# Patient Record
Sex: Male | Born: 1945 | Race: White | Hispanic: No | Marital: Married | Smoking: Never smoker
Health system: Southern US, Community
[De-identification: ages and names within clinical notes are randomized; demographics above are authoritative.]

## PROBLEM LIST (undated history)

## (undated) DIAGNOSIS — R569 Unspecified convulsions: Secondary | ICD-10-CM

## (undated) DIAGNOSIS — U071 COVID-19: Secondary | ICD-10-CM

## (undated) DIAGNOSIS — F0280 Dementia in other diseases classified elsewhere without behavioral disturbance: Secondary | ICD-10-CM

## (undated) DIAGNOSIS — I10 Essential (primary) hypertension: Secondary | ICD-10-CM

## (undated) DIAGNOSIS — E7800 Pure hypercholesterolemia, unspecified: Secondary | ICD-10-CM

## (undated) HISTORY — PX: MANDIBLE SURGERY: SHX707

## (undated) HISTORY — PX: KNEE SURGERY: SHX244

---

## 2020-04-25 ENCOUNTER — Other Ambulatory Visit: Payer: Self-pay

## 2020-04-25 ENCOUNTER — Encounter (HOSPITAL_COMMUNITY): Payer: Self-pay | Admitting: Emergency Medicine

## 2020-04-25 ENCOUNTER — Inpatient Hospital Stay (HOSPITAL_COMMUNITY)
Admission: EM | Admit: 2020-04-25 | Discharge: 2020-04-29 | DRG: 177 | Disposition: A | Discharge status: Home / Self Care | Payer: Medicare Other | Attending: Internal Medicine | Admitting: Internal Medicine

## 2020-04-25 ENCOUNTER — Emergency Department (HOSPITAL_COMMUNITY): Payer: Medicare Other

## 2020-04-25 DIAGNOSIS — G929 Unspecified toxic encephalopathy: Secondary | ICD-10-CM | POA: Diagnosis present

## 2020-04-25 DIAGNOSIS — E860 Dehydration: Secondary | ICD-10-CM | POA: Diagnosis present

## 2020-04-25 DIAGNOSIS — U071 COVID-19: Secondary | ICD-10-CM | POA: Diagnosis present

## 2020-04-25 DIAGNOSIS — F0280 Dementia in other diseases classified elsewhere without behavioral disturbance: Secondary | ICD-10-CM | POA: Diagnosis present

## 2020-04-25 DIAGNOSIS — J1282 Pneumonia due to coronavirus disease 2019: Secondary | ICD-10-CM | POA: Diagnosis present

## 2020-04-25 DIAGNOSIS — N179 Acute kidney failure, unspecified: Secondary | ICD-10-CM | POA: Diagnosis present

## 2020-04-25 DIAGNOSIS — J9601 Acute respiratory failure with hypoxia: Secondary | ICD-10-CM

## 2020-04-25 DIAGNOSIS — I10 Essential (primary) hypertension: Secondary | ICD-10-CM | POA: Diagnosis present

## 2020-04-25 DIAGNOSIS — R0602 Shortness of breath: Secondary | ICD-10-CM

## 2020-04-25 DIAGNOSIS — G309 Alzheimer's disease, unspecified: Secondary | ICD-10-CM | POA: Diagnosis present

## 2020-04-25 DIAGNOSIS — E7800 Pure hypercholesterolemia, unspecified: Secondary | ICD-10-CM | POA: Diagnosis present

## 2020-04-25 DIAGNOSIS — J159 Unspecified bacterial pneumonia: Secondary | ICD-10-CM | POA: Diagnosis present

## 2020-04-25 DIAGNOSIS — Z66 Do not resuscitate: Secondary | ICD-10-CM | POA: Diagnosis present

## 2020-04-25 HISTORY — DX: COVID-19: U07.1

## 2020-04-25 HISTORY — DX: Essential (primary) hypertension: I10

## 2020-04-25 HISTORY — DX: Dementia in other diseases classified elsewhere, unspecified severity, without behavioral disturbance, psychotic disturbance, mood disturbance, and anxiety: F02.80

## 2020-04-25 HISTORY — DX: Pure hypercholesterolemia, unspecified: E78.00

## 2020-04-25 LAB — CBC WITH DIFFERENTIAL/PLATELET
Abs Immature Granulocytes: 0.04 10*3/uL (ref 0.00–0.07)
Basophils Absolute: 0 10*3/uL (ref 0.0–0.1)
Basophils Relative: 0 %
Eosinophils Absolute: 0.1 10*3/uL (ref 0.0–0.5)
Eosinophils Relative: 1 %
HCT: 42.7 % (ref 39.0–52.0)
Hemoglobin: 14.2 g/dL (ref 13.0–17.0)
Immature Granulocytes: 0 %
Lymphocytes Relative: 7 %
Lymphs Abs: 0.7 10*3/uL (ref 0.7–4.0)
MCH: 29.9 pg (ref 26.0–34.0)
MCHC: 33.3 g/dL (ref 30.0–36.0)
MCV: 89.9 fL (ref 80.0–100.0)
Monocytes Absolute: 1.1 10*3/uL — ABNORMAL HIGH (ref 0.1–1.0)
Monocytes Relative: 12 %
Neutro Abs: 7.4 10*3/uL (ref 1.7–7.7)
Neutrophils Relative %: 80 %
Platelets: 179 10*3/uL (ref 150–400)
RBC: 4.75 MIL/uL (ref 4.22–5.81)
RDW: 13.2 % (ref 11.5–15.5)
WBC: 9.4 10*3/uL (ref 4.0–10.5)
nRBC: 0 % (ref 0.0–0.2)

## 2020-04-25 LAB — COMPREHENSIVE METABOLIC PANEL
ALT: 31 U/L (ref 0–44)
AST: 29 U/L (ref 15–41)
Albumin: 3.7 g/dL (ref 3.5–5.0)
Alkaline Phosphatase: 59 U/L (ref 38–126)
Anion gap: 8 (ref 5–15)
BUN: 16 mg/dL (ref 8–23)
CO2: 25 mmol/L (ref 22–32)
Calcium: 8.6 mg/dL — ABNORMAL LOW (ref 8.9–10.3)
Chloride: 102 mmol/L (ref 98–111)
Creatinine, Ser: 1.57 mg/dL — ABNORMAL HIGH (ref 0.61–1.24)
GFR, Estimated: 46 mL/min — ABNORMAL LOW (ref >60)
Glucose, Bld: 113 mg/dL — ABNORMAL HIGH (ref 70–99)
Potassium: 3.6 mmol/L (ref 3.5–5.1)
Sodium: 135 mmol/L (ref 135–145)
Total Bilirubin: 1.4 mg/dL — ABNORMAL HIGH (ref 0.3–1.2)
Total Protein: 6.2 g/dL — ABNORMAL LOW (ref 6.5–8.1)

## 2020-04-25 MED ORDER — ACETAMINOPHEN 650 MG RE SUPP
650.0000 mg | Freq: Four times a day (QID) | RECTAL | Status: DC | PRN
Start: 2020-04-25 — End: 2020-04-29

## 2020-04-25 MED ORDER — SODIUM CHLORIDE 0.9 % IV BOLUS
1000.0000 mL | Freq: Once | INTRAVENOUS | Status: AC
  Administered 2020-04-25: 1000 mL via INTRAVENOUS

## 2020-04-25 MED ORDER — ENOXAPARIN SODIUM 40 MG/0.4ML ~~LOC~~ SOLN
40.0000 mg | SUBCUTANEOUS | Status: DC
  Administered 2020-04-26 – 2020-04-29 (×4): 40 mg via SUBCUTANEOUS
  Filled 2020-04-25 (×5): qty 0.4

## 2020-04-25 MED ORDER — DEXAMETHASONE SODIUM PHOSPHATE 10 MG/ML IJ SOLN
10.0000 mg | Freq: Once | INTRAMUSCULAR | Status: AC
  Administered 2020-04-26: 10 mg via INTRAVENOUS
  Filled 2020-04-25: qty 1

## 2020-04-25 MED ORDER — DEXAMETHASONE 4 MG PO TABS
6.0000 mg | ORAL_TABLET | ORAL | Status: DC
  Administered 2020-04-26 – 2020-04-29 (×4): 6 mg via ORAL
  Filled 2020-04-25 (×4): qty 2

## 2020-04-25 MED ORDER — ACETAMINOPHEN 325 MG PO TABS
650.0000 mg | ORAL_TABLET | Freq: Four times a day (QID) | ORAL | Status: DC | PRN
  Administered 2020-04-26: 650 mg via ORAL
  Filled 2020-04-25: qty 2

## 2020-04-25 NOTE — H&P (Signed)
History and Physical    Jonathan Berry MGQ:[id number] DOB: [birth year] DOA: 04/25/2020  PCP: Pcp, No  Patient coming from: Home.  Chief Complaint: Cough and confusion.  History obtained from patient's wife.  HPI: Jonathan Berry is a 75 y.o. male with history of hypertension, hyperlipidemia, dementia was brought to the ER after patient was becoming increasingly confused weak and increasing cough.  Patient symptoms started about 2 days ago with cough which progressively worsened start developing some hypoxia.  Patient also had mild fever with temperatures around 99 F.  Yesterday patient was checked for Covid test by patient's daughter at home and was positive.  Patient had visit with primary care who started patient on Paxlovid first dose of which was taken this morning.  Following which patient has become gradually more weak had 1 episode nausea vomiting and becoming more confused at this point family decided to bring patient to the hospital.  ED Course: In the ER patient appeared confused chest x-ray shows bilateral infiltrates consistent with pneumonia concerning for Covid infection.  Covid test is still pending and we do not have any documentation from the outpatient test.  Labs are significant for creatinine of 1.5 which increased from being normal about September 2021 in the Care Everywhere.  CRP is 14.3 procalcitonin is 18.9 and D-dimer is 1.8.  Patient was empirically started on Decadron remdesivir and also we will keep patient antibiotics since procalcitonin is elevated patient was given fluid bolus because of low normal blood pressure and acute renal failure.  Review of Systems: As per HPI, rest all negative.   Past Medical History:  Diagnosis Date  . Alzheimer disease (HCC)   . COVID   . Hypercholesteremia   . Hypertension     Past Surgical History:  Procedure Laterality Date  . KNEE SURGERY    . MANDIBLE SURGERY       reports that he has never smoked. He has never used smokeless  tobacco. He reports current alcohol use. He reports that he does not use drugs.  No Known Allergies  Family History  Problem Relation Age of Onset  . Diabetes Mellitus II Neg Hx     Prior to Admission medications   Not on File    Physical Exam: Constitutional: Moderately built and nourished. Vitals:   04/25/20 2223 04/25/20 2224 04/25/20 2225 04/25/20 2230  BP:    92/66  Pulse: 77 74 80 76  Resp:    16  Temp:      TempSrc:      SpO2: (!) 89% (!) 89% (!) 87% 91%  Weight:      Height:       Eyes: Anicteric no pallor. ENMT: No discharge from the ears eyes nose or mouth. Neck: No mass felt.  No neck rigidity. Respiratory: No rhonchi or crepitations. Cardiovascular: S1-S2 heard. Abdomen: Soft nontender bowel sounds present. Musculoskeletal: No edema. Skin: No rash. Neurologic: Alert awake oriented to his name moving all extremities. Psychiatric: Appears confused.   Labs on Admission: I have personally reviewed following labs and imaging studies  CBC: Recent Labs  Lab 04/25/20 2045  WBC 9.4  NEUTROABS 7.4  HGB 14.2  HCT 42.7  MCV 89.9  PLT 179   Basic Metabolic Panel: Recent Labs  Lab 04/25/20 2045  NA 135  K 3.6  CL 102  CO2 25  GLUCOSE 113*  BUN 16  CREATININE 1.57*  CALCIUM 8.6*   GFR: Estimated Creatinine Clearance: 41.3 mL/min (A) (by C-G formula based on  SCr of 1.57 mg/dL (H)). Liver Function Tests: Recent Labs  Lab 04/25/20 2045  AST 29  ALT 31  ALKPHOS 59  BILITOT 1.4*  PROT 6.2*  ALBUMIN 3.7   No results for input(s): LIPASE, AMYLASE in the last 168 hours. No results for input(s): AMMONIA in the last 168 hours. Coagulation Profile: No results for input(s): INR, PROTIME in the last 168 hours. Cardiac Enzymes: No results for input(s): CKTOTAL, CKMB, CKMBINDEX, TROPONINI in the last 168 hours. BNP (last 3 results) No results for input(s): PROBNP in the last 8760 hours. HbA1C: No results for input(s): HGBA1C in the last 72  hours. CBG: No results for input(s): GLUCAP in the last 168 hours. Lipid Profile: No results for input(s): CHOL, HDL, LDLCALC, TRIG, CHOLHDL, LDLDIRECT in the last 72 hours. Thyroid Function Tests: No results for input(s): TSH, T4TOTAL, FREET4, T3FREE, THYROIDAB in the last 72 hours. Anemia Panel: No results for input(s): VITAMINB12, FOLATE, FERRITIN, TIBC, IRON, RETICCTPCT in the last 72 hours. Urine analysis: No results found for: COLORURINE, APPEARANCEUR, LABSPEC, PHURINE, GLUCOSEU, HGBUR, BILIRUBINUR, KETONESUR, PROTEINUR, UROBILINOGEN, NITRITE, LEUKOCYTESUR Sepsis Labs: @LABRCNTIP (procalcitonin:4,lacticidven:4) )No results found for this or any previous visit (from the past 240 hour(s)).   Radiological Exams on Admission: DG Chest Portable 1 View  Result Date: 04/25/2020 CLINICAL DATA:  Cough and fever.  COVID positive yesterday. EXAM: PORTABLE CHEST 1 VIEW COMPARISON:  None. FINDINGS: Low lung volumes. Patchy heterogeneous bilateral airspace opacities in a mid-lower lung zone predominant distribution. Heart is normal in size with normal mediastinal contours. Aortic atherosclerosis. No pleural effusion, pneumothorax, or evidence of pneumomediastinum. No acute osseous abnormalities are seen. IMPRESSION: Patchy heterogeneous bilateral airspace opacities in a mid-lower lung zone predominant distribution, pattern typical of COVID-19. Electronically Signed   By: 04/27/2020 M.D.   On: 04/25/2020 20:24    EKG: Independently reviewed.  Normal sinus rhythm.  Assessment/Plan Principal Problem:   Pneumonia due to COVID-19 virus Active Problems:   ARF (acute renal failure) (HCC)   DNR (do not resuscitate)   Essential hypertension    1. Pneumonia likely from Covid Covid test is still pending but as per the family outpatient test was positive.  Patient chest x-ray shows bilateral infiltrates for which patient has been already started on Decadron and remdesivir.  Presently oxygen sats are  more than 90% on room air but sometimes desats.  Since patient's procalcitonin is also elevated we will keep patient on empiric antibiotics.  Continue to closely monitor.  Follow inflammatory markers and respiratory status.  Discussed with patient's wife that if patient's oxygen saturation worsens may need Actemra. 2. Acute renal failure likely from dehydration low normal blood pressure will hold off patient's lisinopril for now and we will closely monitor intake output metabolic panel patient did receive 1 L fluid bolus in the ER. 3. Hypertension blood pressures in the low normal's will hold antihypertensives due to low normal blood pressure and also acute renal failure. 4. History of Alzheimer's dementia with encephalopathy likely from Covid infection/pneumonia and metabolic reasons.  Presently on Aricept.  Per family patient symptoms have improved after receiving fluids in the ER.  Since patient has bilateral pneumonia with acute renal failure encephalopathy will need close monitoring for any further worsening in inpatient status.   DVT prophylaxis: Lovenox. Code Status: DNR confirmed with patient's wife. Family Communication: Patient's wife. Disposition Plan: Home. Consults called: None. Admission status: Inpatient.   04/27/2020 MD [facility] Pager [phone].  If 7PM-7AM, please contact night-coverage  [email or web address] Password [facility]  04/25/2020, 11:27 PM

## 2020-04-25 NOTE — ED Triage Notes (Signed)
Patient tested positive for Covid19 yesterday , dry cough with fever and emesis this week . He completed his Covid vaccination with booster , 1st dose of oral Paxlovid tab this morning . Respirations unlabored .

## 2020-04-25 NOTE — ED Provider Notes (Signed)
[facility] EMERGENCY DEPARTMENT Provider Note   CSN: [id number] Arrival date & time: 04/25/20  1937     History Chief Complaint  Patient presents with  . Covid Fever    Dry Cough    Jonathan Berry is a 75 y.o. male.  74 yo M with a chief complaints of confusion fever and cough.  This been going on for a few days now.  Patient had a home test for COVID that was positive.  Has been eating and drinking some but does not seem to drink a lot at baseline.  Fever as high as 104 yesterday.  Seems to have times where he is better and sometimes when he is worse.  Oxygen checked at home no lower than 91.  Has had some difficulty exerting himself.  Has Alzheimer's and so is not able to provide much personal history.  Level 5 caveat.        Past Medical History:  Diagnosis Date  . Alzheimer disease (HCC)   . COVID   . Hypercholesteremia   . Hypertension     Patient Active Problem List   Diagnosis Date Noted  . Pneumonia due to COVID-19 virus 04/25/2020  . ARF (acute renal failure) (HCC) 04/25/2020  . DNR (do not resuscitate) 04/25/2020  . Essential hypertension 04/25/2020    Past Surgical History:  Procedure Laterality Date  . KNEE SURGERY    . MANDIBLE SURGERY         Family History  Problem Relation Age of Onset  . Diabetes Mellitus II Neg Hx     Social History   Tobacco Use  . Smoking status: Never Smoker  . Smokeless tobacco: Never Used  Substance Use Topics  . Alcohol use: Yes    Comment: 3 glasses of wine a week  . Drug use: Never    Home Medications Prior to Admission medications   Medication Sig Start Date End Date Taking? Authorizing Provider  acetaminophen (TYLENOL) 500 MG tablet Take 1,000 mg by mouth every 6 (six) hours as needed for fever.   Yes [provider]  Acetylcarnitine HCl (ACETYL-L-CARNITINE HCL) POWD 1 capsule by Does not apply route daily.   Yes [provider]  Alpha-Lipoic Acid 100 MG TABS Take 300 mg  by mouth daily.   Yes [provider]  atorvastatin (LIPITOR) 20 MG tablet Take 20 mg by mouth daily. 12/02/19  Yes [provider]  Cholecalciferol (VITAMIN D3) 125 MCG (5000 UT) TABS Take 5,000 Units by mouth daily.   Yes [provider]  donepezil (ARICEPT) 10 MG tablet Take 10 mg by mouth daily. 03/27/20  Yes [provider]  ibuprofen (ADVIL) 200 MG tablet Take 400 mg by mouth every 6 (six) hours as needed for fever.   Yes [provider]  L-METHYLFOLATE PO Take 1 tablet by mouth daily.   Yes [provider]  lisinopril (ZESTRIL) 5 MG tablet Take 5 mg by mouth daily. 12/02/19  Yes [provider]  Multiple Vitamins-Minerals (CENTRUM SILVER PO) Take 1 tablet by mouth daily.   Yes [provider]  Nirmatrelvir & Ritonavir (PAXLOVID) 20 x 150 MG & 10 x 100MG  TBPK Take 3 tablets by mouth See admin instructions. Bid x 5 days 04/25/20 04/30/20 Yes [provider]  OVER THE COUNTER MEDICATION Take 1 tablet by mouth daily. Biomethyl folate complex   Yes [provider]  OVER THE COUNTER MEDICATION Take 1 tablet by mouth daily. Circumin complex   Yes  [provider]  OVER THE COUNTER MEDICATION Take 1 drop by mouth daily. B12 methylcobalamin 1,000 liquid   Yes [provider]    Allergies    Patient has no known allergies.  Review of Systems   Review of Systems  Constitutional: Positive for chills and fever.  HENT: Negative for congestion and facial swelling.   Eyes: Negative for discharge and visual disturbance.  Respiratory: Positive for cough and shortness of breath.   Cardiovascular: Negative for chest pain and palpitations.  Gastrointestinal: Positive for nausea and vomiting. Negative for abdominal pain and diarrhea.  Musculoskeletal: Negative for arthralgias and myalgias.  Skin: Negative for color change and rash.  Neurological: Negative for tremors, syncope and headaches.   Psychiatric/Behavioral: Negative for confusion and dysphoric mood.    Physical Exam Updated Vital Signs BP 116/67   Pulse 70   Temp 98.6 F (37 C) (Oral)   Resp 19   Ht  (1.753 m)   Wt 75 kg   SpO2 95%   BMI 24.42 kg/m   Physical Exam Vitals and nursing note reviewed.  Constitutional:      Appearance: He is well-developed.  HENT:     Head: Normocephalic and atraumatic.  Eyes:     Pupils: Pupils are equal, round, and reactive to light.  Neck:     Vascular: No JVD.  Cardiovascular:     Rate and Rhythm: Normal rate and regular rhythm.     Heart sounds: No murmur heard. No friction rub. No gallop.   Pulmonary:     Effort: No respiratory distress.     Breath sounds: No wheezing.     Comments: Coarse breath sounds in all fields Abdominal:     General: There is no distension.     Tenderness: There is no abdominal tenderness. There is no guarding or rebound.  Musculoskeletal:        General: Normal range of motion.     Cervical back: Normal range of motion and neck supple.  Skin:    Coloration: Skin is not pale.     Findings: No rash.  Neurological:     Mental Status: He is alert and oriented to person, place, and time.  Psychiatric:        Behavior: Behavior normal.     ED Results / Procedures / Treatments   Labs (all labs ordered are listed, but only abnormal results are displayed) Labs Reviewed  RESP PANEL BY RT-PCR (FLU A&B, COVID) ARPGX2 - Abnormal; Notable for the following components:      Result Value   SARS Coronavirus 2 by RT PCR POSITIVE (*)    All other components within normal limits  CBC WITH DIFFERENTIAL/PLATELET - Abnormal; Notable for the following components:   Monocytes Absolute 1.1 (*)    All other components within normal limits  COMPREHENSIVE METABOLIC PANEL - Abnormal; Notable for the following components:   Glucose, Bld 113 (*)    Creatinine, Ser 1.57 (*)    Calcium 8.6 (*)    Total Protein 6.2 (*)    Total Bilirubin 1.4 (*)     GFR, Estimated 46 (*)    All other components within normal limits  D-DIMER, QUANTITATIVE - Abnormal; Notable for the following components:   D-Dimer, Quant 1.83 (*)    All other components within normal limits  LACTATE DEHYDROGENASE - Abnormal; Notable for the following components:   LDH 197 (*)    All other components within normal limits  FIBRINOGEN - Abnormal; Notable  for the following components:   Fibrinogen 543 (*)    All other components within normal limits  C-REACTIVE PROTEIN - Abnormal; Notable for the following components:   CRP 14.3 (*)    All other components within normal limits  COMPREHENSIVE METABOLIC PANEL - Abnormal; Notable for the following components:   Glucose, Bld 113 (*)    Creatinine, Ser 1.29 (*)    Calcium 8.6 (*)    Total Protein 6.0 (*)    Albumin 3.4 (*)    Total Bilirubin 1.4 (*)    GFR, Estimated 58 (*)    All other components within normal limits  C-REACTIVE PROTEIN - Abnormal; Notable for the following components:   CRP 16.0 (*)    All other components within normal limits  D-DIMER, QUANTITATIVE - Abnormal; Notable for the following components:   D-Dimer, Quant 2.27 (*)    All other components within normal limits  TROPONIN I (HIGH SENSITIVITY) - Abnormal; Notable for the following components:   Troponin I (High Sensitivity) 35 (*)    All other components within normal limits  TROPONIN I (HIGH SENSITIVITY) - Abnormal; Notable for the following components:   Troponin I (High Sensitivity) 33 (*)    All other components within normal limits  CULTURE, BLOOD (ROUTINE X 2)  CULTURE, BLOOD (ROUTINE X 2)  MRSA PCR SCREENING  URINALYSIS, ROUTINE W REFLEX MICROSCOPIC  LACTIC ACID, PLASMA  PROCALCITONIN  FERRITIN  TRIGLYCERIDES  CBC WITH DIFFERENTIAL/PLATELET  LACTIC ACID, PLASMA  HIV ANTIBODY (ROUTINE TESTING W REFLEX)  BRAIN NATRIURETIC PEPTIDE  MAGNESIUM  PROCALCITONIN    EKG None  Radiology DG Chest Port 1 View  Result Date:  04/26/2020 CLINICAL DATA:  Dyspnea, COVID pneumonia EXAM: PORTABLE CHEST 1 VIEW COMPARISON:  None. FINDINGS: Pulmonary insufflation is normal and symmetric. Left basilar focal pulmonary infiltrate has improved slightly in the interval. No pneumothorax or pleural effusion. Cardiac size within normal limits. Pulmonary vascularity is normal. IMPRESSION: Improving left basilar infiltrate Electronically Signed   By: Helyn Numbers MD   On: 04/26/2020 06:49   DG Chest Portable 1 View  Result Date: 04/25/2020 CLINICAL DATA:  Cough and fever.  COVID positive yesterday. EXAM: PORTABLE CHEST 1 VIEW COMPARISON:  None. FINDINGS: Low lung volumes. Patchy heterogeneous bilateral airspace opacities in a mid-lower lung zone predominant distribution. Heart is normal in size with normal mediastinal contours. Aortic atherosclerosis. No pleural effusion, pneumothorax, or evidence of pneumomediastinum. No acute osseous abnormalities are seen. IMPRESSION: Patchy heterogeneous bilateral airspace opacities in a mid-lower lung zone predominant distribution, pattern typical of COVID-19. Electronically Signed   By: Narda Rutherford M.D.   On: 04/25/2020 20:24    Procedures Procedures   Medications Ordered in ED Medications  enoxaparin (LOVENOX) injection 40 mg (40 mg Subcutaneous Given 04/26/20 1029)  acetaminophen (TYLENOL) tablet 650 mg (has no administration in time range)    Or  acetaminophen (TYLENOL) suppository 650 mg (has no administration in time range)  dexamethasone (DECADRON) tablet 6 mg (6 mg Oral Given 04/26/20 1023)  azithromycin (ZITHROMAX) 500 mg in sodium chloride 0.9 % 250 mL IVPB ( Intravenous Infusion Verify 04/26/20 0600)  donepezil (ARICEPT) tablet 10 mg (has no administration in time range)  remdesivir 200 mg in sodium chloride 0.9% 250 mL IVPB (has no administration in time range)    Followed by  remdesivir 100 mg in sodium chloride 0.9 % 100 mL IVPB (has no administration in time range)   haloperidol lactate (HALDOL) injection 5 mg (5 mg Intravenous Given  04/26/20 1026)  lactated ringers infusion (has no administration in time range)  atorvastatin (LIPITOR) tablet 20 mg (20 mg Oral Given 04/26/20 1158)  Nirmatrelvir & Ritonavir 20 x 150 MG & 10 x 100MG  TBPK 3 tablet (has no administration in time range)  aspirin chewable tablet 81 mg (81 mg Oral Given 04/26/20 1157)  magnesium sulfate IVPB 2 g 50 mL (has no administration in time range)  pantoprazole (PROTONIX) EC tablet 40 mg (40 mg Oral Given 04/26/20 1158)  Ampicillin-Sulbactam (UNASYN) 3 g in sodium chloride 0.9 % 100 mL IVPB (3 g Intravenous New Bag/Given 04/26/20 1157)  sodium chloride 0.9 % bolus 1,000 mL (0 mLs Intravenous Stopped 04/25/20 2328)  dexamethasone (DECADRON) injection 10 mg (10 mg Intravenous Given 04/26/20 0157)  potassium chloride SA (KLOR-CON) CR tablet 40 mEq (40 mEq Oral Given 04/26/20 1022)    ED Course  I have reviewed the triage vital signs and the nursing notes.  Pertinent labs & imaging results that were available during my care of the patient were reviewed by me and considered in my medical decision making (see chart for details).    MDM Rules/Calculators/A&P                          75 yo M with a chief complaints of cough fever and confusion.  Going on for a couple days now.  Had a positive outpatient test for COVID-19.  Was started on Paxlovid first 2 doses today.  Patient is hypertensive on arrival here.  We will give a bolus of IV fluids.  Check blood work.  Chest x-ray viewed by me consistent with multifocal pneumonia.  Hypoxic spontaneously at rest.  Given fluids with some improvement of BP.   Will discuss with Medicine.    CRITICAL CARE Performed by: 66   Total critical care time: 35 minutes  Critical care time was exclusive of separately billable procedures and treating other patients.  Critical care was necessary to treat or prevent imminent or  life-threatening deterioration.  Critical care was time spent personally by me on the following activities: development of treatment plan with patient and/or surrogate as well as nursing, discussions with consultants, evaluation of patient's response to treatment, examination of patient, obtaining history from patient or surrogate, ordering and performing treatments and interventions, ordering and review of laboratory studies, ordering and review of radiographic studies, pulse oximetry and re-evaluation of patient's condition.  The patients results and plan were reviewed and discussed.   Any x-rays performed were independently reviewed by myself.   Differential diagnosis were considered with the presenting HPI.  Medications  enoxaparin (LOVENOX) injection 40 mg (40 mg Subcutaneous Given 04/26/20 1029)  acetaminophen (TYLENOL) tablet 650 mg (has no administration in time range)    Or  acetaminophen (TYLENOL) suppository 650 mg (has no administration in time range)  dexamethasone (DECADRON) tablet 6 mg (6 mg Oral Given 04/26/20 1023)  azithromycin (ZITHROMAX) 500 mg in sodium chloride 0.9 % 250 mL IVPB ( Intravenous Infusion Verify 04/26/20 0600)  donepezil (ARICEPT) tablet 10 mg (has no administration in time range)  remdesivir 200 mg in sodium chloride 0.9% 250 mL IVPB (has no administration in time range)    Followed by  remdesivir 100 mg in sodium chloride 0.9 % 100 mL IVPB (has no administration in time range)  haloperidol lactate (HALDOL) injection 5 mg (5 mg Intravenous Given 04/26/20 1026)  lactated ringers infusion (has no administration in time range)  atorvastatin (LIPITOR) tablet 20 mg (20 mg Oral Given 04/26/20 1158)  Nirmatrelvir & Ritonavir 20 x 150 MG & 10 x 100MG  TBPK 3 tablet (has no administration in time range)  aspirin chewable tablet 81 mg (81 mg Oral Given 04/26/20 1157)  magnesium sulfate IVPB 2 g 50 mL (has no administration in time range)  pantoprazole (PROTONIX) EC tablet  40 mg (40 mg Oral Given 04/26/20 1158)  Ampicillin-Sulbactam (UNASYN) 3 g in sodium chloride 0.9 % 100 mL IVPB (3 g Intravenous New Bag/Given 04/26/20 1157)  sodium chloride 0.9 % bolus 1,000 mL (0 mLs Intravenous Stopped 04/25/20 2328)  dexamethasone (DECADRON) injection 10 mg (10 mg Intravenous Given 04/26/20 0157)  potassium chloride SA (KLOR-CON) CR tablet 40 mEq (40 mEq Oral Given 04/26/20 1022)    Vitals:   04/26/20 0300 04/26/20 0400 04/26/20 0600 04/26/20 0948  BP: 107/88 121/73  116/67  Pulse: 66 63  70  Resp: (!) 22   19  Temp:  99.1 F (37.3 C)  98.6 F (37 C)  TempSrc:  Oral  Oral  SpO2: 91% 91% 93% 95%  Weight:      Height:        Final diagnoses:  Pneumonia due to COVID-19 virus  Acute respiratory failure with hypoxia (HCC)    Admission/ observation were discussed with the admitting physician, patient and/or family and they are comfortable with the plan.    Final Clinical Impression(s) / ED Diagnoses Final diagnoses:  Pneumonia due to COVID-19 virus  Acute respiratory failure with hypoxia [facility])    Rx / DC Orders ED Discharge Orders    None       [facility], DO 04/26/20 1500

## 2020-04-26 ENCOUNTER — Inpatient Hospital Stay (HOSPITAL_COMMUNITY): Payer: Medicare Other

## 2020-04-26 DIAGNOSIS — J1282 Pneumonia due to coronavirus disease 2019: Secondary | ICD-10-CM

## 2020-04-26 DIAGNOSIS — U071 COVID-19: Principal | ICD-10-CM

## 2020-04-26 LAB — TROPONIN I (HIGH SENSITIVITY)
Troponin I (High Sensitivity): 35 ng/L — ABNORMAL HIGH (ref <18)
Troponin I (High Sensitivity): 33 ng/L — ABNORMAL HIGH (ref <18)

## 2020-04-26 LAB — D-DIMER, QUANTITATIVE
D-Dimer, Quant: 1.83 ug/mL-FEU — ABNORMAL HIGH (ref 0.00–0.50)
D-Dimer, Quant: 2.27 ug/mL-FEU — ABNORMAL HIGH (ref 0.00–0.50)

## 2020-04-26 LAB — URINALYSIS, ROUTINE W REFLEX MICROSCOPIC
Bilirubin Urine: NEGATIVE
Glucose, UA: NEGATIVE mg/dL
Hgb urine dipstick: NEGATIVE
Ketones, ur: NEGATIVE mg/dL
Leukocytes,Ua: NEGATIVE
Nitrite: NEGATIVE
Protein, ur: NEGATIVE mg/dL
Specific Gravity, Urine: 1.015 (ref 1.005–1.030)
pH: 5 (ref 5.0–8.0)

## 2020-04-26 LAB — CBC WITH DIFFERENTIAL/PLATELET
Abs Immature Granulocytes: 0.03 10*3/uL (ref 0.00–0.07)
Basophils Absolute: 0 10*3/uL (ref 0.0–0.1)
Basophils Relative: 0 %
Eosinophils Absolute: 0 10*3/uL (ref 0.0–0.5)
Eosinophils Relative: 0 %
HCT: 40.8 % (ref 39.0–52.0)
Hemoglobin: 13.6 g/dL (ref 13.0–17.0)
Immature Granulocytes: 0 %
Lymphocytes Relative: 12 %
Lymphs Abs: 0.9 10*3/uL (ref 0.7–4.0)
MCH: 30.1 pg (ref 26.0–34.0)
MCHC: 33.3 g/dL (ref 30.0–36.0)
MCV: 90.3 fL (ref 80.0–100.0)
Monocytes Absolute: 0.7 10*3/uL (ref 0.1–1.0)
Monocytes Relative: 9 %
Neutro Abs: 6 10*3/uL (ref 1.7–7.7)
Neutrophils Relative %: 79 %
Platelets: 179 10*3/uL (ref 150–400)
RBC: 4.52 MIL/uL (ref 4.22–5.81)
RDW: 13.3 % (ref 11.5–15.5)
WBC: 7.7 10*3/uL (ref 4.0–10.5)
nRBC: 0 % (ref 0.0–0.2)

## 2020-04-26 LAB — RESP PANEL BY RT-PCR (FLU A&B, COVID) ARPGX2
Influenza A by PCR: NEGATIVE
Influenza B by PCR: NEGATIVE
SARS Coronavirus 2 by RT PCR: POSITIVE — AB

## 2020-04-26 LAB — COMPREHENSIVE METABOLIC PANEL
ALT: 31 U/L (ref 0–44)
AST: 32 U/L (ref 15–41)
Albumin: 3.4 g/dL — ABNORMAL LOW (ref 3.5–5.0)
Alkaline Phosphatase: 49 U/L (ref 38–126)
Anion gap: 9 (ref 5–15)
BUN: 17 mg/dL (ref 8–23)
CO2: 27 mmol/L (ref 22–32)
Calcium: 8.6 mg/dL — ABNORMAL LOW (ref 8.9–10.3)
Chloride: 103 mmol/L (ref 98–111)
Creatinine, Ser: 1.29 mg/dL — ABNORMAL HIGH (ref 0.61–1.24)
GFR, Estimated: 58 mL/min — ABNORMAL LOW (ref >60)
Glucose, Bld: 113 mg/dL — ABNORMAL HIGH (ref 70–99)
Potassium: 3.8 mmol/L (ref 3.5–5.1)
Sodium: 139 mmol/L (ref 135–145)
Total Bilirubin: 1.4 mg/dL — ABNORMAL HIGH (ref 0.3–1.2)
Total Protein: 6 g/dL — ABNORMAL LOW (ref 6.5–8.1)

## 2020-04-26 LAB — LACTIC ACID, PLASMA
Lactic Acid, Venous: 1.7 mmol/L (ref 0.5–1.9)
Lactic Acid, Venous: 1.5 mmol/L (ref 0.5–1.9)

## 2020-04-26 LAB — BRAIN NATRIURETIC PEPTIDE: B Natriuretic Peptide: 92.8 pg/mL (ref 0.0–100.0)

## 2020-04-26 LAB — MAGNESIUM: Magnesium: 1.7 mg/dL (ref 1.7–2.4)

## 2020-04-26 LAB — HIV ANTIBODY (ROUTINE TESTING W REFLEX): HIV Screen 4th Generation wRfx: NONREACTIVE

## 2020-04-26 LAB — FIBRINOGEN: Fibrinogen: 543 mg/dL — ABNORMAL HIGH (ref 210–475)

## 2020-04-26 LAB — FERRITIN: Ferritin: 315 ng/mL (ref 24–336)

## 2020-04-26 LAB — LACTATE DEHYDROGENASE: LDH: 197 U/L — ABNORMAL HIGH (ref 98–192)

## 2020-04-26 LAB — C-REACTIVE PROTEIN
CRP: 14.3 mg/dL — ABNORMAL HIGH (ref <1.0)
CRP: 16 mg/dL — ABNORMAL HIGH (ref <1.0)

## 2020-04-26 LAB — TRIGLYCERIDES: Triglycerides: 37 mg/dL (ref <150)

## 2020-04-26 LAB — PROCALCITONIN
Procalcitonin: 18.99 ng/mL
Procalcitonin: 21.14 ng/mL

## 2020-04-26 MED ORDER — SODIUM CHLORIDE 0.9 % IV SOLN
100.0000 mg | Freq: Every day | INTRAVENOUS | Status: DC
  Administered 2020-04-27 – 2020-04-29 (×3): 100 mg via INTRAVENOUS
  Filled 2020-04-26 (×3): qty 20

## 2020-04-26 MED ORDER — MAGNESIUM SULFATE 2 GM/50ML IV SOLN
2.0000 g | Freq: Once | INTRAVENOUS | Status: AC
  Administered 2020-04-26: 2 g via INTRAVENOUS
  Filled 2020-04-26: qty 50

## 2020-04-26 MED ORDER — NIRMATRELVIR/RITONAVIR (PAXLOVID)TABLET
3.0000 | ORAL_TABLET | Freq: Two times a day (BID) | ORAL | Status: DC
  Administered 2020-04-28 – 2020-04-29 (×2): 3 via ORAL
  Filled 2020-04-26 (×4): qty 3

## 2020-04-26 MED ORDER — DONEPEZIL HCL 10 MG PO TABS
10.0000 mg | ORAL_TABLET | Freq: Every day | ORAL | Status: DC
  Administered 2020-04-26: 10 mg via ORAL
  Filled 2020-04-26 (×3): qty 1

## 2020-04-26 MED ORDER — ATORVASTATIN CALCIUM 10 MG PO TABS
20.0000 mg | ORAL_TABLET | Freq: Every day | ORAL | Status: DC
  Administered 2020-04-26 – 2020-04-29 (×4): 20 mg via ORAL
  Filled 2020-04-26 (×4): qty 2

## 2020-04-26 MED ORDER — PANTOPRAZOLE SODIUM 40 MG PO TBEC
40.0000 mg | DELAYED_RELEASE_TABLET | Freq: Every day | ORAL | Status: DC
  Administered 2020-04-26 – 2020-04-29 (×4): 40 mg via ORAL
  Filled 2020-04-26 (×4): qty 1

## 2020-04-26 MED ORDER — SODIUM CHLORIDE 0.9 % IV SOLN
500.0000 mg | Freq: Every day | INTRAVENOUS | Status: DC
  Administered 2020-04-26 – 2020-04-28 (×2): 500 mg via INTRAVENOUS
  Filled 2020-04-26 (×5): qty 500

## 2020-04-26 MED ORDER — LACTATED RINGERS IV SOLN: INTRAVENOUS | Status: DC

## 2020-04-26 MED ORDER — ASPIRIN 81 MG PO CHEW
81.0000 mg | CHEWABLE_TABLET | Freq: Every day | ORAL | Status: DC
  Administered 2020-04-26 – 2020-04-29 (×4): 81 mg via ORAL
  Filled 2020-04-26 (×4): qty 1

## 2020-04-26 MED ORDER — HALOPERIDOL LACTATE 5 MG/ML IJ SOLN
5.0000 mg | Freq: Four times a day (QID) | INTRAMUSCULAR | Status: DC | PRN
  Administered 2020-04-26 – 2020-04-28 (×4): 5 mg via INTRAVENOUS
  Filled 2020-04-26 (×4): qty 1

## 2020-04-26 MED ORDER — SODIUM CHLORIDE 0.9 % IV SOLN
2.0000 g | Freq: Every day | INTRAVENOUS | Status: DC
  Administered 2020-04-26: 2 g via INTRAVENOUS
  Filled 2020-04-26: qty 20

## 2020-04-26 MED ORDER — POTASSIUM CHLORIDE CRYS ER 20 MEQ PO TBCR
40.0000 meq | EXTENDED_RELEASE_TABLET | Freq: Once | ORAL | Status: AC
  Administered 2020-04-26: 40 meq via ORAL
  Filled 2020-04-26: qty 2

## 2020-04-26 MED ORDER — SODIUM CHLORIDE 0.9 % IV SOLN
3.0000 g | Freq: Four times a day (QID) | INTRAVENOUS | Status: DC
  Administered 2020-04-26 – 2020-04-29 (×9): 3 g via INTRAVENOUS
  Filled 2020-04-26: qty 8
  Filled 2020-04-26 (×3): qty 3
  Filled 2020-04-26: qty 8
  Filled 2020-04-26: qty 3
  Filled 2020-04-26 (×2): qty 8
  Filled 2020-04-26 (×3): qty 3
  Filled 2020-04-26 (×3): qty 8
  Filled 2020-04-26: qty 3
  Filled 2020-04-26: qty 8

## 2020-04-26 MED ORDER — SODIUM CHLORIDE 0.9 % IV SOLN
200.0000 mg | Freq: Once | INTRAVENOUS | Status: AC
  Administered 2020-04-26: 200 mg via INTRAVENOUS
  Filled 2020-04-26: qty 40

## 2020-04-26 MED ORDER — ALPHA-LIPOIC ACID 100 MG PO TABS: 300.0000 mg | ORAL_TABLET | Freq: Every day | ORAL | Status: DC

## 2020-04-26 NOTE — Progress Notes (Signed)
Pharmacy Antibiotic Note  Jonathan Berry is a 75 y.o. male admitted on 04/25/2020 with COVID-19 pneumonia treatment.  Pharmacy has been consulted for Unasyn for aspiration PNA.   Plan: Unasyn 3g IV q6h Monitor clinical progress, renal function, culture results, LOT, de-escalation.  Height: 5\' 9"  (175.3 cm) Weight: 75 kg (165 lb 5.5 oz) IBW/kg (Calculated) : 70.7  Temp (24hrs), Avg:99.1 F (37.3 C), Min:98.6 F (37 C), Max:99.7 F (37.6 C)  Recent Labs  Lab 04/25/20 2045 04/26/20 0208 04/26/20 0251 04/26/20 0434  WBC 9.4  --  7.7  --   CREATININE 1.57*  --  1.29*  --   LATICACIDVEN  --  1.7  --  1.5    Estimated Creatinine Clearance: 50.2 mL/min (A) (by C-G formula based on SCr of 1.29 mg/dL (H)).    No Known Allergies  Antimicrobials this admission: Redemsivir 4/14>> (4/18) Azithromycin 4/14>> Unasyn 4/14>>  Dose adjustments this admission:   Microbiology results: 4/14 BCX: collected 4/14 Covid: positive  (4/12 home test: positive)   Thank you for allowing pharmacy to be part of this patients care team. 03-08-1986, RPh Clinical Pharmacist [phone] Please check AMION for all [facility] Pharmacy phone numbers After 10:00 PM, call Main Pharmacy [phone]  04/26/2020 12:52 PM

## 2020-04-26 NOTE — Plan of Care (Signed)
Patient remains confused. Pulled out 3 Ivs today. Bed alarm and safety precautions maintained.

## 2020-04-26 NOTE — Evaluation (Signed)
Occupational Therapy Evaluation Patient Details Name: Jonathan Berry MRN: [id number] DOB: 05-Feb-1945 Today's Date: 04/26/2020    History of Present Illness Jonathan Berry is a 75 y.o. male with history of hypertension, hyperlipidemia, dementia was brought to the ER after patient was becoming increasingly confused weak and increasing cough. Pt positive for Covid-19.   Clinical Impression   Pt with hx of dementia, no family available to provide home setup information or pt's prior level of functioning. Pt reports he lives with his wife and was independent with ADL. Pt reports he enjoys golfing. Pt currently ambulating around the room independently without AD, demonstrated ability to complete ADL independently-supervision level for safety due to cognitive limitations. Anticipate pt is at or close to baseline. Attempted to call pt's wife Jonathan Berry via mobile number in pt's chart, no answer at this time. OT will sign off at this time. Should pt's status change, please re-order OT.   SpO2 >90% RA.     Follow Up Recommendations  Supervision/Assistance - 24 hour;No OT follow up    Equipment Recommendations  None recommended by OT    Recommendations for Other Services       Precautions / Restrictions Precautions Precautions: Fall Precaution Comments: airborn/contact, pt +Covid-19 Restrictions Weight Bearing Restrictions: No      Mobility Bed Mobility Overal bed mobility: Independent                  Transfers Overall transfer level: Independent               General transfer comment: ambulating in room without AD, no instability noted    Balance Overall balance assessment: Independent                                         ADL either performed or assessed with clinical judgement   ADL Overall ADL's : At baseline                                       General ADL Comments: per RN pt was getting himself dressing, pt able to complete  grooming at sink level, anticipate pt is at baseline level for ADL completion, requiring some supervision for safety     Vision         Perception     Praxis      Pertinent Vitals/Pain Pain Assessment: No/denies pain     Hand Dominance Right   Extremity/Trunk Assessment Upper Extremity Assessment Upper Extremity Assessment: Overall WFL for tasks assessed   Lower Extremity Assessment Lower Extremity Assessment: Overall WFL for tasks assessed   Cervical / Trunk Assessment Cervical / Trunk Assessment: Normal   Communication Communication Communication: No difficulties   Cognition Arousal/Alertness: Awake/alert Behavior During Therapy: WFL for tasks assessed/performed;Anxious Overall Cognitive Status: History of cognitive impairments - at baseline                                 General Comments: pt with hx of dementia, unsure pt's baseline cognition. Pt oriented to self. was not aware he was in a hospital, did not know the year. Pt anxious about seeing his wife, appeared frustrated at times with NT requesting to complete bladder scan. did not show any frustration toward male therapist  or male RN   General Comments  SpO2 >90% RA with exertion    Exercises     Shoulder Instructions      Home Living Family/patient expects to be discharged to:: Private residence                                 Additional Comments: pt unable to provide home setup information due to hx of dementia, unable to research family at this time. Pt reports he lives with his wife      Prior Functioning/Environment Level of Independence: Independent        Comments: pt reports he was independent with all ADL, enjoys golfing        OT Problem List: Cardiopulmonary status limiting activity      OT Treatment/Interventions:      OT Goals(Current goals can be found in the care plan section) Acute Rehab OT Goals Patient Stated Goal: to see his wife OT Goal  Formulation: With patient Time For Goal Achievement: 05/10/20 Potential to Achieve Goals: Good  OT Frequency:     Barriers to D/C:            Co-evaluation              AM-PAC OT "6 Clicks" Daily Activity     Outcome Measure Help from another person eating meals?: None Help from another person taking care of personal grooming?: A Little Help from another person toileting, which includes using toliet, bedpan, or urinal?: A Little Help from another person bathing (including washing, rinsing, drying)?: A Little Help from another person to put on and taking off regular upper body clothing?: None Help from another person to put on and taking off regular lower body clothing?: None 6 Click Score: 21   End of Session Nurse Communication: Mobility status  Activity Tolerance: Patient tolerated treatment well Patient left: in chair;with call bell/phone within reach;with chair alarm set  OT Visit Diagnosis: Other abnormalities of gait and mobility (R26.89);Other symptoms and signs involving cognitive function                Time: 8721-5872 OT Time Calculation (min): 25 min Charges:  OT General Charges $OT Visit: 1 Visit OT Evaluation $OT Eval Low Complexity: 1 Low OT Treatments $Self Care/Home Management : 8-22 mins  Rosey Bath OTR/L Acute Rehabilitation Services Office: [phone]   Rebeca Alert 04/26/2020, 10:26 AM

## 2020-04-26 NOTE — Progress Notes (Signed)
PT Cancellation Note  Patient Details Name: Jonathan Berry MRN: [id number] DOB: 1945/03/04   Cancelled Treatment:    Reason Eval/Treat Not Completed: PT screened, no needs identified, will sign off.    Bevelyn Buckles 04/26/2020, 11:57 AM Shawnae Leiva M,PT Acute Rehab Services [phone] (pager)

## 2020-04-26 NOTE — Evaluation (Signed)
Clinical/Bedside Swallow Evaluation Patient Details  Name: Jonathan Berry MRN: [id number] Date of Birth: 04-16-1945  Today's Date: 04/26/2020 Time: SLP Start Time (ACUTE ONLY): 1543 SLP Stop Time (ACUTE ONLY): 1555 SLP Time Calculation (min) (ACUTE ONLY): 12 min  Past Medical History:  Past Medical History:  Diagnosis Date  . Alzheimer disease (HCC)   . COVID   . Hypercholesteremia   . Hypertension    Past Surgical History:  Past Surgical History:  Procedure Laterality Date  . KNEE SURGERY    . MANDIBLE SURGERY     HPI:  75 y.o. male who has taken 3 shots of Covid mRNA vaccine with history of hypertension, hyperlipidemia, dementia was brought to the ER after patient was becoming increasingly confused weak and increasing cough, fever, mild SOB. Dx acute resp failure due to acute COVID pna. His wife reports no prior issues with swallow related to dementia.   Assessment / Plan / Recommendation Clinical Impression  Pt presents with normal oropharyngeal swallow with adequate attention and mastication, brisk swallow response, good respiratory/swallowing coordination, no s/s of aspiration.  Discussed prior level of function with pt's wife and dtr at bedside.  No overt concerns for dysphagia associated with new COVID pna. Continue current diet; SLP will sign off. SLP Visit Diagnosis: Dysphagia, unspecified (R13.10)    Aspiration Risk  No limitations    Diet Recommendation   currently on soft mechanical, thin liquids  Medication Administration: Whole meds with liquid    Other  Recommendations Oral Care Recommendations: Oral care BID   Follow up Recommendations None      Frequency and Duration            Prognosis        Swallow Study   General HPI: 75 y.o. male who has taken 3 shots of Covid mRNA vaccine with history of hypertension, hyperlipidemia, dementia was brought to the ER after patient was becoming increasingly confused weak and increasing cough, fever, mild SOB. Dx  acute resp failure due to acute COVID pna. His wife reports no prior issues with swallow related to dementia. Type of Study: Bedside Swallow Evaluation Previous Swallow Assessment: no Diet Prior to this Study: Dysphagia 3 (soft);Thin liquids Temperature Spikes Noted: No Respiratory Status: Room air History of Recent Intubation: No Behavior/Cognition: Alert;Cooperative Oral Cavity Assessment: Within Functional Limits Oral Care Completed by SLP: No Oral Cavity - Dentition: Adequate natural dentition Vision: Functional for self-feeding Self-Feeding Abilities: Able to feed self Patient Positioning: Upright in chair Baseline Vocal Quality: Normal Volitional Cough: Strong Volitional Swallow: Able to elicit    Oral/Motor/Sensory Function Overall Oral Motor/Sensory Function: Within functional limits   Ice Chips Ice chips: Not tested   Thin Liquid Thin Liquid: Within functional limits    Nectar Thick Nectar Thick Liquid: Not tested   Honey Thick Honey Thick Liquid: Not tested   Puree Puree: Within functional limits   Solid     Solid: Within functional limits      Jonathan Berry Jonathan Berry 04/26/2020,4:07 PM   Jonathan Folks L. Samson Frederic, MA CCC/SLP Acute Rehabilitation Services Office number [phone] Pager [phone]

## 2020-04-26 NOTE — Progress Notes (Signed)
PROGRESS NOTE                                                                                                                                                                                                             Patient Demographics:    Jonathan Berry, is a 75 y.o. male, DOB - September 15, 1945, FAO:[id number]  Outpatient Primary MD for the patient is Pcp, No    LOS - 1  Admit date - 04/25/2020    Chief Complaint  Patient presents with  . Covid+/Fever    Dry Cough       Brief Narrative (HPI from H&P) - Jonathan Berry is a 75 y.o. male who has taken 3 shots of Covid mRNA vaccine with history of hypertension, hyperlipidemia, dementia was brought to the ER after patient was becoming increasingly confused weak and increasing cough, fever, mild SOB, 1 episode of N&V, he was brought to ER by EMS found to be febrile, he had left he was also positive for COVID-19 infection but had very elevated procalcitonin levels.  He was admitted for pneumonia treatment.   Subjective:    Jonathan Berry today has, No headache, No chest pain, No abdominal pain - No Nausea, No new weakness tingling or numbness, mild cough, no SOB   Assessment  & Plan :     1. Acute Hypoxic Resp. Failure due to Acute Covid 19 Viral Pneumonitis + Bacterial PNA - he is fully immunized and boosted for COVID-19 infection, he does have gradually worsening dementia and is very high risk for aspiration.  Procalcitonin was elevated.  I think he has a mixed picture of bacterial and viral pneumonia.  Will be placed on antibiotics, IV fluids, steroids, Remdesivir, he has been started on Paxlovid in the outpatient setting and we will finish the course.  Feeding assistance and aspiration cautions, soft diet, keep the bed elevated and T head at 35 degrees, speech follow-up.  Encouraged the patient to sit up in chair in the daytime use I-S and flutter valve for pulmonary toiletry.  Will  advance activity and titrate down oxygen as possible.  Poor candidate for Actemra due to elevated procalcitonin and possible bacterial infection.  SpO2: 95 %  Recent Labs  Lab 04/25/20 2045 04/25/20 2227 04/26/20 0150 04/26/20 0208 04/26/20 0251 04/26/20 0434 04/26/20 0628 04/26/20 0743  WBC 9.4  --   --   --  7.7  --   --   --   HGB 14.2  --   --   --  13.6  --   --   --   HCT 42.7  --   --   --  40.8  --   --   --   PLT 179  --   --   --  179  --   --   --   CRP  --  14.3*  --   --  16.0*  --   --   --   BNP  --   --   --   --   --   --  92.8  --   DDIMER  --  1.83*  --   --  2.27*  --   --   --   PROCALCITON  --  18.99  --   --   --   --   --  21.14  AST 29  --   --   --  32  --   --   --   ALT 31  --   --   --  31  --   --   --   ALKPHOS 59  --   --   --  49  --   --   --   BILITOT 1.4*  --   --   --  1.4*  --   --   --   ALBUMIN 3.7  --   --   --  3.4*  --   --   --   LATICACIDVEN  --   --   --  1.7  --  1.5  --   --   SARSCOV2NAA  --   --  POSITIVE*  --   --   --   --   --     2.  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  Continue Aricept, minimize narcotics and benzodiazepines, as needed Haldol.  3.  Dyslipidemia.  Continue statin.  4.  AKI.  Hydrate and monitor.         Condition -  Guarded  Family Communication  :  Wife [phone] on 04/26/20  Code Status :  DNR  Consults  :  None  PUD Prophylaxis : PPI   Procedures  :            Disposition Plan  :    Status is: Inpatient  Remains inpatient appropriate because:IV treatments appropriate due to intensity of illness or inability to take PO   Dispo: The patient is from: Home              Anticipated d/c is to: Home              Patient currently is not medically stable to d/c.   Difficult to place patient No   DVT Prophylaxis  :  Lovenox    Lab Results  Component Value Date   PLT 179 04/26/2020    Diet :  Diet Order            DIET SOFT Room service appropriate?  Yes; Fluid consistency: Thin  Diet effective now                  Inpatient Medications  Scheduled Meds: . Alpha-Lipoic Acid  300 mg Oral Daily  . aspirin  81 mg Oral Daily  . atorvastatin  20 mg Oral Daily  . dexamethasone  6 mg Oral Q24H  . donepezil  10 mg Oral QHS  . enoxaparin (LOVENOX) injection  40 mg Subcutaneous Q24H  . Nirmatrelvir & Ritonavir  3 tablet Oral See admin instructions   Continuous Infusions: . azithromycin 250 mL/hr at 04/26/20 0600  . cefTRIAXone (ROCEPHIN)  IV 2 g (04/26/20 0500)  . lactated ringers    . remdesivir 200 mg in sodium chloride 0.9% 250 mL IVPB     Followed by  . [START ON 04/27/2020] remdesivir 100 mg in NS 100 mL     PRN Meds:.acetaminophen **OR** acetaminophen, haloperidol lactate  Antibiotics  :    Anti-infectives (From admission, onward)   Start     Dose/Rate Route Frequency Ordered Stop   04/27/20 1000  remdesivir 100 mg in sodium chloride 0.9 % 100 mL IVPB       "Followed by" Linked Group Details   100 mg 200 mL/hr over 30 Minutes Intravenous Daily 04/26/20 0523 05/01/20 0959   04/26/20 1053  Nirmatrelvir & Ritonavir 20 x 150 MG & 10 x 100MG  TBPK 3 tablet       Note to Pharmacy: Finish the outpt course   3 tablet Oral See admin instructions 04/26/20 1053     04/26/20 0700  remdesivir 200 mg in sodium chloride 0.9% 250 mL IVPB       "Followed by" Linked Group Details   200 mg 580 mL/hr over 30 Minutes Intravenous Once 04/26/20 0523     04/26/20 0345  cefTRIAXone (ROCEPHIN) 2 g in sodium chloride 0.9 % 100 mL IVPB        2 g 200 mL/hr over 30 Minutes Intravenous Daily 04/26/20 0316 05/01/20 0559   04/26/20 0345  azithromycin (ZITHROMAX) 500 mg in sodium chloride 0.9 % 250 mL IVPB        500 mg 250 mL/hr over 60 Minutes Intravenous Daily 04/26/20 0316 05/01/20 0559       Time Spent in minutes  30   05/03/20 M.D on 04/26/2020 at 10:55 AM  To page go to [email or web address]   [facility] -  Office  [phone]     See all Orders from today for further details    Objective:   Vitals:   04/26/20 0300 04/26/20 0400 04/26/20 0600 04/26/20 0948  BP: 107/88 121/73  116/67  Pulse: 66 63  70  Resp: (!) 22   19  Temp:  99.1 F (37.3 C)  98.6 F (37 C)  TempSrc:  Oral  Oral  SpO2: 91% 91% 93% 95%  Weight:      Height:        Wt Readings from Last 3 Encounters:  04/25/20 75 kg     Intake/Output Summary (Last 24 hours) at 04/26/2020 1055 Last data filed at 04/26/2020 0600 Gross per 24 hour  Intake 1465.73 ml  Output --  Net 1465.73 ml     Physical Exam  Awake but confused, No new F.N deficits,   [place].AT,PERRAL Supple Neck,No JVD, No cervical lymphadenopathy appriciated.  Symmetrical Chest wall movement, Good air movement bilaterally, CTAB RRR,No Gallops,Rubs or new Murmurs, No Parasternal Heave +ve B.Sounds, Abd Soft, No tenderness, No organomegaly appriciated, No rebound - guarding or rigidity. No Cyanosis, Clubbing or edema, No new Rash or bruise     Data Review:    CBC Recent Labs  Lab 04/25/20 2045 04/26/20 0251  WBC 9.4 7.7  HGB 14.2 13.6  HCT 42.7 40.8  PLT 179 179  MCV 89.9 90.3  MCH 29.9  30.1  MCHC 33.3 33.3  RDW 13.2 13.3  LYMPHSABS 0.7 0.9  MONOABS 1.1* 0.7  EOSABS 0.1 0.0  BASOSABS 0.0 0.0    Recent Labs  Lab 04/25/20 2045 04/25/20 2227 04/26/20 0208 04/26/20 0251 04/26/20 0434 04/26/20 0628 04/26/20 0743  NA 135  --   --  139  --   --   --   K 3.6  --   --  3.8  --   --   --   CL 102  --   --  103  --   --   --   CO2 25  --   --  27  --   --   --   GLUCOSE 113*  --   --  113*  --   --   --   BUN 16  --   --  17  --   --   --   CREATININE 1.57*  --   --  1.29*  --   --   --   CALCIUM 8.6*  --   --  8.6*  --   --   --   AST 29  --   --  32  --   --   --   ALT 31  --   --  31  --   --   --   ALKPHOS 59  --   --  49  --   --   --   BILITOT 1.4*  --   --  1.4*  --   --   --   ALBUMIN 3.7  --   --  3.4*  --   --   --   MG  --   --   --   --   --    --  1.7  CRP  --  14.3*  --  16.0*  --   --   --   DDIMER  --  1.83*  --  2.27*  --   --   --   PROCALCITON  --  18.99  --   --   --   --  21.14  LATICACIDVEN  --   --  1.7  --  1.5  --   --   BNP  --   --   --   --   --  92.8  --     ------------------------------------------------------------------------------------------------------------------ Recent Labs    04/25/20 2227  TRIG 37    No results found for: HGBA1C ------------------------------------------------------------------------------------------------------------------ No results for input(s): TSH, T4TOTAL, T3FREE, THYROIDAB in the last 72 hours.  Invalid input(s): FREET3  Cardiac Enzymes No results for input(s): CKMB, TROPONINI, MYOGLOBIN in the last 168 hours.  Invalid input(s): CK ------------------------------------------------------------------------------------------------------------------    Component Value Date/Time   BNP 92.8 04/26/2020 4008    Micro Results Recent Results (from the past 240 hour(s))  Resp Panel by RT-PCR (Flu A&B, Covid) Nasopharyngeal Swab     Status: Abnormal   Collection Time: 04/26/20  1:50 AM   Specimen: Nasopharyngeal Swab; Nasopharyngeal(NP) swabs in vial transport medium  Result Value Ref Range Status   SARS Coronavirus 2 by RT PCR POSITIVE (A) NEGATIVE Final    Comment: RESULT CALLED TO, READ BACK BY AND VERIFIED WITH: D C RN 04/26/20 0507 JDW (NOTE) SARS-CoV-2 target nucleic acids are DETECTED.  The SARS-CoV-2 RNA is generally detectable in upper respiratory specimens during the acute phase of infection. Positive results are indicative of the presence of  the identified virus, but do not rule out bacterial infection or co-infection with other pathogens not detected by the test. Clinical correlation with patient history and other diagnostic information is necessary to determine patient infection status. The expected result is Negative.  Fact Sheet for  Patients: BloggerCourse.comhttps://www.fda.gov/media/152166/download  Fact Sheet for Healthcare Providers: SeriousBroker.ithttps://www.fda.gov/media/152162/download  This test is not yet approved or cleared by the [place] FDA and  has been authorized for detection and/or diagnosis of SARS-CoV-2 by FDA under an Emergency Use Authorization (EUA).  This EUA will remain in effect (meaning this test can be used) for  the duration of  the COVID-19 declaration under Section 564(b)(1) of the Act, 21 U.S.C. section 360bbb-3(b)(1), unless the authorization is terminated or revoked sooner.     Influenza A by PCR NEGATIVE NEGATIVE Final   Influenza B by PCR NEGATIVE NEGATIVE Final    Comment: (NOTE) The Xpert Xpress SARS-CoV-2/FLU/RSV plus assay is intended as an aid in the diagnosis of influenza from Nasopharyngeal swab specimens and should not be used as a sole basis for treatment. Nasal washings and aspirates are unacceptable for Xpert Xpress SARS-CoV-2/FLU/RSV testing.  Fact Sheet for Patients: BloggerCourse.comhttps://www.fda.gov/media/152166/download  Fact Sheet for Healthcare Providers: SeriousBroker.ithttps://www.fda.gov/media/152162/download  This test is not yet approved or cleared by the [place] FDA and has been authorized for detection and/or diagnosis of SARS-CoV-2 by FDA under an Emergency Use Authorization (EUA). This EUA will remain in effect (meaning this test can be used) for the duration of the COVID-19 declaration under Section 564(b)(1) of the Act, 21 U.S.C. section 360bbb-3(b)(1), unless the authorization is terminated or revoked.  Performed at [facility], [address] [place], [place] [address]     Radiology Reports DG Chest [place] 1 View  Result Date: 04/26/2020 CLINICAL DATA:  Dyspnea, COVID pneumonia EXAM: PORTABLE CHEST 1 VIEW COMPARISON:  None. FINDINGS: Pulmonary insufflation is normal and symmetric. Left basilar focal pulmonary infiltrate has improved slightly in the interval. No pneumothorax or  pleural effusion. Cardiac size within normal limits. Pulmonary vascularity is normal. IMPRESSION: Improving left basilar infiltrate Electronically Signed   By: Helyn NumbersAshesh  Parikh MD   On: 04/26/2020 06:49   DG Chest Portable 1 View  Result Date: 04/25/2020 CLINICAL DATA:  Cough and fever.  COVID positive yesterday. EXAM: PORTABLE CHEST 1 VIEW COMPARISON:  None. FINDINGS: Low lung volumes. Patchy heterogeneous bilateral airspace opacities in a mid-lower lung zone predominant distribution. Heart is normal in size with normal mediastinal contours. Aortic atherosclerosis. No pleural effusion, pneumothorax, or evidence of pneumomediastinum. No acute osseous abnormalities are seen. IMPRESSION: Patchy heterogeneous bilateral airspace opacities in a mid-lower lung zone predominant distribution, pattern typical of COVID-19. Electronically Signed   By: Narda RutherfordMelanie  Sanford M.D.   On: 04/25/2020 20:24

## 2020-04-26 NOTE — Progress Notes (Signed)
Critical result  Covid Positive @0150    Reported by , microbiology @0505   Handed off to [place], 

## 2020-04-27 ENCOUNTER — Inpatient Hospital Stay (HOSPITAL_COMMUNITY): Payer: Medicare Other

## 2020-04-27 LAB — COMPREHENSIVE METABOLIC PANEL
ALT: 36 U/L (ref 0–44)
AST: 75 U/L — ABNORMAL HIGH (ref 15–41)
Albumin: 2.9 g/dL — ABNORMAL LOW (ref 3.5–5.0)
Alkaline Phosphatase: 47 U/L (ref 38–126)
Anion gap: 8 (ref 5–15)
BUN: 17 mg/dL (ref 8–23)
CO2: 23 mmol/L (ref 22–32)
Calcium: 8.5 mg/dL — ABNORMAL LOW (ref 8.9–10.3)
Chloride: 108 mmol/L (ref 98–111)
Creatinine, Ser: 1.01 mg/dL (ref 0.61–1.24)
GFR, Estimated: >60 mL/min (ref >60)
Glucose, Bld: 195 mg/dL — ABNORMAL HIGH (ref 70–99)
Potassium: 4 mmol/L (ref 3.5–5.1)
Sodium: 139 mmol/L (ref 135–145)
Total Bilirubin: 0.5 mg/dL (ref 0.3–1.2)
Total Protein: 5.6 g/dL — ABNORMAL LOW (ref 6.5–8.1)

## 2020-04-27 LAB — CBC WITH DIFFERENTIAL/PLATELET
Abs Immature Granulocytes: 0 10*3/uL (ref 0.00–0.07)
Basophils Absolute: 0 10*3/uL (ref 0.0–0.1)
Basophils Relative: 0 %
Eosinophils Absolute: 0 10*3/uL (ref 0.0–0.5)
Eosinophils Relative: 0 %
HCT: 36.4 % — ABNORMAL LOW (ref 39.0–52.0)
Hemoglobin: 12.5 g/dL — ABNORMAL LOW (ref 13.0–17.0)
Lymphocytes Relative: 11 %
Lymphs Abs: 1.2 10*3/uL (ref 0.7–4.0)
MCH: 30.3 pg (ref 26.0–34.0)
MCHC: 34.3 g/dL (ref 30.0–36.0)
MCV: 88.1 fL (ref 80.0–100.0)
Monocytes Absolute: 0.2 10*3/uL (ref 0.1–1.0)
Monocytes Relative: 2 %
Neutro Abs: 9.7 10*3/uL — ABNORMAL HIGH (ref 1.7–7.7)
Neutrophils Relative %: 87 %
Platelets: 166 10*3/uL (ref 150–400)
RBC: 4.13 MIL/uL — ABNORMAL LOW (ref 4.22–5.81)
RDW: 13.2 % (ref 11.5–15.5)
WBC: 11.2 10*3/uL — ABNORMAL HIGH (ref 4.0–10.5)
nRBC: 0 % (ref 0.0–0.2)
nRBC: 0 /100 WBC

## 2020-04-27 LAB — C-REACTIVE PROTEIN: CRP: 17.3 mg/dL — ABNORMAL HIGH (ref <1.0)

## 2020-04-27 LAB — D-DIMER, QUANTITATIVE: D-Dimer, Quant: 0.63 ug/mL-FEU — ABNORMAL HIGH (ref 0.00–0.50)

## 2020-04-27 LAB — MAGNESIUM: Magnesium: 2.2 mg/dL (ref 1.7–2.4)

## 2020-04-27 LAB — PROCALCITONIN: Procalcitonin: 18.07 ng/mL

## 2020-04-27 MED ORDER — ACETYL-L-CARNITINE HCL POWD
1.0000 | Freq: Every day | Status: DC
  Administered 2020-04-28: 1

## 2020-04-27 MED ORDER — LORAZEPAM 2 MG/ML IJ SOLN
0.5000 mg | Freq: Once | INTRAMUSCULAR | Status: AC | PRN
  Administered 2020-04-27: 0.5 mg via INTRAVENOUS
  Filled 2020-04-27: qty 1

## 2020-04-27 MED ORDER — VITAMIN D 25 MCG (1000 UNIT) PO TABS
5000.0000 [IU] | ORAL_TABLET | Freq: Every day | ORAL | Status: DC
  Administered 2020-04-28 – 2020-04-29 (×2): 5000 [IU] via ORAL
  Filled 2020-04-27 (×3): qty 5

## 2020-04-27 NOTE — Progress Notes (Signed)
PROGRESS NOTE                                                                                                                                                                                                             Patient Demographics:    Jonathan Berry, is a 75 y.o. male, DOB - 11/16/1945, DJT:[id number]  Outpatient Primary MD for the patient is Pcp, No    LOS - 2  Admit date - 04/25/2020    Chief Complaint  Patient presents with  . Covid Fever    Dry Cough       Brief Narrative (HPI from H&P) - Jonathan Berry is a 75 y.o. male who has taken 3 shots of Covid mRNA vaccine with history of hypertension, hyperlipidemia, dementia was brought to the ER after patient was becoming increasingly confused weak and increasing cough, fever, mild SOB, 1 episode of N&V, he was brought to ER by EMS found to be febrile, he had left he was also positive for COVID-19 infection but had very elevated procalcitonin levels.  He was admitted for pneumonia treatment.   Subjective:   Patient in bed, appears comfortable, denies any headache, no fever, no chest pain or pressure, no shortness of breath , no abdominal pain. No new focal weakness.    Assessment  & Plan :     1. Acute Hypoxic Resp. Failure due to Acute Covid 19 Viral Pneumonitis + Bacterial PNA - he is fully immunized and boosted for COVID-19 infection, he does have gradually worsening dementia and is very high risk for aspiration.  Procalcitonin was elevated.  I think he has a mixed picture of bacterial and viral pneumonia.  Will be placed on antibiotics, IV fluids, steroids, Remdesivir, he has been started on Paxlovid in the outpatient setting and we will finish the course.  Feeding assistance and aspiration cautions, soft diet, keep the bed elevated at 35 degrees, speech following.  Encouraged the patient to sit up in chair in the daytime use I-S and flutter valve for pulmonary  toiletry.  Will advance activity and titrate down oxygen as possible.  Poor candidate for Actemra due to elevated procalcitonin and possible bacterial infection.  SpO2: 95 %  Recent Labs  Lab 04/25/20 2045 04/25/20 2227 04/26/20 0150 04/26/20 0208 04/26/20 0251 04/26/20 0434 04/26/20 0628 04/26/20 0743 04/27/20 0139  WBC  9.4  --   --   --  7.7  --   --   --  11.2*  HGB 14.2  --   --   --  13.6  --   --   --  12.5*  HCT 42.7  --   --   --  40.8  --   --   --  36.4*  PLT 179  --   --   --  179  --   --   --  166  CRP  --  14.3*  --   --  16.0*  --   --   --  17.3*  BNP  --   --   --   --   --   --  92.8  --   --   DDIMER  --  1.83*  --   --  2.27*  --   --   --  0.63*  PROCALCITON  --  18.99  --   --   --   --   --  21.14 18.07  AST 29  --   --   --  32  --   --   --  75*  ALT 31  --   --   --  31  --   --   --  36  ALKPHOS 59  --   --   --  49  --   --   --  47  BILITOT 1.4*  --   --   --  1.4*  --   --   --  0.5  ALBUMIN 3.7  --   --   --  3.4*  --   --   --  2.9*  LATICACIDVEN  --   --   --  1.7  --  1.5  --   --   --   SARSCOV2NAA  --   --  POSITIVE*  --   --   --   --   --   --     2.  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  Continue Aricept, minimize narcotics and benzodiazepines, as needed Haldol.  3.  Dyslipidemia.  Continue statin.  4.  AKI.  Resolved after IVF.        Condition -  Guarded  Family Communication  :  Wife [phone] on 04/26/20, 04/27/20  Code Status :  DNR  Consults  :  None  PUD Prophylaxis : PPI   Procedures  :            Disposition Plan  :    Status is: Inpatient  Remains inpatient appropriate because:IV treatments appropriate due to intensity of illness or inability to take PO   Dispo: The patient is from: Home              Anticipated d/c is to: Home              Patient currently is not medically stable to d/c.   Difficult to place patient No   DVT Prophylaxis  :  Lovenox    Lab Results   Component Value Date   PLT 166 04/27/2020    Diet :  Diet Order            DIET SOFT Room service appropriate? Yes; Fluid consistency: Thin  Diet effective now                  Inpatient  Medications  Scheduled Meds: . aspirin  81 mg Oral Daily  . atorvastatin  20 mg Oral Daily  . dexamethasone  6 mg Oral Q24H  . donepezil  10 mg Oral QHS  . enoxaparin (LOVENOX) injection  40 mg Subcutaneous Q24H  . Nirmatrelvir & Ritonavir  3 tablet Oral See admin instructions  . pantoprazole  40 mg Oral Daily   Continuous Infusions: . ampicillin-sulbactam (UNASYN) IV 3 g (04/27/20 0039)  . azithromycin 250 mL/hr at 04/26/20 0600  . remdesivir 100 mg in NS 100 mL     PRN Meds:.acetaminophen **OR** acetaminophen, haloperidol lactate  Antibiotics  :    Anti-infectives (From admission, onward)   Start     Dose/Rate Route Frequency Ordered Stop   04/27/20 1000  remdesivir 100 mg in sodium chloride 0.9 % 100 mL IVPB       "Followed by" Linked Group Details   100 mg 200 mL/hr over 30 Minutes Intravenous Daily 04/26/20 0523 05/01/20 0959   04/26/20 1215  Ampicillin-Sulbactam (UNASYN) 3 g in sodium chloride 0.9 % 100 mL IVPB        3 g 200 mL/hr over 30 Minutes Intravenous Every 6 hours 04/26/20 1122     04/26/20 1053  Nirmatrelvir & Ritonavir 20 x 150 MG & 10 x  TBPK 3 tablet       Note to Pharmacy: Finish the outpt course   3 tablet Oral See admin instructions 04/26/20 1053     04/26/20 0700  remdesivir 200 mg in sodium chloride 0.9% 250 mL IVPB       "Followed by" Linked Group Details   200 mg 580 mL/hr over 30 Minutes Intravenous Once 04/26/20 0523 04/26/20 1625   04/26/20 0345  cefTRIAXone (ROCEPHIN) 2 g in sodium chloride 0.9 % 100 mL IVPB  Status:  Discontinued        2 g 200 mL/hr over 30 Minutes Intravenous Daily 04/26/20 0316 04/26/20 1058   04/26/20 0345  azithromycin (ZITHROMAX) 500 mg in sodium chloride 0.9 % 250 mL IVPB        500 mg 250 mL/hr over 60 Minutes  Intravenous Daily 04/26/20 0316 05/01/20 0559       Time Spent in minutes  30   Susa Raring M.D on 04/27/2020 at 12:07 PM  To page go to [email or web address]   [facility] -  Office  [phone]    See all Orders from today for further details    Objective:   Vitals:   04/26/20 0300 04/26/20 0400 04/26/20 0600 04/26/20 0948  BP: 107/88 121/73  116/67  Pulse: 66 63  70  Resp: (!) 22   19  Temp:  99.1 F (37.3 C)  98.6 F (37 C)  TempSrc:  Oral  Oral  SpO2: 91% 91% 93% 95%  Weight:      Height:        Wt Readings from Last 3 Encounters:  04/25/20 75 kg     Intake/Output Summary (Last 24 hours) at 04/27/2020 1207 Last data filed at 04/27/2020 0300 Gross per 24 hour  Intake 400 ml  Output 1000 ml  Net -600 ml     Physical Exam  Awake but confused, No new F.N deficits,   .AT,PERRAL Supple Neck,No JVD, No cervical lymphadenopathy appriciated.  Symmetrical Chest wall movement, Good air movement bilaterally, CTAB RRR,No Gallops, Rubs or new Murmurs, No Parasternal Heave +ve B.Sounds, Abd Soft, No tenderness, No organomegaly appriciated, No rebound - guarding or rigidity. No Cyanosis, Clubbing  or edema, No new Rash or bruise     Data Review:    CBC Recent Labs  Lab 04/25/20 2045 04/26/20 0251 04/27/20 0139  WBC 9.4 7.7 11.2*  HGB 14.2 13.6 12.5*  HCT 42.7 40.8 36.4*  PLT 179 179 166  MCV 89.9 90.3 88.1  MCH 29.9 30.1 30.3  MCHC 33.3 33.3 34.3  RDW 13.2 13.3 13.2  LYMPHSABS 0.7 0.9 1.2  MONOABS 1.1* 0.7 0.2  EOSABS 0.1 0.0 0.0  BASOSABS 0.0 0.0 0.0    Recent Labs  Lab 04/25/20 2045 04/25/20 2227 04/26/20 0208 04/26/20 0251 04/26/20 0434 04/26/20 0628 04/26/20 0743 04/27/20 0139  NA 135  --   --  139  --   --   --  139  K 3.6  --   --  3.8  --   --   --  4.0  CL 102  --   --  103  --   --   --  108  CO2 25  --   --  27  --   --   --  23  GLUCOSE 113*  --   --  113*  --   --   --  195*  BUN 16  --   --  17  --   --   --  17   CREATININE 1.57*  --   --  1.29*  --   --   --  1.01  CALCIUM 8.6*  --   --  8.6*  --   --   --  8.5*  AST 29  --   --  32  --   --   --  75*  ALT 31  --   --  31  --   --   --  36  ALKPHOS 59  --   --  49  --   --   --  47  BILITOT 1.4*  --   --  1.4*  --   --   --  0.5  ALBUMIN 3.7  --   --  3.4*  --   --   --  2.9*  MG  --   --   --   --   --   --  1.7 2.2  CRP  --  14.3*  --  16.0*  --   --   --  17.3*  DDIMER  --  1.83*  --  2.27*  --   --   --  0.63*  PROCALCITON  --  18.99  --   --   --   --  21.14 18.07  LATICACIDVEN  --   --  1.7  --  1.5  --   --   --   BNP  --   --   --   --   --  92.8  --   --     ------------------------------------------------------------------------------------------------------------------ Recent Labs    04/25/20 2227  TRIG 37    No results found for: HGBA1C ------------------------------------------------------------------------------------------------------------------ No results for input(s): TSH, T4TOTAL, T3FREE, THYROIDAB in the last 72 hours.  Invalid input(s): FREET3  Cardiac Enzymes No results for input(s): CKMB, TROPONINI, MYOGLOBIN in the last 168 hours.  Invalid input(s): CK ------------------------------------------------------------------------------------------------------------------    Component Value Date/Time   BNP 92.8 04/26/2020 [id number]    Micro Results Recent Results (from the past 240 hour(s))  Blood Culture (routine x 2)     Status: None (Preliminary result)   Collection  Time: 04/25/20  2:05 AM   Specimen: BLOOD  Result Value Ref Range Status   Specimen Description BLOOD SITE NOT SPECIFIED  Final   Special Requests   Final    BOTTLES DRAWN AEROBIC AND ANAEROBIC Blood Culture adequate volume   Culture   Final    NO GROWTH 1 DAY Performed at [facility], [address] [place], [place] [address]    Report Status PENDING  Incomplete  Resp Panel by RT-PCR (Flu A&B, Covid) Nasopharyngeal Swab     Status:  Abnormal   Collection Time: 04/26/20  1:50 AM   Specimen: Nasopharyngeal Swab; Nasopharyngeal(NP) swabs in vial transport medium  Result Value Ref Range Status   SARS Coronavirus 2 by RT PCR POSITIVE (A) NEGATIVE Final    Comment: RESULT CALLED TO, READ BACK BY AND VERIFIED WITH: D C RN 04/26/20 0507 JDW (NOTE) SARS-CoV-2 target nucleic acids are DETECTED.  The SARS-CoV-2 RNA is generally detectable in upper respiratory specimens during the acute phase of infection. Positive results are indicative of the presence of the identified virus, but do not rule out bacterial infection or co-infection with other pathogens not detected by the test. Clinical correlation with patient history and other diagnostic information is necessary to determine patient infection status. The expected result is Negative.  Fact Sheet for Patients: BloggerCourse.com  Fact Sheet for Healthcare Providers: SeriousBroker.it  This test is not yet approved or cleared by the [place] FDA and  has been authorized for detection and/or diagnosis of SARS-CoV-2 by FDA under an Emergency Use Authorization (EUA).  This EUA will remain in effect (meaning this test can be used) for  the duration of  the COVID-19 declaration under Section 564(b)(1) of the Act, 21 U.S.C. section 360bbb-3(b)(1), unless the authorization is terminated or revoked sooner.     Influenza A by PCR NEGATIVE NEGATIVE Final   Influenza B by PCR NEGATIVE NEGATIVE Final    Comment: (NOTE) The Xpert Xpress SARS-CoV-2/FLU/RSV plus assay is intended as an aid in the diagnosis of influenza from Nasopharyngeal swab specimens and should not be used as a sole basis for treatment. Nasal washings and aspirates are unacceptable for Xpert Xpress SARS-CoV-2/FLU/RSV testing.  Fact Sheet for Patients: BloggerCourse.com  Fact Sheet for Healthcare  Providers: SeriousBroker.it  This test is not yet approved or cleared by the [place] FDA and has been authorized for detection and/or diagnosis of SARS-CoV-2 by FDA under an Emergency Use Authorization (EUA). This EUA will remain in effect (meaning this test can be used) for the duration of the COVID-19 declaration under Section 564(b)(1) of the Act, 21 U.S.C. section 360bbb-3(b)(1), unless the authorization is terminated or revoked.  Performed at [facility], [address] [place], [place] [address]   Blood Culture (routine x 2)     Status: None (Preliminary result)   Collection Time: 04/26/20  1:50 AM   Specimen: BLOOD  Result Value Ref Range Status   Specimen Description BLOOD SITE NOT SPECIFIED  Final   Special Requests   Final    BOTTLES DRAWN AEROBIC AND ANAEROBIC Blood Culture adequate volume   Culture   Final    NO GROWTH 1 DAY Performed at [facility], [address], [place], [place] [address]    Report Status PENDING  Incomplete    Radiology Reports DG Chest Port 1 View  Result Date: 04/26/2020 CLINICAL DATA:  Dyspnea, COVID pneumonia EXAM: PORTABLE CHEST 1 VIEW COMPARISON:  None. FINDINGS: Pulmonary insufflation  is normal and symmetric. Left basilar focal pulmonary infiltrate has improved slightly in the interval. No pneumothorax or pleural effusion. Cardiac size within normal limits. Pulmonary vascularity is normal. IMPRESSION: Improving left basilar infiltrate Electronically Signed   By: Helyn Numbers MD   On: 04/26/2020 06:49   DG Chest Portable 1 View  Result Date: 04/25/2020 CLINICAL DATA:  Cough and fever.  COVID positive yesterday. EXAM: PORTABLE CHEST 1 VIEW COMPARISON:  None. FINDINGS: Low lung volumes. Patchy heterogeneous bilateral airspace opacities in a mid-lower lung zone predominant distribution. Heart is normal in size with normal mediastinal contours. Aortic atherosclerosis. No pleural effusion,  pneumothorax, or evidence of pneumomediastinum. No acute osseous abnormalities are seen. IMPRESSION: Patchy heterogeneous bilateral airspace opacities in a mid-lower lung zone predominant distribution, pattern typical of COVID-19. Electronically Signed   By: Narda Rutherford M.D.   On: 04/25/2020 20:24

## 2020-04-28 LAB — COMPREHENSIVE METABOLIC PANEL
ALT: 80 U/L — ABNORMAL HIGH (ref 0–44)
AST: 173 U/L — ABNORMAL HIGH (ref 15–41)
Albumin: 3 g/dL — ABNORMAL LOW (ref 3.5–5.0)
Alkaline Phosphatase: 44 U/L (ref 38–126)
Anion gap: 6 (ref 5–15)
BUN: 21 mg/dL (ref 8–23)
CO2: 24 mmol/L (ref 22–32)
Calcium: 8.5 mg/dL — ABNORMAL LOW (ref 8.9–10.3)
Chloride: 109 mmol/L (ref 98–111)
Creatinine, Ser: 0.96 mg/dL (ref 0.61–1.24)
GFR, Estimated: >60 mL/min (ref >60)
Glucose, Bld: 173 mg/dL — ABNORMAL HIGH (ref 70–99)
Potassium: 4.3 mmol/L (ref 3.5–5.1)
Sodium: 139 mmol/L (ref 135–145)
Total Bilirubin: 1 mg/dL (ref 0.3–1.2)
Total Protein: 5.5 g/dL — ABNORMAL LOW (ref 6.5–8.1)

## 2020-04-28 LAB — CBC WITH DIFFERENTIAL/PLATELET
Abs Immature Granulocytes: 0 10*3/uL (ref 0.00–0.07)
Basophils Absolute: 0 10*3/uL (ref 0.0–0.1)
Basophils Relative: 0 %
Eosinophils Absolute: 0.4 10*3/uL (ref 0.0–0.5)
Eosinophils Relative: 3 %
HCT: 36.3 % — ABNORMAL LOW (ref 39.0–52.0)
Hemoglobin: 12.4 g/dL — ABNORMAL LOW (ref 13.0–17.0)
Lymphocytes Relative: 3 %
Lymphs Abs: 0.4 10*3/uL — ABNORMAL LOW (ref 0.7–4.0)
MCH: 30.7 pg (ref 26.0–34.0)
MCHC: 34.2 g/dL (ref 30.0–36.0)
MCV: 89.9 fL (ref 80.0–100.0)
Monocytes Absolute: 0.1 10*3/uL (ref 0.1–1.0)
Monocytes Relative: 1 %
Neutro Abs: 11.8 10*3/uL — ABNORMAL HIGH (ref 1.7–7.7)
Neutrophils Relative %: 93 %
Platelets: 181 10*3/uL (ref 150–400)
RBC: 4.04 MIL/uL — ABNORMAL LOW (ref 4.22–5.81)
RDW: 13.4 % (ref 11.5–15.5)
WBC: 12.7 10*3/uL — ABNORMAL HIGH (ref 4.0–10.5)
nRBC: 0 % (ref 0.0–0.2)
nRBC: 0 /100 WBC

## 2020-04-28 LAB — MAGNESIUM: Magnesium: 2.2 mg/dL (ref 1.7–2.4)

## 2020-04-28 LAB — C-REACTIVE PROTEIN: CRP: 7.3 mg/dL — ABNORMAL HIGH (ref <1.0)

## 2020-04-28 LAB — D-DIMER, QUANTITATIVE: D-Dimer, Quant: 0.49 ug/mL-FEU (ref 0.00–0.50)

## 2020-04-28 LAB — PROCALCITONIN: Procalcitonin: 9.11 ng/mL

## 2020-04-28 MED ORDER — QUETIAPINE FUMARATE 50 MG PO TABS
50.0000 mg | ORAL_TABLET | Freq: Every day | ORAL | Status: DC
  Filled 2020-04-28: qty 1

## 2020-04-28 NOTE — Plan of Care (Signed)
  Problem: Education: Goal: Knowledge of risk factors and measures for prevention of condition will improve Outcome: Not Applicable

## 2020-04-28 NOTE — Progress Notes (Signed)
Pt was asleep. I asked pt to take medication for me & he stated he did not want to. Will continue to [employer]

## 2020-04-28 NOTE — Progress Notes (Signed)
PROGRESS NOTE                                                                                                                                                                                                             Patient Demographics:    Jonathan Berry, is a 75 y.o. male, DOB - August 12, 1945, TGG:[id number]  Outpatient Primary MD for the patient is Pcp, No    LOS - 3  Admit date - 04/25/2020    Chief Complaint  Patient presents with  . Covid Fever    Dry Cough       Brief Narrative (HPI from H&P) - Jonathan Berry is a 75 y.o. male who has taken 3 shots of Covid mRNA vaccine with history of hypertension, hyperlipidemia, dementia was brought to the ER after patient was becoming increasingly confused weak and increasing cough, fever, mild SOB, 1 episode of N&V, he was brought to ER by EMS found to be febrile, he had left he was also positive for COVID-19 infection but had very elevated procalcitonin levels.  He was admitted for pneumonia treatment.   Subjective:   Patient in bed, appears comfortable, denies any headache, no fever, no chest pain or pressure, no shortness of breath , no abdominal pain. No new focal weakness.   Assessment  & Plan :     1. Acute Hypoxic Resp. Failure due to Acute Covid 19 Viral Pneumonitis + Bacterial PNA - he is fully immunized and boosted for COVID-19 infection, he does have gradually worsening dementia and is very high risk for aspiration.  Procalcitonin was elevated.  I think he has a mixed picture of bacterial and viral pneumonia. Continue on antibiotics, steroids, Remdesivir, he has been started on Paxlovid in the outpatient setting and we will finish the course.  Feeding assistance and aspiration cautions, soft diet, keep the bed elevated at 35 degrees, speech following.  Encouraged the patient to sit up in chair in the daytime use I-S and flutter valve for pulmonary toiletry.  Will advance  activity and titrate down oxygen as possible.  Poor candidate for Actemra due to elevated procalcitonin and possible bacterial infection.  SpO2: 97 %  Recent Labs  Lab 04/25/20 2045 04/25/20 2227 04/26/20 0150 04/26/20 0208 04/26/20 0251 04/26/20 0434 04/26/20 0628 04/26/20 0743 04/27/20 0139 04/28/20 0134  WBC 9.4  --   --   --  7.7  --   --   --  11.2* 12.7*  HGB 14.2  --   --   --  13.6  --   --   --  12.5* 12.4*  HCT 42.7  --   --   --  40.8  --   --   --  36.4* 36.3*  PLT 179  --   --   --  179  --   --   --  166 181  CRP  --  14.3*  --   --  16.0*  --   --   --  17.3* 7.3*  BNP  --   --   --   --   --   --  92.8  --   --   --   DDIMER  --  1.83*  --   --  2.27*  --   --   --  0.63* 0.49  PROCALCITON  --  18.99  --   --   --   --   --  21.14 18.07 9.11  AST 29  --   --   --  32  --   --   --  75* 173*  ALT 31  --   --   --  31  --   --   --  36 80*  ALKPHOS 59  --   --   --  49  --   --   --  47 44  BILITOT 1.4*  --   --   --  1.4*  --   --   --  0.5 1.0  ALBUMIN 3.7  --   --   --  3.4*  --   --   --  2.9* 3.0*  LATICACIDVEN  --   --   --  1.7  --  1.5  --   --   --   --   SARSCOV2NAA  --   --  POSITIVE*  --   --   --   --   --   --   --     2.  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  Continue Aricept, minimize narcotics and benzodiazepines, as needed Haldol.  Nighttime Seroquel scheduled added.  3.  Dyslipidemia.  Continue statin.  4.  AKI.  Resolved after IVF.        Condition -  Guarded  Family Communication  :  Wife [phone] on 04/26/20, 04/27/20, 04/28/20  Code Status :  DNR  Consults  :  None  PUD Prophylaxis : PPI   Procedures  :            Disposition Plan  :    Status is: Inpatient  Remains inpatient appropriate because:IV treatments appropriate due to intensity of illness or inability to take PO   Dispo: The patient is from: Home              Anticipated d/c is to: Home              Patient currently is not  medically stable to d/c.   Difficult to place patient No   DVT Prophylaxis  :  Lovenox    Lab Results  Component Value Date   PLT 181 04/28/2020    Diet :  Diet Order            DIET SOFT Room service appropriate? Yes; Fluid consistency: Thin  Diet effective now  Inpatient Medications  Scheduled Meds: . Acetyl-L-Carnitine HCl  1 capsule Does not apply Daily  . aspirin  81 mg Oral Daily  . atorvastatin  20 mg Oral Daily  . cholecalciferol  5,000 Units Oral Daily  . dexamethasone  6 mg Oral Q24H  . donepezil  10 mg Oral QHS  . enoxaparin (LOVENOX) injection  40 mg Subcutaneous Q24H  . Nirmatrelvir & Ritonavir  3 tablet Oral See admin instructions  . pantoprazole  40 mg Oral Daily  . QUEtiapine  50 mg Oral QHS   Continuous Infusions: . ampicillin-sulbactam (UNASYN) IV 3 g (04/28/20 0445)  . azithromycin 500 mg (04/28/20 0509)  . remdesivir 100 mg in NS 100 mL 100 mg (04/27/20 1533)   PRN Meds:.acetaminophen **OR** acetaminophen, haloperidol lactate  Antibiotics  :    Anti-infectives (From admission, onward)   Start     Dose/Rate Route Frequency Ordered Stop   04/27/20 1000  remdesivir 100 mg in sodium chloride 0.9 % 100 mL IVPB       "Followed by" Linked Group Details   100 mg 200 mL/hr over 30 Minutes Intravenous Daily 04/26/20 0523 05/01/20 0959   04/26/20 1215  Ampicillin-Sulbactam (UNASYN) 3 g in sodium chloride 0.9 % 100 mL IVPB        3 g 200 mL/hr over 30 Minutes Intravenous Every 6 hours 04/26/20 1122     04/26/20 1053  Nirmatrelvir & Ritonavir 20 x 150 MG & 10 x  TBPK 3 tablet       Note to Pharmacy: Finish the outpt course   3 tablet Oral See admin instructions 04/26/20 1053     04/26/20 0700  remdesivir 200 mg in sodium chloride 0.9% 250 mL IVPB       "Followed by" Linked Group Details   200 mg 580 mL/hr over 30 Minutes Intravenous Once 04/26/20 0523 04/26/20 1625   04/26/20 0345  cefTRIAXone (ROCEPHIN) 2 g in sodium chloride  0.9 % 100 mL IVPB  Status:  Discontinued        2 g 200 mL/hr over 30 Minutes Intravenous Daily 04/26/20 0316 04/26/20 1058   04/26/20 0345  azithromycin (ZITHROMAX) 500 mg in sodium chloride 0.9 % 250 mL IVPB        500 mg 250 mL/hr over 60 Minutes Intravenous Daily 04/26/20 0316 05/01/20 0559       Time Spent in minutes  30   Susa Raring M.D on 04/28/2020 at 10:26 AM  To page go to [email or web address]   [facility] -  Office  [phone]    See all Orders from today for further details    Objective:   Vitals:   04/26/20 0600 04/26/20 0948 04/27/20 2139 04/28/20 0513  BP:  116/67 (!) 162/87 132/64  Pulse:  70 69 71  Resp:  Temp:  98.6 F (37 C) 97.6 F (36.4 C)   TempSrc:  Oral Oral   SpO2: 93% 95% 99% 97%  Weight:      Height:        Wt Readings from Last 3 Encounters:  04/25/20 75 kg     Intake/Output Summary (Last 24 hours) at 04/28/2020 1026 Last data filed at 04/28/2020 0300 Gross per 24 hour  Intake 817.78 ml  Output --  Net 817.78 ml     Physical Exam  Awake remians mildly confused, No new F.N deficits,   [place].AT,PERRAL Supple Neck,No JVD, No cervical lymphadenopathy appriciated.  Symmetrical Chest wall movement, Good air movement  bilaterally, CTAB RRR,No Gallops, Rubs or new Murmurs, No Parasternal Heave +ve B.Sounds, Abd Soft, No tenderness, No organomegaly appriciated, No rebound - guarding or rigidity. No Cyanosis, Clubbing or edema, No new Rash or bruise    Data Review:    CBC Recent Labs  Lab 04/25/20 2045 04/26/20 0251 04/27/20 0139 04/28/20 0134  WBC 9.4 7.7 11.2* 12.7*  HGB 14.2 13.6 12.5* 12.4*  HCT 42.7 40.8 36.4* 36.3*  PLT 179 179 166 181  MCV 89.9 90.3 88.1 89.9  MCH 29.9 30.1 30.3 30.7  MCHC 33.3 33.3 34.3 34.2  RDW 13.2 13.3 13.2 13.4  LYMPHSABS 0.7 0.9 1.2 0.4*  MONOABS 1.1* 0.7 0.2 0.1  EOSABS 0.1 0.0 0.0 0.4  BASOSABS 0.0 0.0 0.0 0.0    Recent Labs  Lab 04/25/20 2045 04/25/20 2227  04/26/20 0208 04/26/20 0251 04/26/20 0434 04/26/20 0628 04/26/20 0743 04/27/20 0139 04/28/20 0134  NA 135  --   --  139  --   --   --  139 139  K 3.6  --   --  3.8  --   --   --  4.0 4.3  CL 102  --   --  103  --   --   --  108 109  CO2 25  --   --  27  --   --   --  23 24  GLUCOSE 113*  --   --  113*  --   --   --  195* 173*  BUN 16  --   --  17  --   --   --  17 21  CREATININE 1.57*  --   --  1.29*  --   --   --  1.01 0.96  CALCIUM 8.6*  --   --  8.6*  --   --   --  8.5* 8.5*  AST 29  --   --  32  --   --   --  75* 173*  ALT 31  --   --  31  --   --   --  36 80*  ALKPHOS 59  --   --  49  --   --   --  47 44  BILITOT 1.4*  --   --  1.4*  --   --   --  0.5 1.0  ALBUMIN 3.7  --   --  3.4*  --   --   --  2.9* 3.0*  MG  --   --   --   --   --   --  1.7 2.2 2.2  CRP  --  14.3*  --  16.0*  --   --   --  17.3* 7.3*  DDIMER  --  1.83*  --  2.27*  --   --   --  0.63* 0.49  PROCALCITON  --  18.99  --   --   --   --  21.14 18.07 9.11  LATICACIDVEN  --   --  1.7  --  1.5  --   --   --   --   BNP  --   --   --   --   --  92.8  --   --   --     ------------------------------------------------------------------------------------------------------------------ Recent Labs    04/25/20 2227  TRIG 37    No results found for: HGBA1C ------------------------------------------------------------------------------------------------------------------ No results for input(s): TSH, T4TOTAL, T3FREE, THYROIDAB in the last  72 hours.  Invalid input(s): FREET3  Cardiac Enzymes No results for input(s): CKMB, TROPONINI, MYOGLOBIN in the last 168 hours.  Invalid input(s): CK ------------------------------------------------------------------------------------------------------------------    Component Value Date/Time   BNP 92.8 04/26/2020 0981    Micro Results Recent Results (from the past 240 hour(s))  Blood Culture (routine x 2)     Status: None (Preliminary result)   Collection Time: 04/25/20   2:05 AM   Specimen: BLOOD  Result Value Ref Range Status   Specimen Description BLOOD SITE NOT SPECIFIED  Final   Special Requests   Final    BOTTLES DRAWN AEROBIC AND ANAEROBIC Blood Culture adequate volume   Culture   Final    NO GROWTH 1 DAY Performed at [facility], [address], [place], [place] [address]    Report Status PENDING  Incomplete  Resp Panel by RT-PCR (Flu A&B, Covid) Nasopharyngeal Swab     Status: Abnormal   Collection Time: 04/26/20  1:50 AM   Specimen: Nasopharyngeal Swab; Nasopharyngeal(NP) swabs in vial transport medium  Result Value Ref Range Status   SARS Coronavirus 2 by RT PCR POSITIVE (A) NEGATIVE Final    Comment: RESULT CALLED TO, READ BACK BY AND VERIFIED WITH: D C RN 04/26/20 0507 JDW (NOTE) SARS-CoV-2 target nucleic acids are DETECTED.  The SARS-CoV-2 RNA is generally detectable in upper respiratory specimens during the acute phase of infection. Positive results are indicative of the presence of the identified virus, but do not rule out bacterial infection or co-infection with other pathogens not detected by the test. Clinical correlation with patient history and other diagnostic information is necessary to determine patient infection status. The expected result is Negative.  Fact Sheet for Patients: BloggerCourse.com  Fact Sheet for Healthcare Providers: SeriousBroker.it  This test is not yet approved or cleared by the [place] FDA and  has been authorized for detection and/or diagnosis of SARS-CoV-2 by FDA under an Emergency Use Authorization (EUA).  This EUA will remain in effect (meaning this test can be used) for  the duration of  the COVID-19 declaration under Section 564(b)(1) of the Act, 21 U.S.C. section 360bbb-3(b)(1), unless the authorization is terminated or revoked sooner.     Influenza A by PCR NEGATIVE NEGATIVE Final   Influenza B by PCR NEGATIVE NEGATIVE Final     Comment: (NOTE) The Xpert Xpress SARS-CoV-2/FLU/RSV plus assay is intended as an aid in the diagnosis of influenza from Nasopharyngeal swab specimens and should not be used as a sole basis for treatment. Nasal washings and aspirates are unacceptable for Xpert Xpress SARS-CoV-2/FLU/RSV testing.  Fact Sheet for Patients: BloggerCourse.com  Fact Sheet for Healthcare Providers: SeriousBroker.it  This test is not yet approved or cleared by the [place] FDA and has been authorized for detection and/or diagnosis of SARS-CoV-2 by FDA under an Emergency Use Authorization (EUA). This EUA will remain in effect (meaning this test can be used) for the duration of the COVID-19 declaration under Section 564(b)(1) of the Act, 21 U.S.C. section 360bbb-3(b)(1), unless the authorization is terminated or revoked.  Performed at [facility], [address] [place], [place] [address]   Blood Culture (routine x 2)     Status: None (Preliminary result)   Collection Time: 04/26/20  1:50 AM   Specimen: BLOOD  Result Value Ref Range Status   Specimen Description BLOOD SITE NOT SPECIFIED  Final   Special Requests   Final    BOTTLES DRAWN AEROBIC AND ANAEROBIC Blood Culture  adequate volume   Culture   Final    NO GROWTH 1 DAY Performed at [facility], [address] [place], [place] [id number]    Report Status PENDING  Incomplete    Radiology Reports DG Chest Port 1 View  Result Date: 04/27/2020 CLINICAL DATA:  75 year old male with a history of dyspnea. History of COVID EXAM: PORTABLE CHEST 1 VIEW COMPARISON:  04/26/2020, 04/25/2020 FINDINGS: Cardiomediastinal silhouette unchanged in size and contour. Mild tortuosity of the thoracic aorta. No pneumothorax or pleural effusion. No interlobular septal thickening. Mild reticulonodular opacities at the left greater than right lung bases persist. No new confluent airspace disease. No  displaced fracture IMPRESSION: Similar appearance of the lungs with mild basilar reticulonodular opacity compatible with a history of COVID and no new confluent airspace disease or edema Electronically Signed   By: Gilmer MorJaime  Wagner D.O.   On: 04/27/2020 14:14   DG Chest Port 1 View  Result Date: 04/26/2020 CLINICAL DATA:  Dyspnea, COVID pneumonia EXAM: PORTABLE CHEST 1 VIEW COMPARISON:  None. FINDINGS: Pulmonary insufflation is normal and symmetric. Left basilar focal pulmonary infiltrate has improved slightly in the interval. No pneumothorax or pleural effusion. Cardiac size within normal limits. Pulmonary vascularity is normal. IMPRESSION: Improving left basilar infiltrate Electronically Signed   By: Helyn NumbersAshesh  Parikh MD   On: 04/26/2020 06:49   DG Chest Portable 1 View  Result Date: 04/25/2020 CLINICAL DATA:  Cough and fever.  COVID positive yesterday. EXAM: PORTABLE CHEST 1 VIEW COMPARISON:  None. FINDINGS: Low lung volumes. Patchy heterogeneous bilateral airspace opacities in a mid-lower lung zone predominant distribution. Heart is normal in size with normal mediastinal contours. Aortic atherosclerosis. No pleural effusion, pneumothorax, or evidence of pneumomediastinum. No acute osseous abnormalities are seen. IMPRESSION: Patchy heterogeneous bilateral airspace opacities in a mid-lower lung zone predominant distribution, pattern typical of COVID-19. Electronically Signed   By: Narda RutherfordMelanie  Sanford M.D.   On: 04/25/2020 20:24

## 2020-04-28 NOTE — Plan of Care (Signed)
  Problem: Education: Goal: Knowledge of risk factors and measures for prevention of condition will improve Outcome: Not Applicable   

## 2020-04-29 DIAGNOSIS — J9601 Acute respiratory failure with hypoxia: Secondary | ICD-10-CM

## 2020-04-29 DIAGNOSIS — N179 Acute kidney failure, unspecified: Secondary | ICD-10-CM

## 2020-04-29 LAB — COMPREHENSIVE METABOLIC PANEL
ALT: 108 U/L — ABNORMAL HIGH (ref 0–44)
AST: 96 U/L — ABNORMAL HIGH (ref 15–41)
Albumin: 3.3 g/dL — ABNORMAL LOW (ref 3.5–5.0)
Alkaline Phosphatase: 52 U/L (ref 38–126)
Anion gap: 10 (ref 5–15)
BUN: 21 mg/dL (ref 8–23)
CO2: 24 mmol/L (ref 22–32)
Calcium: 9.2 mg/dL (ref 8.9–10.3)
Chloride: 106 mmol/L (ref 98–111)
Creatinine, Ser: 0.93 mg/dL (ref 0.61–1.24)
GFR, Estimated: >60 mL/min (ref >60)
Glucose, Bld: 140 mg/dL — ABNORMAL HIGH (ref 70–99)
Potassium: 4.1 mmol/L (ref 3.5–5.1)
Sodium: 140 mmol/L (ref 135–145)
Total Bilirubin: 0.7 mg/dL (ref 0.3–1.2)
Total Protein: 6.2 g/dL — ABNORMAL LOW (ref 6.5–8.1)

## 2020-04-29 LAB — CBC WITH DIFFERENTIAL/PLATELET
Abs Immature Granulocytes: 0.06 10*3/uL (ref 0.00–0.07)
Basophils Absolute: 0 10*3/uL (ref 0.0–0.1)
Basophils Relative: 0 %
Eosinophils Absolute: 0.1 10*3/uL (ref 0.0–0.5)
Eosinophils Relative: 1 %
HCT: 42.7 % (ref 39.0–52.0)
Hemoglobin: 14.6 g/dL (ref 13.0–17.0)
Immature Granulocytes: 0 %
Lymphocytes Relative: 7 %
Lymphs Abs: 1.2 10*3/uL (ref 0.7–4.0)
MCH: 30.3 pg (ref 26.0–34.0)
MCHC: 34.2 g/dL (ref 30.0–36.0)
MCV: 88.6 fL (ref 80.0–100.0)
Monocytes Absolute: 1 10*3/uL (ref 0.1–1.0)
Monocytes Relative: 6 %
Neutro Abs: 15.1 10*3/uL — ABNORMAL HIGH (ref 1.7–7.7)
Neutrophils Relative %: 86 %
Platelets: 239 10*3/uL (ref 150–400)
RBC: 4.82 MIL/uL (ref 4.22–5.81)
RDW: 13.2 % (ref 11.5–15.5)
WBC: 17.5 10*3/uL — ABNORMAL HIGH (ref 4.0–10.5)
nRBC: 0 % (ref 0.0–0.2)

## 2020-04-29 LAB — C-REACTIVE PROTEIN: CRP: 2.8 mg/dL — ABNORMAL HIGH (ref <1.0)

## 2020-04-29 LAB — MAGNESIUM: Magnesium: 2.1 mg/dL (ref 1.7–2.4)

## 2020-04-29 LAB — PROCALCITONIN: Procalcitonin: 3.32 ng/mL

## 2020-04-29 LAB — D-DIMER, QUANTITATIVE: D-Dimer, Quant: 0.45 ug/mL-FEU (ref 0.00–0.50)

## 2020-04-29 MED ORDER — AMOXICILLIN-POT CLAVULANATE 875-125 MG PO TABS: 1.0000 | ORAL_TABLET | Freq: Two times a day (BID) | ORAL | 0 refills | Status: AC

## 2020-04-29 MED ORDER — AZITHROMYCIN 500 MG PO TABS: 500.0000 mg | ORAL_TABLET | Freq: Every day | ORAL | 0 refills | Status: DC

## 2020-04-29 NOTE — Discharge Instructions (Signed)
10 Things You Can Do to Manage Your COVID-19 Symptoms at Home If you have possible or confirmed COVID-19: 1. Stay home except to get medical care. 2. Monitor your symptoms carefully. If your symptoms get worse, call your healthcare provider immediately. 3. Get rest and stay hydrated. 4. If you have a medical appointment, call the healthcare provider ahead of time and tell them that you have or may have COVID-19. 5. For medical emergencies, call 911 and notify the dispatch personnel that you have or may have COVID-19. 6. Cover your cough and sneezes with a tissue or use the inside of your elbow. 7. Wash your hands often with soap and water for at least 20 seconds or clean your hands with an alcohol-based hand sanitizer that contains at least 60% alcohol. 8. As much as possible, stay in a specific room and away from other people in your home. Also, you should use a separate bathroom, if available. If you need to be around other people in or outside of the home, wear a mask. 9. Avoid sharing personal items with other people in your household, like dishes, towels, and bedding. 10. Clean all surfaces that are touched often, like counters, tabletops, and doorknobs. Use household cleaning sprays or wipes according to the label instructions. [email or web address]/coronavirus 07/29/2019 This information is not intended to replace advice given to you by your health care provider. Make sure you discuss any questions you have with your health care provider. Document Revised: 11/14/2019 Document Reviewed: 11/14/2019 Elsevier Patient Education  2021 Elsevier Inc.   Acute Respiratory Failure, Adult Acute respiratory failure is a condition that is a medical emergency. It can develop quickly, and it should be treated right away. There are two types of acute respiratory failure:  Type I respiratory failure is when the lungs are not able to get enough oxygen into the blood. This causes the blood oxygen level to  drop.  Type II respiratory failure is when carbon dioxide is not passing from the lungs out of the body. This causes carbon dioxide to build up in the blood. A person may have one type of acute respiratory failure or have both types at the same time. What are the causes? Common causes of type I respiratory failure include:  Trauma to the lung, chest, ribs, or tissues around the lung.  Pneumonia.  Lung diseases, such as pulmonary fibrosis or asthma.  Smoke, chemical, or water inhalation.  A blood clot in the lungs (pulmonary embolism).  A blood infection (sepsis).  Heart attack. Common causes of type II respiratory failure include:  Stroke.  A spinal cord injury.  A drug or alcohol overdose.  A blood infection (sepsis).  Cardiac arrest. What increases the risk? This condition is more likely to develop in people who have:  Lung diseases such as asthma or chronic obstructive pulmonary disease (COPD).  A condition that damages or weakens the muscles, nerves, bones, or tissues that are involved in breathing, such as myasthenia gravis or Guillain-Barr syndrome.  A serious infection.  A health problem that blocks the unconscious reflex that is involved in breathing, such as hypothyroidism or sleep apnea. What are the signs or symptoms? Trouble breathing is the main symptom of acute respiratory failure. Symptoms may also include:  Fast breathing.  Restlessness or anxiety.  Breathing loudly (wheezing) and grunting.  Fast or irregular heartbeats (palpitations).  Confusion or changes in behavior.  Feeling tired (fatigue), sleeping more than normal, or being hard to wake.  Skin, lips, or fingernails  that appear blue (cyanosis). How is this diagnosed? This condition may be diagnosed based on:  Your medical history and a physical exam. Your health care provider will listen to your heart and lungs to check for abnormal sounds.  Tests to confirm the diagnosis and  determine the cause of respiratory failure. These tests may include: ? Measuring the amount of oxygen in your blood (pulse oximetry). The measurement comes from a small device that is placed on your finger, earlobe, or toe. ? Blood tests to measure blood oxygen and carbon dioxide and to look for signs of infection. ? Tests on a sample of the fluid that surrounds the spinal cord (cerebrospinal fluid) or a sample of fluid that is drawn from the windpipe (trachea) to check for infections. ? Chest X-ray. ? Electrocardiogram (ECG) to look at the heart's electrical activity.   How is this treated? Treatment for this condition usually takes place in a hospital intensive care unit (ICU). Treatment depends on what is causing the condition. It may include one or more of these treatments:  Oxygen may be given through your nose or a face mask.  A device such as a continuous positive airway pressure (CPAP) machine or bi-level positive airway pressure (BPAP) machine may be used to help you breathe. The device gives you oxygen and pressure.  Breathing treatments, fluids, and other medicines may be given.  A ventilator may be used to help you breathe. The machine gives you oxygen and pressure. A tube is put into your mouth and trachea to connect the ventilator. ? If this treatment is needed longer term, a tracheostomy may be placed. A tracheostomy is a breathing tube put through your neck into your trachea.  In extreme cases, extracorporeal life support (ECLS) may be used. This treatment temporarily takes over the function of the heart and lungs, supplying oxygen and removing carbon dioxide. ECLS gives the lungs a chance to recover. Follow these instructions at home: Medicines  Take over-the-counter and prescription medicines only as told by your health care provider.  If you were prescribed an antibiotic medicine, take it as told by your health care provider. Do not stop using the antibiotic even if you  start to feel better.  If you are taking blood thinners: ? Talk with your health care provider before you take any medicines that contain aspirin or NSAIDs, such as ibuprofen. These medicines increase your risk for dangerous bleeding. ? Take your medicine exactly as told, at the same time every day. ? Avoid activities that could cause injury or bruising, and follow instructions about how to prevent falls. ? Wear a medical alert bracelet or carry a card that lists what medicines you take. General instructions  Return to your normal activities as told by your health care provider. Ask your health care provider what activities are safe for you.  Do not use any products that contain nicotine or tobacco, such as cigarettes, e-cigarettes, and chewing tobacco. If you need help quitting, ask your health care provider.  Do not drink alcohol if: ? Your health care provider tells you not to drink. ? You are pregnant, may be pregnant, or are planning to become pregnant.  Wear compression stockings as told by your health care provider. These stockings help to prevent blood clots and reduce swelling in your legs.  Attend any physical therapy and pulmonary rehabilitation as told by your health care provider.  Keep all follow-up visits as told by your health care provider. This is important. How  is this prevented?  If you have an infection or a medical condition that may lead to acute respiratory failure, make sure you get proper treatment. Contact a health care provider if:  You have a fever.  Your symptoms do not improve or they get worse. Get help right away if:  You are having trouble breathing.  You lose consciousness.  You develop a fast heart rate.  Your fingers, lips, or other areas turn blue.  You are confused. These symptoms may represent a serious problem that is an emergency. Do not wait to see if the symptoms will go away. Get medical help right away. Call your local emergency  services (911 in the [place] Do not drive yourself to the hospital. Summary  Acute respiratory failure is a medical emergency. It can develop quickly, and it should be treated right away.  Treatment for this condition usually takes place in a hospital intensive care unit (ICU). Treatment may include oxygen, fluids, and medicines. A device may be used to help you breathe, such as a ventilator.  Take over-the-counter and prescription medicines only as told by your health care provider.  Contact a health care provider if your symptoms do not improve or if they get worse. This information is not intended to replace advice given to you by your health care provider. Make sure you discuss any questions you have with your health care provider. Document Revised: 12/17/2018 Document Reviewed: 12/17/2018 Elsevier Patient Education  2021 Elsevier Inc.  COVID-19 Quarantine vs. Isolation QUARANTINE keeps someone who was in close contact with someone who has COVID-19 away from others. Quarantine if you have been in close contact with someone who has COVID-19, unless you have been fully vaccinated. If you are fully vaccinated  You do NOT need to quarantine unless they have symptoms  Get tested 3-5 days after your exposure, even if you don't have symptoms  Wear a mask indoors in public for 14 days following exposure or until your test result is negative If you are not fully vaccinated  Stay home for 14 days after your last contact with a person who has COVID-19  Watch for fever (100.20F), cough, shortness of breath, or other symptoms of COVID-19  If possible, stay away from people you live with, especially people who are at higher risk for getting very sick from COVID-19  Contact your local public health department for options in your area to possibly shorten your quarantine ISOLATION keeps someone who is sick or tested positive for COVID-19 without symptoms away from others, even in their own  home. People who are in isolation should stay home and stay in a specific "sick room" or area and use a separate bathroom (if available). If you are sick and think or know you have COVID-19 Stay home until after  At least 10 days since symptoms first appeared and  At least 24 hours with no fever without the use of fever-reducing medications and  Symptoms have improved If you tested positive for COVID-19 but do not have symptoms  Stay home until after 10 days have passed since your positive viral test  If you develop symptoms after testing positive, follow the steps above for those who are sick [employer] 10/10/2019 This information is not intended to replace advice given to you by your health care provider. Make sure you discuss any questions you have with your health care provider. Document Revised: 11/14/2019 Document Reviewed: 11/14/2019 Elsevier Patient Education  2021 Elsevier Inc.  COVID-19: What to Do  if You Are Sick If you have a fever, cough or other symptoms, you might have COVID-19. Most people have mild illness and are able to recover at home. If you are sick:  Keep track of your symptoms.  If you have an emergency warning sign (including trouble breathing), call 911. Steps to help prevent the spread of COVID-19 if you are sick If you are sick with COVID-19 or think you might have COVID-19, follow the steps below to care for yourself and to help protect other people in your home and community. Stay home except to get medical care  Stay home. Most people with COVID-19 have mild illness and can recover at home without medical care. Do not leave your home, except to get medical care. Do not visit public areas.  Take care of yourself. Get rest and stay hydrated. Take over-the-counter medicines, such as acetaminophen, to help you feel better.  Stay in touch with your doctor. Call before you get medical care. Be sure to get care if you have trouble breathing, or  have any other emergency warning signs, or if you think it is an emergency.  Avoid public transportation, ride-sharing, or taxis. Separate yourself from other people As much as possible, stay in a specific room and away from other people and pets in your home. If possible, you should use a separate bathroom. If you need to be around other people or animals in or outside of the home, wear a mask. Tell your close contactsthat they may have been exposed to COVID-19. An infected person can spread COVID-19 starting 48 hours (or 2 days) before the person has any symptoms or tests positive. By letting your close contacts know they may have been exposed to COVID-19, you are helping to protect everyone.  Additional guidance is available for those living in close quarters and shared housing.  See COVID-19 and Animals if you have questions about pets.  If you are diagnosed with COVID-19, someone from the health department may call you. Answer the call to slow the spread. Monitor your symptoms  Symptoms of COVID-19 include fever, cough, or other symptoms.  Follow care instructions from your healthcare provider and local health department. Your local health authorities may give instructions on checking your symptoms and reporting information. When to seek emergency medical attention Look for emergency warning signs* for COVID-19. If someone is showing any of these signs, seek emergency medical care immediately:  Trouble breathing  Persistent pain or pressure in the chest  New confusion  Inability to wake or stay awake  Pale, gray, or blue-colored skin, lips, or nail beds, depending on skin tone *This list is not all possible symptoms. Please call your medical provider for any other symptoms that are severe or concerning to you. Call 911 or call ahead to your local emergency facility: Notify the operator that you are seeking care for someone who has or may have COVID-19. Call ahead before visiting  your doctor  Call ahead. Many medical visits for routine care are being postponed or done by phone or telemedicine.  If you have a medical appointment that cannot be postponed, call your doctor's office, and tell them you have or may have COVID-19. This will help the office protect themselves and other patients. Get  tested  If you have symptoms of COVID-19, get tested. While waiting for test results, you stay away from others, including staying apart from those living in your household.  You can visit your state, tribal, local, and territorialhealth  department's website to look for the latest local information on testing sites. If you are sick, wear a mask over your nose and mouth  You should wear a mask over your nose and mouth if you must be around other people or animals, including pets (even at home).  You don't need to wear the mask if you are alone. If you can't put on a mask (because of trouble breathing, for example), cover your coughs and sneezes in some other way. Try to stay at least 6 feet away from other people. This will help protect the people around you.  Masks should not be placed on young children under age 45 years, anyone who has trouble breathing, or anyone who is not able to remove the mask without help. Note: During the COVID-19 pandemic, medical grade facemasks are reserved for healthcare workers and some first responders. Cover your coughs and sneezes  Cover your mouth and nose with a tissue when you cough or sneeze.  Throw away used tissues in a lined trash can.  Immediately wash your hands with soap and water for at least 20 seconds. If soap and water are not available, clean your hands with an alcohol-based hand sanitizer that contains at least 60% alcohol. Clean your hands often  Wash your hands often with soap and water for at least 20 seconds. This is especially important after blowing your nose, coughing, or sneezing; going to the bathroom; and before  eating or preparing food.  Use hand sanitizer if soap and water are not available. Use an alcohol-based hand sanitizer with at least 60% alcohol, covering all surfaces of your hands and rubbing them together until they feel dry.  Soap and water are the best option, especially if hands are visibly dirty.  Avoid touching your eyes, nose, and mouth with unwashed hands.  Handwashing Tips Avoid sharing personal household items  Do not share dishes, drinking glasses, cups, eating utensils, towels, or bedding with other people in your home.  Wash these items thoroughly after using them with soap and water or put in the dishwasher. Clean all "high-touch" surfaces everyday  Clean and disinfect high-touch surfaces in your "sick room" and bathroom; wear disposable gloves. Let someone else clean and disinfect surfaces in common areas, but you should clean your bedroom and bathroom, if possible.  If a caregiver or other person needs to clean and disinfect a sick person's bedroom or bathroom, they should do so on an as-needed basis. The caregiver/other person should wear a mask and disposable gloves prior to cleaning. They should wait as long as possible after the person who is sick has used the bathroom before coming in to clean and use the bathroom. ? High-touch surfaces include phones, remote controls, counters, tabletops, doorknobs, bathroom fixtures, toilets, keyboards, tablets, and bedside tables.  Clean and disinfect areas that may have blood, stool, or body fluids on them.  Use household cleaners and disinfectants. Clean the area or item with soap and water or another detergent if it is dirty. Then, use a household disinfectant. ? Be sure to follow the instructions on the label to ensure safe and effective use of the product. Many products recommend keeping the surface wet for several minutes to ensure germs are killed. Many also recommend precautions such as wearing gloves and making sure you have  good ventilation during use of the product. ? Use a product from [employer] List N: Disinfectants for Coronavirus (COVID-19). ? Complete Disinfection Guidance When you can be around others after  being sick with COVID-19 Deciding when you can be around others is different for different situations. Find out when you can safely end home isolation. For any additional questions about your care, contact your healthcare provider or state or local health department. 03/30/2019 Content source: [facility] ([facility]), Division of Viral Diseases This information is not intended to replace advice given to you by your health care provider. Make sure you discuss any questions you have with your health care provider. Document Revised: 11/14/2019 Document Reviewed: 11/14/2019 Elsevier Patient Education  2021 Elsevier Inc. Follow with Primary MD  in 7 days   Get CBC, CMP, 2 view Chest X ray -  checked next visit within 1 week by Primary MD    Activity: As tolerated with Full fall precautions use walker/cane & assistance as needed  Disposition Home    Diet: Soft diet  with feeding assistance and aspiration precautions.    Special Instructions: If you have smoked or chewed Tobacco  in the last 2 yrs please stop smoking, stop any regular Alcohol  and or any Recreational drug use.  On your next visit with your primary care physician please Get Medicines reviewed and adjusted.  Please request your Prim.MD to go over all Hospital Tests and Procedure/Radiological results at the follow up, please get all Hospital records sent to your Prim MD by signing hospital release before you go home.  If you experience worsening of your admission symptoms, develop shortness of breath, life threatening emergency, suicidal or homicidal thoughts you must seek medical attention immediately by calling 911 or calling your MD immediately  if symptoms less severe.  You Must read complete  instructions/literature along with all the possible adverse reactions/side effects for all the Medicines you take and that have been prescribed to you. Take any new Medicines after you have completely understood and accpet all the possible adverse reactions/side effects.

## 2020-04-29 NOTE — Progress Notes (Signed)
Pharmacy Antibiotic Note  Jonathan Berry is a 75 y.o. male admitted on 04/25/2020 with COVID-19 pneumonia treatment.  Pharmacy has been consulted for Unasyn for aspiration PNA. Patient is also receiving 5-day course of azithromycin. Patient is now afebrile with WBC up to 17.5, procalcitonin is downtrending is 3.32 today (4/17). Today is day 4 of Unasyn therapy .   Plan: Continue Unasyn 3g IV q6h Monitor clinical progress, renal function, de-escalation. Follow up length of therapy   Height: 5\' 9"  (175.3 cm) Weight: 75 kg (165 lb 5.5 oz) IBW/kg (Calculated) : 70.7  Temp (24hrs), Avg:97.7 F (36.5 C), Min:97.7 F (36.5 C), Max:97.7 F (36.5 C)  Recent Labs  Lab 04/25/20 2045 04/26/20 0208 04/26/20 0251 04/26/20 0434 04/27/20 0139 04/28/20 0134 04/29/20 0749  WBC 9.4  --  7.7  --  11.2* 12.7* 17.5*  CREATININE 1.57*  --  1.29*  --  1.01 0.96 0.93  LATICACIDVEN  --  1.7  --  1.5  --   --   --     Estimated Creatinine Clearance: 69.7 mL/min (by C-G formula based on SCr of 0.93 mg/dL).    No Known Allergies  Antimicrobials this admission: Redemsivir 4/14>> (4/18) Azithromycin 4/14>>(4/19) Unasyn 4/14>>  Dose adjustments this admission: n/a  Microbiology results: 4/14 BCX: no growth 2 days  4/14 Covid: positive  (4/12 home test: positive)   03-08-1986, PharmD PGY1 Pharmacy Resident 04/29/2020 9:57 AM

## 2020-04-29 NOTE — Plan of Care (Signed)
  Problem: Respiratory: Goal: Will maintain a patent airway Outcome: Adequate for Discharge   Problem: Respiratory: Goal: Will maintain a patent airway Outcome: Adequate for Discharge   Problem: Respiratory: Goal: Complications related to the disease process, condition or treatment will be avoided or minimized Outcome: Adequate for Discharge

## 2020-04-29 NOTE — Discharge Summary (Signed)
Jonathan Berry RUE:[id number] DOB: [birth year] DOA: 04/25/2020  PCP: Pcp, No  Admit date: 04/25/2020  Discharge date: 04/29/2020  Admitted From: Home   Disposition:  Home   Recommendations for Outpatient Follow-up:   Follow up with PCP in 1-2 weeks  PCP Please obtain BMP/CBC, 2 view CXR in 1week,  (see Discharge instructions)   PCP Please follow up on the following pending results: Check CBC, CMP and a two-view chest x-ray in 7 to 10 days.   Home Health: None   Equipment/Devices: None  Consultations: None  Discharge Condition: Stable    CODE STATUS: Full    Diet Recommendation: Heart Healthy     Chief Complaint  Patient presents with  . Covid Fever    Dry Cough     Brief history of present illness from the day of admission and additional interim summary    Jonathan Berry a 75 y.o.male who has taken 3 shots of Covid mRNA vaccinewithhistory of hypertension, hyperlipidemia, dementia was brought to the ER after patient was becoming increasingly confused weak and increasing cough, fever, mild SOB, 1 episode of N&V, he was brought to ER by EMS found to be febrile, he had left he was also positive for COVID-19 infection but had very elevated procalcitonin levels.  He was admitted for pneumonia treatment.                                                                 Hospital Course    1. Acute Hypoxic Resp. Failure due to Acute Covid 19 Viral Pneumonitis + Bacterial PNA - he is fully immunized and boosted for COVID-19 infection, he does have gradually worsening dementia and is very high risk for aspiration.  Procalcitonin was elevated.  I think he has a mixed picture of bacterial and viral pneumonia.  He was treated with antibiotics along with steroids and Remdesivir, he completed his Paxlovid course which was  started in the outpatient setting he is now back to baseline, symptom-free on room air, will get 3 more days of oral antibiotics, was seen by speech and placed on soft diet.  Continue aspiration precautions at home with close outpatient follow-up by PCP within a week of discharge.    2.  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  Continue Aricept, minimize narcotics and benzodiazepines, resolved after he was placed on nighttime Seroquel low-dose, should be back to baseline at home.  3.  Dyslipidemia.  Continue statin.  4.  AKI.  Resolved after IVF.   Discharge diagnosis     Principal Problem:   Pneumonia due to COVID-19 virus Active Problems:   ARF (acute renal failure) (HCC)   DNR (do not resuscitate)   Essential hypertension    Discharge instructions    Discharge Instructions    Discharge instructions  Complete by: As directed    Follow with Primary MD  in 7 days   Get CBC, CMP, 2 view Chest X ray -  checked next visit within 1 week by Primary MD    Activity: As tolerated with Full fall precautions use walker/cane & assistance as needed  Disposition Home    Diet: Soft with feeding assistance and aspiration precautions.    Special Instructions: If you have smoked or chewed Tobacco  in the last 2 yrs please stop smoking, stop any regular Alcohol  and or any Recreational drug use.  On your next visit with your primary care physician please Get Medicines reviewed and adjusted.  Please request your Prim.MD to go over all Hospital Tests and Procedure/Radiological results at the follow up, please get all Hospital records sent to your Prim MD by signing hospital release before you go home.  If you experience worsening of your admission symptoms, develop shortness of breath, life threatening emergency, suicidal or homicidal thoughts you must seek medical attention immediately by calling 911 or calling your MD immediately  if symptoms less severe.  You Must  read complete instructions/literature along with all the possible adverse reactions/side effects for all the Medicines you take and that have been prescribed to you. Take any new Medicines after you have completely understood and accpet all the possible adverse reactions/side effects.   Increase activity slowly   Complete by: As directed    MyChart COVID-19 home monitoring program   Complete by: Apr 29, 2020    Is the patient willing to use the MyChart Mobile App for home monitoring?: Yes   Temperature monitoring   Complete by: Apr 29, 2020    After how many days would you like to receive a notification of this patient's flowsheet entries?: 1      Discharge Medications   Allergies as of 04/29/2020   No Known Allergies     Medication List    STOP taking these medications   Paxlovid 20 x 150 MG & 10 x 100MG  Tbpk Generic drug: Nirmatrelvir & Ritonavir     TAKE these medications   acetaminophen 500 MG tablet Commonly known as: TYLENOL Take 1,000 mg by mouth every 6 (six) hours as needed for fever.   Acetyl-L-Carnitine HCl Powd 1 capsule by Does not apply route daily.   Alpha-Lipoic Acid 100 MG Tabs Take 300 mg by mouth daily.   amoxicillin-clavulanate 875-125 MG tablet Commonly known as: Augmentin Take 1 tablet by mouth 2 (two) times daily for 3 days.   atorvastatin 20 MG tablet Commonly known as: LIPITOR Take 20 mg by mouth daily.   azithromycin 500 MG tablet Commonly known as: Zithromax Take 1 tablet (500 mg total) by mouth daily.   CENTRUM SILVER PO Take 1 tablet by mouth daily.   donepezil 10 MG tablet Commonly known as: ARICEPT Take 10 mg by mouth daily.   ibuprofen 200 MG tablet Commonly known as: ADVIL Take 400 mg by mouth every 6 (six) hours as needed for fever.   L-METHYLFOLATE PO Take 1 tablet by mouth daily.   lisinopril 5 MG tablet Commonly known as: ZESTRIL Take 5 mg by mouth daily.   OVER THE COUNTER MEDICATION Take 1 tablet by mouth daily.  Biomethyl folate complex   OVER THE COUNTER MEDICATION Take 1 tablet by mouth daily. Circumin complex   OVER THE COUNTER MEDICATION Take 1 drop by mouth daily. B12 methylcobalamin 1,000 liquid   Vitamin D3 125 MCG (5000 UT) Tabs  Take 5,000 Units by mouth daily.        Follow-up Information    PCP. Schedule an appointment as soon as possible for a visit in 1 week(s).               Major procedures and Radiology Reports - PLEASE review detailed and final reports thoroughly  -       DG Chest Port 1 View  Result Date: 04/27/2020 CLINICAL DATA:  75 year old male with a history of dyspnea. History of COVID EXAM: PORTABLE CHEST 1 VIEW COMPARISON:  04/26/2020, 04/25/2020 FINDINGS: Cardiomediastinal silhouette unchanged in size and contour. Mild tortuosity of the thoracic aorta. No pneumothorax or pleural effusion. No interlobular septal thickening. Mild reticulonodular opacities at the left greater than right lung bases persist. No new confluent airspace disease. No displaced fracture IMPRESSION: Similar appearance of the lungs with mild basilar reticulonodular opacity compatible with a history of COVID and no new confluent airspace disease or edema Electronically Signed   By: Gilmer Mor D.O.   On: 04/27/2020 14:14   DG Chest Port 1 View  Result Date: 04/26/2020 CLINICAL DATA:  Dyspnea, COVID pneumonia EXAM: PORTABLE CHEST 1 VIEW COMPARISON:  None. FINDINGS: Pulmonary insufflation is normal and symmetric. Left basilar focal pulmonary infiltrate has improved slightly in the interval. No pneumothorax or pleural effusion. Cardiac size within normal limits. Pulmonary vascularity is normal. IMPRESSION: Improving left basilar infiltrate Electronically Signed   By: Helyn Numbers MD   On: 04/26/2020 06:49   DG Chest Portable 1 View  Result Date: 04/25/2020 CLINICAL DATA:  Cough and fever.  COVID positive yesterday. EXAM: PORTABLE CHEST 1 VIEW COMPARISON:  None. FINDINGS: Low lung volumes.  Patchy heterogeneous bilateral airspace opacities in a mid-lower lung zone predominant distribution. Heart is normal in size with normal mediastinal contours. Aortic atherosclerosis. No pleural effusion, pneumothorax, or evidence of pneumomediastinum. No acute osseous abnormalities are seen. IMPRESSION: Patchy heterogeneous bilateral airspace opacities in a mid-lower lung zone predominant distribution, pattern typical of COVID-19. Electronically Signed   By: Narda Rutherford M.D.   On: 04/25/2020 20:24    Micro Results     Recent Results (from the past 240 hour(s))  Blood Culture (routine x 2)     Status: None (Preliminary result)   Collection Time: 04/25/20  2:05 AM   Specimen: BLOOD  Result Value Ref Range Status   Specimen Description BLOOD SITE NOT SPECIFIED  Final   Special Requests   Final    BOTTLES DRAWN AEROBIC AND ANAEROBIC Blood Culture adequate volume   Culture   Final    NO GROWTH 2 DAYS Performed at [facility], [address] [place], [place] [address]    Report Status PENDING  Incomplete  Resp Panel by RT-PCR (Flu A&B, Covid) Nasopharyngeal Swab     Status: Abnormal   Collection Time: 04/26/20  1:50 AM   Specimen: Nasopharyngeal Swab; Nasopharyngeal(NP) swabs in vial transport medium  Result Value Ref Range Status   SARS Coronavirus 2 by RT PCR POSITIVE (A) NEGATIVE Final    Comment: RESULT CALLED TO, READ BACK BY AND VERIFIED WITH: D C RN 04/26/20 0507 JDW (NOTE) SARS-CoV-2 target nucleic acids are DETECTED.  The SARS-CoV-2 RNA is generally detectable in upper respiratory specimens during the acute phase of infection. Positive results are indicative of the presence of the identified virus, but do not rule out bacterial infection or co-infection with other pathogens not detected by the test. Clinical correlation with patient history and other  diagnostic information is necessary to determine patient infection status. The expected result is Negative.  Fact  Sheet for Patients: BloggerCourse.com  Fact Sheet for Healthcare Providers: SeriousBroker.it  This test is not yet approved or cleared by the [place] FDA and  has been authorized for detection and/or diagnosis of SARS-CoV-2 by FDA under an Emergency Use Authorization (EUA).  This EUA will remain in effect (meaning this test can be used) for  the duration of  the COVID-19 declaration under Section 564(b)(1) of the Act, 21 U.S.C. section 360bbb-3(b)(1), unless the authorization is terminated or revoked sooner.     Influenza A by PCR NEGATIVE NEGATIVE Final   Influenza B by PCR NEGATIVE NEGATIVE Final    Comment: (NOTE) The Xpert Xpress SARS-CoV-2/FLU/RSV plus assay is intended as an aid in the diagnosis of influenza from Nasopharyngeal swab specimens and should not be used as a sole basis for treatment. Nasal washings and aspirates are unacceptable for Xpert Xpress SARS-CoV-2/FLU/RSV testing.  Fact Sheet for Patients: BloggerCourse.com  Fact Sheet for Healthcare Providers: SeriousBroker.it  This test is not yet approved or cleared by the [place] FDA and has been authorized for detection and/or diagnosis of SARS-CoV-2 by FDA under an Emergency Use Authorization (EUA). This EUA will remain in effect (meaning this test can be used) for the duration of the COVID-19 declaration under Section 564(b)(1) of the Act, 21 U.S.C. section 360bbb-3(b)(1), unless the authorization is terminated or revoked.  Performed at [facility], [address] [place] [place], [place] [address]   Blood Culture (routine x 2)     Status: None (Preliminary result)   Collection Time: 04/26/20  1:50 AM   Specimen: BLOOD  Result Value Ref Range Status   Specimen Description BLOOD SITE NOT SPECIFIED  Final   Special Requests   Final    BOTTLES DRAWN AEROBIC AND ANAEROBIC Blood Culture adequate  volume   Culture   Final    NO GROWTH 2 DAYS Performed at [facility], [address] [place], [place] [address]    Report Status PENDING  Incomplete    Today   Subjective    Torres Hardenbrook today has no headache,no chest abdominal pain,no new weakness tingling or numbness, feels much better wants to go home today.     Objective   Blood pressure (!) 159/70, pulse 87, temperature 97.7 F (36.5 C), temperature source Oral, resp. rate 16, height  (1.753 m), weight 75 kg, SpO2 95 %.   Intake/Output Summary (Last 24 hours) at 04/29/2020 1011 Last data filed at 04/28/2020 1313 Gross per 24 hour  Intake 840 ml  Output --  Net 840 ml    Exam  Awake mildly confused, No new F.N deficits,   [place].AT,PERRAL Supple Neck,No JVD, No cervical lymphadenopathy appriciated.  Symmetrical Chest wall movement, Good air movement bilaterally, CTAB RRR,No Gallops,Rubs or new Murmurs, No Parasternal Heave +ve B.Sounds, Abd Soft, Non tender, No organomegaly appriciated, No rebound -guarding or rigidity. No Cyanosis, Clubbing or edema, No new Rash or bruise   Data Review   CBC w Diff:  Lab Results  Component Value Date   WBC 17.5 (H) 04/29/2020   HGB 14.6 04/29/2020   HCT 42.7 04/29/2020   PLT 239 04/29/2020   LYMPHOPCT 7 04/29/2020   MONOPCT 6 04/29/2020   EOSPCT 1 04/29/2020   BASOPCT 0 04/29/2020    CMP:  Lab Results  Component Value Date   NA 140 04/29/2020   K 4.1 04/29/2020  CL 106 04/29/2020   CO2 24 04/29/2020   BUN 21 04/29/2020   CREATININE 0.93 04/29/2020   PROT 6.2 (L) 04/29/2020   ALBUMIN 3.3 (L) 04/29/2020   BILITOT 0.7 04/29/2020   ALKPHOS 52 04/29/2020   AST 96 (H) 04/29/2020   ALT 108 (H) 04/29/2020  .   Total Time in preparing paper work, data evaluation and todays exam - 35 minutes  Susa Raring M.D on 04/29/2020 at 10:11 AM  [facility]

## 2020-05-01 LAB — CULTURE, BLOOD (ROUTINE X 2)
Culture: NO GROWTH
Culture: NO GROWTH
Special Requests: ADEQUATE
Special Requests: ADEQUATE

## 2020-05-07 ENCOUNTER — Other Ambulatory Visit (HOSPITAL_COMMUNITY): Payer: Self-pay | Admitting: Internal Medicine

## 2020-05-07 DIAGNOSIS — Z8701 Personal history of pneumonia (recurrent): Secondary | ICD-10-CM

## 2020-05-08 ENCOUNTER — Ambulatory Visit (HOSPITAL_COMMUNITY)
Admission: RE | Admit: 2020-05-08 | Discharge: 2020-05-08 | Disposition: A | Discharge status: Home / Self Care | Payer: Medicare Other | Source: Ambulatory Visit | Attending: Internal Medicine | Admitting: Internal Medicine

## 2020-05-08 ENCOUNTER — Other Ambulatory Visit: Payer: Self-pay

## 2020-05-08 DIAGNOSIS — Z8701 Personal history of pneumonia (recurrent): Secondary | ICD-10-CM | POA: Insufficient documentation

## 2021-01-02 ENCOUNTER — Emergency Department (HOSPITAL_COMMUNITY)
Admission: EM | Admit: 2021-01-02 | Discharge: 2021-01-02 | Disposition: A | Discharge status: Rehabilitation Facility | Payer: Medicare Other | Attending: Emergency Medicine | Admitting: Emergency Medicine

## 2021-01-02 ENCOUNTER — Other Ambulatory Visit: Payer: Self-pay

## 2021-01-02 ENCOUNTER — Emergency Department (HOSPITAL_COMMUNITY): Payer: Medicare Other

## 2021-01-02 DIAGNOSIS — S0181XA Laceration without foreign body of other part of head, initial encounter: Secondary | ICD-10-CM | POA: Insufficient documentation

## 2021-01-02 DIAGNOSIS — I10 Essential (primary) hypertension: Secondary | ICD-10-CM | POA: Diagnosis not present

## 2021-01-02 DIAGNOSIS — G309 Alzheimer's disease, unspecified: Secondary | ICD-10-CM | POA: Diagnosis not present

## 2021-01-02 DIAGNOSIS — W19XXXA Unspecified fall, initial encounter: Secondary | ICD-10-CM | POA: Insufficient documentation

## 2021-01-02 DIAGNOSIS — F0390 Unspecified dementia without behavioral disturbance: Secondary | ICD-10-CM | POA: Diagnosis not present

## 2021-01-02 DIAGNOSIS — S01511A Laceration without foreign body of lip, initial encounter: Secondary | ICD-10-CM | POA: Diagnosis not present

## 2021-01-02 DIAGNOSIS — S0993XA Unspecified injury of face, initial encounter: Secondary | ICD-10-CM | POA: Diagnosis present

## 2021-01-02 DIAGNOSIS — Z8616 Personal history of COVID-19: Secondary | ICD-10-CM | POA: Diagnosis not present

## 2021-01-02 LAB — CBC WITH DIFFERENTIAL/PLATELET
Abs Immature Granulocytes: 0.12 10*3/uL — ABNORMAL HIGH (ref 0.00–0.07)
Basophils Absolute: 0 10*3/uL (ref 0.0–0.1)
Basophils Relative: 0 %
Eosinophils Absolute: 0.1 10*3/uL (ref 0.0–0.5)
Eosinophils Relative: 1 %
HCT: 44.6 % (ref 39.0–52.0)
Hemoglobin: 14.8 g/dL (ref 13.0–17.0)
Immature Granulocytes: 1 %
Lymphocytes Relative: 10 %
Lymphs Abs: 0.9 10*3/uL (ref 0.7–4.0)
MCH: 28.8 pg (ref 26.0–34.0)
MCHC: 33.2 g/dL (ref 30.0–36.0)
MCV: 86.9 fL (ref 80.0–100.0)
Monocytes Absolute: 0.5 10*3/uL (ref 0.1–1.0)
Monocytes Relative: 5 %
Neutro Abs: 7.9 10*3/uL — ABNORMAL HIGH (ref 1.7–7.7)
Neutrophils Relative %: 83 %
Platelets: 260 10*3/uL (ref 150–400)
RBC: 5.13 MIL/uL (ref 4.22–5.81)
RDW: 13.1 % (ref 11.5–15.5)
WBC: 9.5 10*3/uL (ref 4.0–10.5)
nRBC: 0 % (ref 0.0–0.2)

## 2021-01-02 LAB — URINALYSIS, ROUTINE W REFLEX MICROSCOPIC
Bacteria, UA: NONE SEEN
Bilirubin Urine: NEGATIVE
Glucose, UA: NEGATIVE mg/dL
Hgb urine dipstick: NEGATIVE
Ketones, ur: 20 mg/dL — AB
Leukocytes,Ua: NEGATIVE
Nitrite: NEGATIVE
Protein, ur: 30 mg/dL — AB
Specific Gravity, Urine: 1.023 (ref 1.005–1.030)
pH: 5 (ref 5.0–8.0)

## 2021-01-02 LAB — BASIC METABOLIC PANEL
Anion gap: 9 (ref 5–15)
BUN: 13 mg/dL (ref 8–23)
CO2: 30 mmol/L (ref 22–32)
Calcium: 9.5 mg/dL (ref 8.9–10.3)
Chloride: 105 mmol/L (ref 98–111)
Creatinine, Ser: 0.98 mg/dL (ref 0.61–1.24)
GFR, Estimated: >60 mL/min (ref >60)
Glucose, Bld: 121 mg/dL — ABNORMAL HIGH (ref 70–99)
Potassium: 4.2 mmol/L (ref 3.5–5.1)
Sodium: 144 mmol/L (ref 135–145)

## 2021-01-02 MED ORDER — LIDOCAINE HCL (PF) 1 % IJ SOLN
10.0000 mL | Freq: Once | INTRAMUSCULAR | Status: AC
  Administered 2021-01-02: 13:00:00 10 mL
  Filled 2021-01-02: qty 10

## 2021-01-02 NOTE — ED Triage Notes (Signed)
Pt arrives from [place] [facility] for unwitnessed fall this morning with no reported LOC. Lac to chin and right side of lip. Pt at baseline  mental status. Collar in place

## 2021-01-02 NOTE — Discharge Instructions (Addendum)
It was a pleasure taking care of you today.  As discussed you will need your sutures removed in roughly 7 days.  Keep area clean.  He may take over-the-counter Tylenol as needed for pain.  Please follow-up with PCP within 1 week for further evaluation.  Return to the ER for any worsening symptoms.

## 2021-01-02 NOTE — ED Notes (Signed)
Called report to [facility].

## 2021-01-02 NOTE — ED Provider Notes (Signed)
[facility] EMERGENCY DEPARTMENT Provider Note   CSN: [id number] Arrival date & time: 01/02/21  1118     History Chief Complaint  Patient presents with   Jonathan Berry is a 75 y.o. male with a past medical history significant for Alzheimer's, hypercholesteremia, and hypertension who presents to the ED after an unwitnessed fall.  Patient is a resident at [facility].  Patient's wife is at bedside.  Per wife, patient was found on the floor after a suspected fall.  Wife notes that patient is at baseline.  Per wife, patient recently finished a dose of Flagyl due to bloody diarrhea which is suspected to be infectious in nature.  Patient sustained laceration to right side of lip and chin.  Wife notes that patient had a tetanus booster within the past year.  Patient is not currently on any anticoagulants.  No loss of consciousness documented by living facility staff.  C-collar placed by EMS.  Level 5 caveat secondary to history of dementia.  History obtained from patient, wife, and past medical records. No interpreter used during encounter.       Past Medical History:  Diagnosis Date   Alzheimer disease ([place])    COVID    Hypercholesteremia    Hypertension     Patient Active Problem List   Diagnosis Date Noted   Pneumonia due to COVID-19 virus 04/25/2020   ARF (acute renal failure) ([place]) 04/25/2020   DNR (do not resuscitate) 04/25/2020   Essential hypertension 04/25/2020    Past Surgical History:  Procedure Laterality Date   KNEE SURGERY     MANDIBLE SURGERY         Family History  Problem Relation Age of Onset   Diabetes Mellitus II Neg Hx     Social History   Tobacco Use   Smoking status: Never   Smokeless tobacco: Never  Substance Use Topics   Alcohol use: Yes    Comment: 3 glasses of wine a week   Drug use: Never    Home Medications Prior to Admission medications   Medication Sig Start Date End Date Taking? Authorizing Provider   acetaminophen (TYLENOL) 500 MG tablet Take 1,000 mg by mouth every 6 (six) hours as needed for fever.    [provider]  Acetylcarnitine HCl (ACETYL-L-CARNITINE HCL) POWD 1 capsule by Does not apply route daily.    [provider]  Alpha-Lipoic Acid 100 MG TABS Take 300 mg by mouth daily.    [provider]  atorvastatin (LIPITOR) 20 MG tablet Take 20 mg by mouth daily. 12/02/19   [provider]  azithromycin (ZITHROMAX) 500 MG tablet Take 1 tablet (500 mg total) by mouth daily. 04/29/20   Thurnell Lose, MD  Cholecalciferol (VITAMIN D3) 125 MCG (5000 UT) TABS Take 5,000 Units by mouth daily.    [provider]  donepezil (ARICEPT) 10 MG tablet Take 10 mg by mouth daily. 03/27/20   [provider]  ibuprofen (ADVIL) 200 MG tablet Take 400 mg by mouth every 6 (six) hours as needed for fever.    [provider]  L-METHYLFOLATE PO Take 1 tablet by mouth daily.    [provider]  lisinopril (ZESTRIL) 5 MG tablet Take 5 mg by mouth daily. 12/02/19   [provider]  Multiple Vitamins-Minerals (CENTRUM SILVER PO) Take 1 tablet by mouth daily.    [provider]  OVER THE COUNTER MEDICATION Take 1 tablet by mouth daily. Biomethyl folate  complex    [provider]  OVER THE COUNTER MEDICATION Take 1 tablet by mouth daily. Circumin complex    [provider]  OVER THE COUNTER MEDICATION Take 1 drop by mouth daily. B12 methylcobalamin 1,000 liquid    [provider]    Allergies    Patient has no known allergies.  Review of Systems   Review of Systems  Unable to perform ROS: Dementia   Physical Exam Updated Vital Signs BP (!) 146/75    Pulse 92    Temp 98 F (36.7 C)    Resp 18    Ht 5\' 9"  (1.753 m)    Wt 75 kg    SpO2 99%    BMI 24.42 kg/m   Physical Exam Vitals and nursing note reviewed.  Constitutional:      General: He is not in acute distress.    Appearance: He is not  ill-appearing.  HENT:     Head: Normocephalic.     Mouth/Throat:     Comments: 1.5cm laceration to right lower lip. 1cm laceration to chin.  Eyes:     Pupils: Pupils are equal, round, and reactive to light.  Neck:     Comments: C-collar in place. No cervical midline tenderness. Cardiovascular:     Rate and Rhythm: Normal rate and regular rhythm.     Pulses: Normal pulses.     Heart sounds: Normal heart sounds. No murmur heard.   No friction rub. No gallop.  Pulmonary:     Effort: Pulmonary effort is normal.     Breath sounds: Normal breath sounds.  Abdominal:     General: Abdomen is flat. There is no distension.     Palpations: Abdomen is soft.     Tenderness: There is no abdominal tenderness. There is no guarding or rebound.  Musculoskeletal:        General: Normal range of motion.     Cervical back: Neck supple.     Comments: No thoracic or lumbar midline tenderness. No bony tenderness throughout bilateral hips.   Skin:    General: Skin is warm and dry.  Neurological:     General: No focal deficit present.     Mental Status: He is alert.  Psychiatric:        Mood and Affect: Mood normal.        Behavior: Behavior normal.    ED Results / Procedures / Treatments   Labs (all labs ordered are listed, but only abnormal results are displayed) Labs Reviewed  CBC WITH DIFFERENTIAL/PLATELET - Abnormal; Notable for the following components:      Result Value   Neutro Abs 7.9 (*)    Abs Immature Granulocytes 0.12 (*)    All other components within normal limits  BASIC METABOLIC PANEL - Abnormal; Notable for the following components:   Glucose, Bld 121 (*)    All other components within normal limits  URINALYSIS, ROUTINE W REFLEX MICROSCOPIC - Abnormal; Notable for the following components:   APPearance HAZY (*)    Ketones, ur 20 (*)    Protein, ur 30 (*)    Non Squamous Epithelial 0-5 (*)    All other components within normal limits    EKG None  Radiology CT Head Wo  Contrast  Result Date: 01/02/2021 CLINICAL DATA:  Provided history: Head trauma, minor. Facial trauma, blunt. Neck trauma. Additional history provided: Fall, laceration to chin and lip. EXAM: CT HEAD WITHOUT CONTRAST CT MAXILLOFACIAL WITHOUT CONTRAST CT CERVICAL SPINE WITHOUT  CONTRAST TECHNIQUE: Multidetector CT imaging of the head, cervical spine, and maxillofacial structures were performed using the standard protocol without intravenous contrast. Multiplanar CT image reconstructions of the cervical spine and maxillofacial structures were also generated. COMPARISON:  No pertinent prior exams available for comparison. FINDINGS: CT HEAD FINDINGS Brain: Moderate generalized cerebral atrophy. Comparatively mild cerebellar atrophy. Commensurate prominence of the ventricles and sulci. Mild ill-defined hypoattenuation within the cerebral white matter, nonspecific but compatible with chronic small vessel ischemic disease. There is no acute intracranial hemorrhage. No demarcated cortical infarct. No extra-axial fluid collection. No evidence of an intracranial mass. No midline shift. Vascular: No hyperdense vessel. Atherosclerotic calcifications. Skull: Normal. Negative for fracture or focal lesion. Other: No significant mastoid effusion. CT MAXILLOFACIAL FINDINGS Osseous: No acute maxillofacial fracture is identified. Orbits: No acute or significant orbital finding. Sinuses: Tiny mucous retention cyst and trace mucosal thickening within the right maxillary sinus. Trace mucosal thickening within the left maxillary sinus. Fluid opacification of the right ethmoid air cell. Soft tissues: No definite soft tissue swelling/hematoma appreciable by CT. CT CERVICAL SPINE FINDINGS Alignment: Mild cervicothoracic levocurvature. No significant spondylolisthesis. Skull base and vertebrae: The basion-dental and atlanto-dental intervals are maintained.No evidence of acute fracture to the cervical spine. At C4-C5, there is fusion across  the disc space and facet joint ankylosis. Multilevel ventral osteophytes. Soft tissues and spinal canal: No prevertebral fluid or swelling. No visible canal hematoma. Disc levels: Cervical spondylosis with multilevel disc space narrowing, disc bulges/central disc protrusions, posterior disc osteophytes, endplate spurring, uncovertebral hypertrophy and facet arthrosis. No appreciable high-grade spinal canal stenosis. Multilevel bony neural foraminal narrowing. Upper chest: No consolidation within the imaged lung apices. No visible pneumothorax. Other: Calcified plaque within the visualized proximal major branch vessels of the neck and both carotid arteries. IMPRESSION: CT head: 1. No evidence of acute intracranial abnormality. 2. Mild chronic small vessel ischemic changes within the cerebral white matter. 3. Moderate generalized cerebral atrophy. Comparatively mild cerebellar atrophy. CT maxillofacial: 1. No evidence of acute maxillofacial fracture. 2. Mild paranasal sinus disease, as described. CT cervical spine: 1. No evidence of acute fracture to the cervical spine. 2. Mild cervicothoracic levocurvature. 3. Cervical spondylosis, as described. 4. C4-C5 ankylosis. Electronically Signed   By: Kellie Simmering D.O.   On: 01/02/2021 13:22   CT Cervical Spine Wo Contrast  Result Date: 01/02/2021 CLINICAL DATA:  Provided history: Head trauma, minor. Facial trauma, blunt. Neck trauma. Additional history provided: Fall, laceration to chin and lip. EXAM: CT HEAD WITHOUT CONTRAST CT MAXILLOFACIAL WITHOUT CONTRAST CT CERVICAL SPINE WITHOUT CONTRAST TECHNIQUE: Multidetector CT imaging of the head, cervical spine, and maxillofacial structures were performed using the standard protocol without intravenous contrast. Multiplanar CT image reconstructions of the cervical spine and maxillofacial structures were also generated. COMPARISON:  No pertinent prior exams available for comparison. FINDINGS: CT HEAD FINDINGS Brain: Moderate  generalized cerebral atrophy. Comparatively mild cerebellar atrophy. Commensurate prominence of the ventricles and sulci. Mild ill-defined hypoattenuation within the cerebral white matter, nonspecific but compatible with chronic small vessel ischemic disease. There is no acute intracranial hemorrhage. No demarcated cortical infarct. No extra-axial fluid collection. No evidence of an intracranial mass. No midline shift. Vascular: No hyperdense vessel. Atherosclerotic calcifications. Skull: Normal. Negative for fracture or focal lesion. Other: No significant mastoid effusion. CT MAXILLOFACIAL FINDINGS Osseous: No acute maxillofacial fracture is identified. Orbits: No acute or significant orbital finding. Sinuses: Tiny mucous retention cyst and trace mucosal thickening within the right maxillary sinus. Trace mucosal thickening within the left maxillary  sinus. Fluid opacification of the right ethmoid air cell. Soft tissues: No definite soft tissue swelling/hematoma appreciable by CT. CT CERVICAL SPINE FINDINGS Alignment: Mild cervicothoracic levocurvature. No significant spondylolisthesis. Skull base and vertebrae: The basion-dental and atlanto-dental intervals are maintained.No evidence of acute fracture to the cervical spine. At C4-C5, there is fusion across the disc space and facet joint ankylosis. Multilevel ventral osteophytes. Soft tissues and spinal canal: No prevertebral fluid or swelling. No visible canal hematoma. Disc levels: Cervical spondylosis with multilevel disc space narrowing, disc bulges/central disc protrusions, posterior disc osteophytes, endplate spurring, uncovertebral hypertrophy and facet arthrosis. No appreciable high-grade spinal canal stenosis. Multilevel bony neural foraminal narrowing. Upper chest: No consolidation within the imaged lung apices. No visible pneumothorax. Other: Calcified plaque within the visualized proximal major branch vessels of the neck and both carotid arteries.  IMPRESSION: CT head: 1. No evidence of acute intracranial abnormality. 2. Mild chronic small vessel ischemic changes within the cerebral white matter. 3. Moderate generalized cerebral atrophy. Comparatively mild cerebellar atrophy. CT maxillofacial: 1. No evidence of acute maxillofacial fracture. 2. Mild paranasal sinus disease, as described. CT cervical spine: 1. No evidence of acute fracture to the cervical spine. 2. Mild cervicothoracic levocurvature. 3. Cervical spondylosis, as described. 4. C4-C5 ankylosis. Electronically Signed   By: Kellie Simmering D.O.   On: 01/02/2021 13:22   CT Maxillofacial Wo Contrast  Result Date: 01/02/2021 CLINICAL DATA:  Provided history: Head trauma, minor. Facial trauma, blunt. Neck trauma. Additional history provided: Fall, laceration to chin and lip. EXAM: CT HEAD WITHOUT CONTRAST CT MAXILLOFACIAL WITHOUT CONTRAST CT CERVICAL SPINE WITHOUT CONTRAST TECHNIQUE: Multidetector CT imaging of the head, cervical spine, and maxillofacial structures were performed using the standard protocol without intravenous contrast. Multiplanar CT image reconstructions of the cervical spine and maxillofacial structures were also generated. COMPARISON:  No pertinent prior exams available for comparison. FINDINGS: CT HEAD FINDINGS Brain: Moderate generalized cerebral atrophy. Comparatively mild cerebellar atrophy. Commensurate prominence of the ventricles and sulci. Mild ill-defined hypoattenuation within the cerebral white matter, nonspecific but compatible with chronic small vessel ischemic disease. There is no acute intracranial hemorrhage. No demarcated cortical infarct. No extra-axial fluid collection. No evidence of an intracranial mass. No midline shift. Vascular: No hyperdense vessel. Atherosclerotic calcifications. Skull: Normal. Negative for fracture or focal lesion. Other: No significant mastoid effusion. CT MAXILLOFACIAL FINDINGS Osseous: No acute maxillofacial fracture is identified.  Orbits: No acute or significant orbital finding. Sinuses: Tiny mucous retention cyst and trace mucosal thickening within the right maxillary sinus. Trace mucosal thickening within the left maxillary sinus. Fluid opacification of the right ethmoid air cell. Soft tissues: No definite soft tissue swelling/hematoma appreciable by CT. CT CERVICAL SPINE FINDINGS Alignment: Mild cervicothoracic levocurvature. No significant spondylolisthesis. Skull base and vertebrae: The basion-dental and atlanto-dental intervals are maintained.No evidence of acute fracture to the cervical spine. At C4-C5, there is fusion across the disc space and facet joint ankylosis. Multilevel ventral osteophytes. Soft tissues and spinal canal: No prevertebral fluid or swelling. No visible canal hematoma. Disc levels: Cervical spondylosis with multilevel disc space narrowing, disc bulges/central disc protrusions, posterior disc osteophytes, endplate spurring, uncovertebral hypertrophy and facet arthrosis. No appreciable high-grade spinal canal stenosis. Multilevel bony neural foraminal narrowing. Upper chest: No consolidation within the imaged lung apices. No visible pneumothorax. Other: Calcified plaque within the visualized proximal major branch vessels of the neck and both carotid arteries. IMPRESSION: CT head: 1. No evidence of acute intracranial abnormality. 2. Mild chronic small vessel ischemic changes within the cerebral white  matter. 3. Moderate generalized cerebral atrophy. Comparatively mild cerebellar atrophy. CT maxillofacial: 1. No evidence of acute maxillofacial fracture. 2. Mild paranasal sinus disease, as described. CT cervical spine: 1. No evidence of acute fracture to the cervical spine. 2. Mild cervicothoracic levocurvature. 3. Cervical spondylosis, as described. 4. C4-C5 ankylosis. Electronically Signed   By: Kellie Simmering D.O.   On: 01/02/2021 13:22    Procedures .Marland KitchenLaceration Repair  Date/Time: 01/02/2021 2:20 PM Performed  by: Suzy Bouchard, PA-C Authorized by: Suzy Bouchard, PA-C   Consent:    Consent obtained:  Verbal   Consent given by:  Patient   Risks discussed:  Infection, need for additional repair, pain, poor cosmetic result and poor wound healing   Alternatives discussed:  No treatment and delayed treatment Universal protocol:    Procedure explained and questions answered to patient or proxy's satisfaction: yes     Relevant documents present and verified: yes     Test results available: yes     Imaging studies available: yes     Required blood products, implants, devices, and special equipment available: yes     Site/side marked: yes     Immediately prior to procedure, a time out was called: yes     Patient identity confirmed:  Verbally with patient Anesthesia:    Anesthesia method:  Local infiltration   Local anesthetic:  Lidocaine 1% w/o epi Laceration details:    Location:  Lip   Lip location:  Lower exterior lip   Length (cm):  1.5   Depth (mm):  1 Pre-procedure details:    Preparation:  Patient was prepped and draped in usual sterile fashion and imaging obtained to evaluate for foreign bodies Exploration:    Limited defect created (wound extended): no     Hemostasis achieved with:  Direct pressure   Imaging obtained comment:  CT   Imaging outcome: foreign body not noted     Wound exploration: wound explored through full range of motion and entire depth of wound visualized     Wound extent: no underlying fracture noted     Contaminated: no   Treatment:    Area cleansed with:  Saline   Amount of cleaning:  Standard   Irrigation solution:  Sterile saline   Irrigation volume:  60   Irrigation method:  Syringe   Visualized foreign bodies/material removed: no     Debridement:  None   Undermining:  None   Scar revision: no   Skin repair:    Repair method:  Sutures   Suture size:  5-0   Suture material:  Prolene   Suture technique:  Simple interrupted   Number of  sutures:  3 Approximation:    Approximation:  Close   Vermilion border well-aligned: yes   Repair type:    Repair type:  Simple Post-procedure details:    Dressing:  Open (no dressing)   Procedure completion:  Tolerated well, no immediate complications .Marland KitchenLaceration Repair  Date/Time: 01/02/2021 2:22 PM Performed by: Suzy Bouchard, PA-C Authorized by: Suzy Bouchard, PA-C   Consent:    Consent obtained:  Verbal   Consent given by:  Patient   Risks discussed:  Infection, need for additional repair, pain, poor cosmetic result and poor wound healing   Alternatives discussed:  No treatment and delayed treatment Universal protocol:    Procedure explained and questions answered to patient or proxy's satisfaction: yes     Relevant documents present and verified: yes     Test  results available: yes     Imaging studies available: yes     Required blood products, implants, devices, and special equipment available: yes     Site/side marked: yes     Immediately prior to procedure, a time out was called: yes     Patient identity confirmed:  Verbally with patient Anesthesia:    Anesthesia method:  Local infiltration   Local anesthetic:  Lidocaine 1% w/o epi Laceration details:    Location:  Face   Face location:  Chin   Length (cm):  0.5   Depth (mm):  1 Pre-procedure details:    Preparation:  Patient was prepped and draped in usual sterile fashion and imaging obtained to evaluate for foreign bodies Exploration:    Limited defect created (wound extended): no     Hemostasis achieved with:  Direct pressure   Imaging obtained comment:  CT   Imaging outcome: foreign body not noted     Wound exploration: wound explored through full range of motion and entire depth of wound visualized     Wound extent: no underlying fracture noted     Contaminated: no   Treatment:    Area cleansed with:  Saline   Amount of cleaning:  Standard   Irrigation solution:  Sterile saline    Irrigation volume:  50   Irrigation method:  Syringe   Visualized foreign bodies/material removed: no     Debridement:  None   Undermining:  None   Scar revision: no   Skin repair:    Repair method:  Sutures   Suture size:  5-0   Suture material:  Prolene   Suture technique:  Simple interrupted   Number of sutures:  2 Approximation:    Approximation:  Close Repair type:    Repair type:  Simple Post-procedure details:    Dressing:  Open (no dressing)   Procedure completion:  Tolerated well, no immediate complications   Medications Ordered in ED Medications  lidocaine (PF) (XYLOCAINE) 1 % injection 10 mL (10 mLs Infiltration Given by Other 01/02/21 1325)    ED Course  I have reviewed the triage vital signs and the nursing notes.  Pertinent labs & imaging results that were available during my care of the patient were reviewed by me and considered in my medical decision making (see chart for details).    MDM Rules/Calculators/A&P                         75 year old male presents to the ED after an unwitnessed fall at [facility].  Patient has a history of Alzheimer's disease.  He sustained 2 lacerations to lower lip and chin.  Last tetanus shot was updated within the past year per wife at bedside.  He is not currently on any blood thinners.  No loss of consciousness documented by living facility staff. Patient is at baseline per wife. Stable vitals. Patient in no acute distress. 2 lacerations on to chin and lower lip. C-collar in place. No cervical, thoracic, or lumbar midline tenderness. No tenderness to bilateral hips. Routine labs and UA to rule out other causes of fall given it was unwitnessed. CT head, cervical spine, and maxillofacial ordered.   CT head and cervical spine personally reviewed which are negative for any acute abnormalities.  Suture repair as noted above.  Discussed suture care with wife at bedside.  Advised patient to take over-the-counter Tylenol as needed for  pain. Patient able to ambulate in the  ED without difficulty. Strict ED precautions discussed with patient. Patient states understanding and agrees to plan. Patient discharged home in no acute distress and stable vitals.  Discussed case with Dr. Criss Alvine who evaluated patient at bedside and agrees with assessment and plan.      Final Clinical Impression(s) / ED Diagnoses Final diagnoses:  Fall, initial encounter  Facial laceration, initial encounter    Rx / DC Orders ED Discharge Orders     None        Jesusita Oka 01/02/21 1427    Pricilla Loveless, MD 01/03/21 [phone]

## 2021-01-06 ENCOUNTER — Other Ambulatory Visit: Payer: Self-pay

## 2021-01-06 ENCOUNTER — Inpatient Hospital Stay (HOSPITAL_COMMUNITY)
Admission: EM | Admit: 2021-01-06 | Discharge: 2021-01-10 | DRG: 175 | Disposition: A | Discharge status: Skilled Nursing Facility | Payer: Medicare Other | Source: Skilled Nursing Facility | Attending: Internal Medicine | Admitting: Internal Medicine

## 2021-01-06 ENCOUNTER — Emergency Department (HOSPITAL_COMMUNITY): Payer: Medicare Other

## 2021-01-06 DIAGNOSIS — Z20822 Contact with and (suspected) exposure to covid-19: Secondary | ICD-10-CM | POA: Diagnosis present

## 2021-01-06 DIAGNOSIS — Z79899 Other long term (current) drug therapy: Secondary | ICD-10-CM

## 2021-01-06 DIAGNOSIS — E7800 Pure hypercholesterolemia, unspecified: Secondary | ICD-10-CM | POA: Diagnosis present

## 2021-01-06 DIAGNOSIS — J1282 Pneumonia due to coronavirus disease 2019: Secondary | ICD-10-CM

## 2021-01-06 DIAGNOSIS — F02811 Dementia in other diseases classified elsewhere, unspecified severity, with agitation: Secondary | ICD-10-CM | POA: Diagnosis present

## 2021-01-06 DIAGNOSIS — R296 Repeated falls: Secondary | ICD-10-CM | POA: Diagnosis present

## 2021-01-06 DIAGNOSIS — I2699 Other pulmonary embolism without acute cor pulmonale: Secondary | ICD-10-CM | POA: Diagnosis present

## 2021-01-06 DIAGNOSIS — I2602 Saddle embolus of pulmonary artery with acute cor pulmonale: Secondary | ICD-10-CM | POA: Diagnosis not present

## 2021-01-06 DIAGNOSIS — E43 Unspecified severe protein-calorie malnutrition: Secondary | ICD-10-CM | POA: Insufficient documentation

## 2021-01-06 DIAGNOSIS — I10 Essential (primary) hypertension: Secondary | ICD-10-CM | POA: Diagnosis present

## 2021-01-06 DIAGNOSIS — I2609 Other pulmonary embolism with acute cor pulmonale: Secondary | ICD-10-CM | POA: Diagnosis present

## 2021-01-06 DIAGNOSIS — Z66 Do not resuscitate: Secondary | ICD-10-CM | POA: Diagnosis present

## 2021-01-06 DIAGNOSIS — Z8616 Personal history of COVID-19: Secondary | ICD-10-CM | POA: Diagnosis not present

## 2021-01-06 DIAGNOSIS — G309 Alzheimer's disease, unspecified: Secondary | ICD-10-CM

## 2021-01-06 DIAGNOSIS — I50810 Right heart failure, unspecified: Secondary | ICD-10-CM | POA: Diagnosis present

## 2021-01-06 DIAGNOSIS — F0280 Dementia in other diseases classified elsewhere without behavioral disturbance: Secondary | ICD-10-CM | POA: Diagnosis not present

## 2021-01-06 DIAGNOSIS — R0902 Hypoxemia: Secondary | ICD-10-CM | POA: Diagnosis present

## 2021-01-06 DIAGNOSIS — I959 Hypotension, unspecified: Secondary | ICD-10-CM | POA: Diagnosis present

## 2021-01-06 DIAGNOSIS — Z681 Body mass index (BMI) 19 or less, adult: Secondary | ICD-10-CM | POA: Diagnosis not present

## 2021-01-06 DIAGNOSIS — R55 Syncope and collapse: Secondary | ICD-10-CM | POA: Diagnosis present

## 2021-01-06 LAB — CBC WITH DIFFERENTIAL/PLATELET
Abs Immature Granulocytes: 0.03 10*3/uL (ref 0.00–0.07)
Basophils Absolute: 0 10*3/uL (ref 0.0–0.1)
Basophils Relative: 0 %
Eosinophils Absolute: 0.1 10*3/uL (ref 0.0–0.5)
Eosinophils Relative: 1 %
HCT: 44.1 % (ref 39.0–52.0)
Hemoglobin: 14.1 g/dL (ref 13.0–17.0)
Immature Granulocytes: 0 %
Lymphocytes Relative: 11 %
Lymphs Abs: 1.1 10*3/uL (ref 0.7–4.0)
MCH: 28.7 pg (ref 26.0–34.0)
MCHC: 32 g/dL (ref 30.0–36.0)
MCV: 89.6 fL (ref 80.0–100.0)
Monocytes Absolute: 0.7 10*3/uL (ref 0.1–1.0)
Monocytes Relative: 8 %
Neutro Abs: 7.7 10*3/uL (ref 1.7–7.7)
Neutrophils Relative %: 80 %
Platelets: 159 10*3/uL (ref 150–400)
RBC: 4.92 MIL/uL (ref 4.22–5.81)
RDW: 13.3 % (ref 11.5–15.5)
WBC: 9.6 10*3/uL (ref 4.0–10.5)
nRBC: 0 % (ref 0.0–0.2)

## 2021-01-06 LAB — CBC
HCT: 42 % (ref 39.0–52.0)
Hemoglobin: 13.6 g/dL (ref 13.0–17.0)
MCH: 28.6 pg (ref 26.0–34.0)
MCHC: 32.4 g/dL (ref 30.0–36.0)
MCV: 88.2 fL (ref 80.0–100.0)
Platelets: 145 10*3/uL — ABNORMAL LOW (ref 150–400)
RBC: 4.76 MIL/uL (ref 4.22–5.81)
RDW: 13.4 % (ref 11.5–15.5)
WBC: 8.8 10*3/uL (ref 4.0–10.5)
nRBC: 0 % (ref 0.0–0.2)

## 2021-01-06 LAB — MAGNESIUM: Magnesium: 1.8 mg/dL (ref 1.7–2.4)

## 2021-01-06 LAB — GLUCOSE, CAPILLARY: Glucose-Capillary: 102 mg/dL — ABNORMAL HIGH (ref 70–99)

## 2021-01-06 LAB — BASIC METABOLIC PANEL
Anion gap: 9 (ref 5–15)
BUN: 13 mg/dL (ref 8–23)
CO2: 28 mmol/L (ref 22–32)
Calcium: 8.7 mg/dL — ABNORMAL LOW (ref 8.9–10.3)
Chloride: 108 mmol/L (ref 98–111)
Creatinine, Ser: 0.93 mg/dL (ref 0.61–1.24)
GFR, Estimated: >60 mL/min (ref >60)
Glucose, Bld: 136 mg/dL — ABNORMAL HIGH (ref 70–99)
Potassium: 3.9 mmol/L (ref 3.5–5.1)
Sodium: 145 mmol/L (ref 135–145)

## 2021-01-06 LAB — RESP PANEL BY RT-PCR (FLU A&B, COVID) ARPGX2
Influenza A by PCR: NEGATIVE
Influenza B by PCR: NEGATIVE
SARS Coronavirus 2 by RT PCR: NEGATIVE

## 2021-01-06 LAB — PROTIME-INR
INR: 1.3 — ABNORMAL HIGH (ref 0.8–1.2)
Prothrombin Time: 16.1 seconds — ABNORMAL HIGH (ref 11.4–15.2)

## 2021-01-06 LAB — TROPONIN I (HIGH SENSITIVITY)
Troponin I (High Sensitivity): 496 ng/L (ref <18)
Troponin I (High Sensitivity): 595 ng/L (ref <18)

## 2021-01-06 LAB — MRSA NEXT GEN BY PCR, NASAL: MRSA by PCR Next Gen: NOT DETECTED

## 2021-01-06 LAB — LACTIC ACID, PLASMA: Lactic Acid, Venous: 1.7 mmol/L (ref 0.5–1.9)

## 2021-01-06 MED ORDER — SERTRALINE HCL 50 MG PO TABS
25.0000 mg | ORAL_TABLET | Freq: Every day | ORAL | Status: DC
  Administered 2021-01-07 – 2021-01-10 (×4): 25 mg via ORAL
  Filled 2021-01-06 (×4): qty 1

## 2021-01-06 MED ORDER — HEPARIN (PORCINE) 25000 UT/250ML-% IV SOLN
1000.0000 [IU]/h | INTRAVENOUS | Status: DC
  Administered 2021-01-06: 18:00:00 1000 [IU]/h via INTRAVENOUS
  Filled 2021-01-06: qty 250

## 2021-01-06 MED ORDER — HEPARIN BOLUS VIA INFUSION
4000.0000 [IU] | Freq: Once | INTRAVENOUS | Status: AC
  Administered 2021-01-06: 18:00:00 4000 [IU] via INTRAVENOUS
  Filled 2021-01-06: qty 4000

## 2021-01-06 MED ORDER — ALTEPLASE (ACTIVASE) 10MG BOLUS + 40 MG INFUSION
50.0000 mg | Freq: Once | INTRAVENOUS | Status: AC
  Administered 2021-01-06: 19:00:00 50 mg via INTRAVENOUS
  Filled 2021-01-06: qty 50

## 2021-01-06 MED ORDER — POLYETHYLENE GLYCOL 3350 17 G PO PACK: 17.0000 g | PACK | Freq: Every day | ORAL | Status: DC | PRN

## 2021-01-06 MED ORDER — DOCUSATE SODIUM 100 MG PO CAPS: 100.0000 mg | ORAL_CAPSULE | Freq: Two times a day (BID) | ORAL | Status: DC | PRN

## 2021-01-06 MED ORDER — QUETIAPINE FUMARATE 25 MG PO TABS
25.0000 mg | ORAL_TABLET | Freq: Every day | ORAL | Status: DC
  Administered 2021-01-07 – 2021-01-10 (×4): 25 mg via ORAL
  Filled 2021-01-06 (×4): qty 1

## 2021-01-06 MED ORDER — CHLORHEXIDINE GLUCONATE CLOTH 2 % EX PADS
6.0000 | MEDICATED_PAD | Freq: Every day | CUTANEOUS | Status: DC
  Administered 2021-01-07: 01:00:00 6 via TOPICAL

## 2021-01-06 MED ORDER — IOHEXOL 350 MG/ML SOLN
80.0000 mL | Freq: Once | INTRAVENOUS | Status: AC | PRN
  Administered 2021-01-06: 17:00:00 80 mL via INTRAVENOUS

## 2021-01-06 MED ORDER — MIRTAZAPINE 15 MG PO TABS
15.0000 mg | ORAL_TABLET | Freq: Every day | ORAL | Status: DC
  Administered 2021-01-06 – 2021-01-09 (×4): 15 mg via ORAL
  Filled 2021-01-06 (×5): qty 1

## 2021-01-06 MED ORDER — DONEPEZIL HCL 10 MG PO TABS
10.0000 mg | ORAL_TABLET | Freq: Every day | ORAL | Status: DC
  Administered 2021-01-06 – 2021-01-09 (×4): 10 mg via ORAL
  Filled 2021-01-06 (×5): qty 1

## 2021-01-06 MED ORDER — SODIUM CHLORIDE 0.9 % IV SOLN
250.0000 mL | Freq: Once | INTRAVENOUS | Status: AC
  Administered 2021-01-06: 22:00:00 250 mL via INTRAVENOUS

## 2021-01-06 NOTE — ED Triage Notes (Signed)
Pt BIB EMS due to 2 syncopal events. Pt lives at [facility] care unit and syncopal episodes lasted 1-2 minutes. Pt was in a seated position so did not have any trauma associated with event. When ems arrived pt was 88% on RA and 60's systolic. Pt got of NS with EMS. Pt has dementia at baseline and has been declining over the past week.

## 2021-01-06 NOTE — ED Provider Notes (Signed)
[facility] EMERGENCY DEPARTMENT Provider Note   CSN: [id number] Arrival date & time: 01/06/21  1349     History CC: Hypotension, near syncope vs syncope   Jonathan Berry is a 75 y.o. male with history of advanced Alzheimer's dementia, presenting from a memory care facility [place] with concern for possible syncopal episode.  Per EMS report, the patient was in a seated position eating a meal, when he suddenly became unresponsive.  This may have lasted 1 to 2 minutes.  It is not clear if he lost pulses at this time if he is simply not responding.  When EMS arrived the patient was 80% on room air, had a systolic blood pressure around 60.  They gave him 250 cc of fluid in total.  His blood pressure then improved to 90 systolic.  The patient has had progressive decline of his dementia over the past several months, worsening this week, with generalized weakness.  He cannot provide any further history.  His wife is in route to the hospital.  HPI     Past Medical History:  Diagnosis Date   Alzheimer disease (HCC)    COVID    Hypercholesteremia    Hypertension     Patient Active Problem List   Diagnosis Date Noted   Acute pulmonary embolism with acute cor pulmonale (HCC) 01/06/2021   Pneumonia due to COVID-19 virus 04/25/2020   ARF (acute renal failure) (HCC) 04/25/2020   DNR (do not resuscitate) 04/25/2020   Essential hypertension 04/25/2020    Past Surgical History:  Procedure Laterality Date   KNEE SURGERY     MANDIBLE SURGERY         Family History  Problem Relation Age of Onset   Diabetes Mellitus II Neg Hx     Social History   Tobacco Use   Smoking status: Never   Smokeless tobacco: Never  Substance Use Topics   Alcohol use: Yes    Comment: 3 glasses of wine a week   Drug use: Never    Home Medications Prior to Admission medications   Medication Sig Start Date End Date Taking? Authorizing Provider  donepezil (ARICEPT) 10 MG tablet Take 10  mg by mouth at bedtime.   Yes [provider]  LORazepam (ATIVAN) 1 MG tablet Take 0.5 mg by mouth 2 (two) times daily as needed for anxiety.   Yes [provider]  mirtazapine (REMERON) 15 MG tablet Take 15 mg by mouth at bedtime.   Yes [provider]  QUEtiapine (SEROQUEL) 25 MG tablet Take 25 mg by mouth daily.   Yes [provider]  sertraline (ZOLOFT) 25 MG tablet Take 25 mg by mouth daily.   Yes [provider]    Allergies    Patient has no known allergies.  Review of Systems   Review of Systems  Unable to perform ROS: Dementia (level 5 caveat)   Physical Exam Updated Vital Signs BP 90/74    Pulse (!) 54    Resp 13    Ht 5\' 8"  (1.727 m)    Wt 59 kg    SpO2 98%    BMI 19.77 kg/m   Physical Exam Constitutional:      General: He is not in acute distress. HENT:     Head: Normocephalic and atraumatic.  Eyes:     Conjunctiva/sclera: Conjunctivae normal.     Pupils: Pupils are equal, round, and reactive to light.  Cardiovascular:     Rate and Rhythm: Normal rate  and regular rhythm.  Pulmonary:     Effort: Pulmonary effort is normal. No respiratory distress.  Abdominal:     General: There is no distension.     Tenderness: There is no abdominal tenderness.  Skin:    General: Skin is warm and dry.  Neurological:     General: No focal deficit present.     Mental Status: He is alert. Mental status is at baseline.    ED Results / Procedures / Treatments   Labs (all labs ordered are listed, but only abnormal results are displayed) Labs Reviewed  BASIC METABOLIC PANEL - Abnormal; Notable for the following components:      Result Value   Glucose, Bld 136 (*)    Calcium 8.7 (*)    All other components within normal limits  GLUCOSE, CAPILLARY - Abnormal; Notable for the following components:   Glucose-Capillary 102 (*)    All other components within normal limits  TROPONIN I (HIGH SENSITIVITY) - Abnormal; Notable for the  following components:   Troponin I (High Sensitivity) 496 (*)    All other components within normal limits  TROPONIN I (HIGH SENSITIVITY) - Abnormal; Notable for the following components:   Troponin I (High Sensitivity) 595 (*)    All other components within normal limits  RESP PANEL BY RT-PCR (FLU A&B, COVID) ARPGX2  CBC WITH DIFFERENTIAL/PLATELET  MAGNESIUM  URINALYSIS, ROUTINE W REFLEX MICROSCOPIC  BRAIN NATRIURETIC PEPTIDE  CBC  BASIC METABOLIC PANEL  MAGNESIUM  PHOSPHORUS  PROTIME-INR  CBC  LACTIC ACID, PLASMA  APTT    EKG EKG Interpretation  Date/Time:  Sunday January 06 2021 14:06:10 EST Ventricular Rate:  91 PR Interval:  145 QRS Duration: 104 QT Interval:  369 QTC Calculation: 454 R Axis:   -12 Text Interpretation: Sinus rhythm Borderline low voltage, extremity leads RSR' in V1 or V2, right VCD or RVH Confirmed by Alvester Chou [phone]) on 01/06/2021 2:10:30 PM  Radiology CT Angio Chest PE W and/or Wo Contrast  Result Date: 01/06/2021 CLINICAL DATA:  Loss of consciousness with 2 syncopal episodes. Concern for pulmonary embolus EXAM: CT ANGIOGRAPHY CHEST WITH CONTRAST TECHNIQUE: Multidetector CT imaging of the chest was performed using the standard protocol during bolus administration of intravenous contrast. Multiplanar CT image reconstructions and MIPs were obtained to evaluate the vascular anatomy. CONTRAST:  4mL OMNIPAQUE IOHEXOL 350 MG/ML SOLN COMPARISON:  Chest radiograph May 08, 2020 FINDINGS: Cardiovascular: Pulmonary emboli in the bilateral lobar pulmonary arteries with extension into segmental and subsegmental branches. The right ventricular to left ventricular ratio is 2.4. Normal size heart. No significant pericardial effusion/thickening. Aortic atherosclerosis without aneurysmal dilation. Mediastinum/Nodes: No discrete thyroid nodule. No pathologically enlarged mediastinal, hilar or axillary lymph nodes. The trachea and esophagus are grossly  unremarkable. Lungs/Pleura: Biapical pleuroparenchymal scarring. No suspicious pulmonary nodules or masses. No focal airspace consolidation. No pleural effusion. No pneumothorax. Upper Abdomen: No acute abnormality. Musculoskeletal: Multilevel degenerative changes spine. No acute osseous abnormality. Review of the MIP images confirms the above findings. IMPRESSION: 1. Positive for acute PE with CT evidence of right heart strain (RV/LV Ratio = 2.4) consistent with at least submassive (intermediate risk) PE. The presence of right heart strain has been associated with an increased risk of morbidity and mortality. 2. Aortic Atherosclerosis (ICD10-I70.0). These results were called by telephone at the time of interpretation on 01/06/2021 at 5:18 pm to provider PA Sol Passer, who verbally acknowledged these results. Electronically Signed   By: Maudry Mayhew M.D.   On: 01/06/2021 17:22  DG Chest Portable 1 View  Result Date: 01/06/2021 CLINICAL DATA:  evaluate for aspiration EXAM: PORTABLE CHEST 1 VIEW COMPARISON:  May 08, 2020 FINDINGS: The cardiomediastinal silhouette is unchanged in contour. No pleural effusion. No pneumothorax. No acute pleuroparenchymal abnormality. Visualized abdomen is unremarkable. Multilevel degenerative changes of the thoracic spine. IMPRESSION: No acute cardiopulmonary abnormality. Electronically Signed   By: Meda Klinefelter M.D.   On: 01/06/2021 15:00    Procedures .Critical Care Performed by: Terald Sleeper, MD Authorized by: Terald Sleeper, MD   Critical care provider statement:    Critical care time (minutes):  50   Critical care time was exclusive of:  Separately billable procedures and treating other patients   Critical care was necessary to treat or prevent imminent or life-threatening deterioration of the following conditions:  Circulatory failure   Critical care was time spent personally by me on the following activities:  Ordering and performing  treatments and interventions, ordering and review of laboratory studies, ordering and review of radiographic studies, pulse oximetry, review of old charts, examination of patient and evaluation of patient's response to treatment   Care discussed with: admitting provider   Comments:     Thrombolytics administered for large PE Patient reassessed multiple times for respiratory status throughout his stay.  Goals of care discussion with his wife at bedside   Medications Ordered in ED Medications  docusate sodium (COLACE) capsule 100 mg (has no administration in time range)  polyethylene glycol (MIRALAX / GLYCOLAX) packet 17 g (has no administration in time range)  0.9 %  sodium chloride infusion (has no administration in time range)  alteplase (ACTIVASE) 10 mg bolus + 40 mg infusion (50 mg Intravenous Given 01/06/21 1839)  donepezil (ARICEPT) tablet 10 mg (has no administration in time range)  mirtazapine (REMERON) tablet 15 mg (has no administration in time range)  QUEtiapine (SEROQUEL) tablet 25 mg (has no administration in time range)  sertraline (ZOLOFT) tablet 25 mg (has no administration in time range)  iohexol (OMNIPAQUE) 350 MG/ML injection 80 mL (80 mLs Intravenous Contrast Given 01/06/21 1711)  heparin bolus via infusion 4,000 Units (4,000 Units Intravenous Bolus from Bag 01/06/21 1748)    ED Course  I have reviewed the triage vital signs and the nursing notes.  Pertinent labs & imaging results that were available during my care of the patient were reviewed by me and considered in my medical decision making (see chart for details).  This patient presents to the Emergency Department with complaint of altered mental status.  This involves an extensive number of treatment options, and is a complaint that carries with it a high risk of complications and morbidity.  The differential diagnosis includes hypoglycemia vs metabolic encephalopathy vs infection (including cystitis) vs ICH vs stroke  vs polypharmacy vs ACS vs PE vs other  I ordered, reviewed, and interpreted labs, including troponin elevated in the 400s and 500s on repeat. I ordered imaging studies which included x-ray of the chest and CT pulmonary embolism scan I independently visualized and interpreted imaging which showed large bilateral segmental and subsegmental pulmonary embolisms with right heart strain, and the monitor tracing which showed sinus rhythm Additional history was obtained from patient's wife at bedside, who is his power of attorney. I personally reviewed the patients ECG which showed sinus rhythm with no acute ischemic findings  I consulted critical care team and discussed lab and imaging findings  After the interventions stated above, I reevaluated the patient and found his blood pressure  and vital signs remained stable.  Critical care team determined in conjunction with discussion with the family members, to proceed with thrombolytics.  I did have a goals of care discussion with the patient's wife at bedside, who confirmed that he is DNR and DNI, but would like to be consulted regarding further management options.  Clinical Course as of 01/06/21 2025  Wynelle Link Jan 06, 2021  1629 Wife updated at bedside - repeat trop pending [MT]  1650 2nd trop pending, CTPE ordered [MT]  1724 Large PE with signs of RH strain on CT [MT]  1731 Heparin ordered, wife updated [MT]  1754 Paged critical care regarding workup  [MT]  1832 Admitted to critical care, thrombolytics initiated [MT]    Clinical Course User Index [MT] Verlie Hellenbrand, Kermit Balo, MD    Final Clinical Impression(s) / ED Diagnoses Final diagnoses:  Acute pulmonary embolism with acute cor pulmonale, unspecified pulmonary embolism type [facility])    Rx / DC Orders ED Discharge Orders     None        Terald Sleeper, MD 01/06/21 2026

## 2021-01-06 NOTE — H&P (Signed)
NAME:  Jonathan Berry, MRN:  [id number], DOB:  May 12, 1945, LOS: 0 ADMISSION DATE:  01/06/2021, CONSULTATION DATE:  01/06/2021 REFERRING MD:  Dr. Renaye Rakers, CHIEF COMPLAINT:  PE with right heart strain    History of Present Illness:  Jonathan Berry is a 75 y.o. male with a PMH significant for alzheimer disease, HLD, and HTN who preseted to the emergency department after syncopal with AMS.  Per family patient had episode of unresponsiveness today during dinner.  Spouse reports patient was sitting in chair when he suddenly became less responsive and then suddenly became pale and his eyes rolled back for a brief period.  On EMS arrival patient was seen hypotensive and hypoxic  Patient was seen hemodynamically stable on ED arrival with borderline low blood pressure.  Given concern for PE CTA chest was obtained and revealed bilateral PEs with evidence of right heart strain (RV/LV 2.4) this coupled with borderline hypotension and elevated troponin critical care was consulted for possible need of intervention to PE.  Pertinent  Medical History  Alzheimer's disease Hypertension Hyperlipidemia  Significant Hospital Events: Including procedures, antibiotic start and stop dates in addition to other pertinent events   12/25 presented to the ED with syncopal episode found to lateral submassive PE with evidence of right heart strain  Interim History / Subjective:  Lying comfortably in bed with no acute distress.  On room air Spouse updated at bedside  Objective   Blood pressure 101/76, pulse 95, resp. rate 16, height 5\' 8"  (1.727 m), weight 59 kg, SpO2 96 %.       No intake or output data in the 24 hours ending 01/06/21 1746 Filed Weights   01/06/21 1406  Weight: 59 kg    Examination: General: Thin chronic ill-appearing mildly deconditioned elderly male lying in bed in no acute distress  HEENT: [place]/AT, MM pink/moist, PERRL,  Neuro: Alert and oriented, underlying confusion with history of Alzheimer's  disease CV: s1s2 regular rate and rhythm, no murmur, rubs, or gallops,  PULM: Clear to auscultation bilaterally no increased work of breathing, no added breath sounds GI: soft, bowel sounds active in all 4 quadrants, non-tender, non-distended Extremities: warm/dry, no edema  Skin: no rashes or lesions  Resolved Hospital Problem list     Assessment & Plan:  Bilateral PE with evidence of right heart strain on CT -CTA chest was obtained and revealed bilateral PEs with evidence of right heart strain (RV/LV 2.4) -PESI score elevated given age, systolic blood pressure, and sex Elevated troponin -Troponin 496 on arrival P: No current absolute or relative contraindications to systemic lytics Patient did fall a few days ago with questionable head trauma CT obtained that Given elevated troponin, large clot burden on CT, borderline hypotension, and presyncopal episode decision made to move forward with half dose thrombolytics Family agreeable for plan Systemic heparin Obtain echocardiogram Check lower extremity Dopplers  History of Alzheimer's disease -Home medication regimen includes Aricept, Ativan, Remeron, Seroquel, and Zoloft P: Once medication reconciliation completed by pharmacy resume home regiment Delirium precautions   Best Practice (right click and "Reselect all SmartList Selections" daily)   Diet/type: Regular consistency (see orders) DVT prophylaxis: systemic heparin GI prophylaxis: PPI Lines: N/A Foley:  N/A Code Status:  DNR Last date of multidisciplinary goals of care discussion: Spouse updated at bedside and agrees with half dose tPA.  She again reiterates patient is DNI/DNR and the family would not want aggressive measures if patient were to decline  Labs   CBC: Recent Labs  Lab  01/02/21 1220 01/06/21 1433  WBC 9.5 9.6  NEUTROABS 7.9* 7.7  HGB 14.8 14.1  HCT 44.6 44.1  MCV 86.9 89.6  PLT 260 159    Basic Metabolic Panel: Recent Labs  Lab 01/02/21 1220  01/06/21 1433  NA 144 145  K 4.2 3.9  CL 105 108  CO2 30 28  GLUCOSE 121* 136*  BUN 13 13  CREATININE 0.98 0.93  CALCIUM 9.5 8.7*  MG  --  1.8   GFR: Estimated Creatinine Clearance: 57.3 mL/min (by C-G formula based on SCr of 0.93 mg/dL). Recent Labs  Lab 01/02/21 1220 01/06/21 1433  WBC 9.5 9.6    Liver Function Tests: No results for input(s): AST, ALT, ALKPHOS, BILITOT, PROT, ALBUMIN in the last 168 hours. No results for input(s): LIPASE, AMYLASE in the last 168 hours. No results for input(s): AMMONIA in the last 168 hours.  ABG No results found for: PHART, PCO2ART, PO2ART, HCO3, TCO2, ACIDBASEDEF, O2SAT   Coagulation Profile: No results for input(s): INR, PROTIME in the last 168 hours.  Cardiac Enzymes: No results for input(s): CKTOTAL, CKMB, CKMBINDEX, TROPONINI in the last 168 hours.  HbA1C: No results found for: HGBA1C  CBG: No results for input(s): GLUCAP in the last 168 hours.  Review of Systems:   Please see the history of present illness. All other systems reviewed and are negative   Past Medical History:  He,  has a past medical history of Alzheimer disease (HCC), COVID, Hypercholesteremia, and Hypertension.   Surgical History:   Past Surgical History:  Procedure Laterality Date   KNEE SURGERY     MANDIBLE SURGERY       Social History:   reports that he has never smoked. He has never used smokeless tobacco. He reports current alcohol use. He reports that he does not use drugs.   Family History:  His family history is negative for Diabetes Mellitus II.   Allergies No Known Allergies   Home Medications  Prior to Admission medications   Medication Sig Start Date End Date Taking? Authorizing Provider  donepezil (ARICEPT) 10 MG tablet Take 10 mg by mouth at bedtime.   Yes [provider]  LORazepam (ATIVAN) 1 MG tablet Take 0.5 mg by mouth 2 (two) times daily as needed for anxiety.   Yes [provider]  mirtazapine  (REMERON) 15 MG tablet Take 15 mg by mouth at bedtime.   Yes [provider]  QUEtiapine (SEROQUEL) 25 MG tablet Take 25 mg by mouth daily.   Yes [provider]  sertraline (ZOLOFT) 25 MG tablet Take 25 mg by mouth daily.   Yes [provider]     Critical care time:    Performed by: Juneau Doughman D. Harris  Total critical care time: 45 minutes  Critical care time was exclusive of separately billable procedures and treating other patients.  Critical care was necessary to treat or prevent imminent or life-threatening deterioration.  Critical care was time spent personally by me on the following activities: development of treatment plan with patient and/or surrogate as well as nursing, discussions with consultants, evaluation of patient's response to treatment, examination of patient, obtaining history from patient or surrogate, ordering and performing treatments and interventions, ordering and review of laboratory studies, ordering and review of radiographic studies, pulse oximetry and re-evaluation of patient's condition.  Haruka Kowaleski D. Tiburcio Pea, NP-C [facility] Personal contact information can be found on Amion  01/06/2021, 6:23 PM

## 2021-01-06 NOTE — Progress Notes (Signed)
ANTICOAGULATION CONSULT NOTE - Initial Consult  Pharmacy Consult for heparin Indication: pulmonary embolus  No Known Allergies  Patient Measurements: Height: 5\' 8"  (172.7 cm) Weight: 59 kg (130 lb) IBW/kg (Calculated) : 68.4 Heparin Dosing Weight: TBW  Vital Signs: BP: 101/76 (12/25 1717) Pulse Rate: 95 (12/25 1717)  Labs: Recent Labs    01/06/21 1433  HGB 14.1  HCT 44.1  PLT 159  CREATININE 0.93  TROPONINIHS 496*    Estimated Creatinine Clearance: 57.3 mL/min (by C-G formula based on SCr of 0.93 mg/dL).   Medical History: Past Medical History:  Diagnosis Date   Alzheimer disease (HCC)    COVID    Hypercholesteremia    Hypertension     Assessment: 53 YOM presenting with syncopal events, CT angio with PE, RHS, he is not on anticoagulation PTA, CBC wnl  Goal of Therapy:  Heparin level 0.3-0.7 units/ml Monitor platelets by anticoagulation protocol: Yes   Plan:  Heparin 4000 units IV x 1, and gtt at 1000 units/hr F/u 8 hour heparin level F/u long term [facility] plan  [facility], PharmD Clinical Pharmacist ED Pharmacist Phone # [phone] 01/06/2021 5:40 PM

## 2021-01-06 NOTE — ED Notes (Signed)
Wife at bedside.

## 2021-01-06 NOTE — Progress Notes (Signed)
eLink Physician-Brief Progress Note Patient Name: Jonathan Berry DOB: Jun 06, 1945 MRN: [id number]   Date of Service  01/06/2021  HPI/Events of Note  42 M history of Alzheimer's dementia from memory care unit. Brought in due to syncopal episode, has had falls and decreased PO intake for a few weeks. Previous head CT reportedly negative. CTPA done with large clot burden with evidence of right heart strain and borderline BP. Now on systemic alteplase.  eICU Interventions  Monitor for bleed     Intervention Category Evaluation Type: New Patient Evaluation  Darl Pikes 01/06/2021, 9:25 PM

## 2021-01-06 NOTE — Progress Notes (Signed)
Pt is scratching at abrasion to the nose and pt is now bleeding... Dr. Violet Baldy aware.

## 2021-01-07 ENCOUNTER — Encounter (HOSPITAL_COMMUNITY): Payer: Self-pay | Admitting: Internal Medicine

## 2021-01-07 ENCOUNTER — Inpatient Hospital Stay (HOSPITAL_COMMUNITY): Payer: Medicare Other

## 2021-01-07 DIAGNOSIS — I2609 Other pulmonary embolism with acute cor pulmonale: Secondary | ICD-10-CM

## 2021-01-07 LAB — BASIC METABOLIC PANEL
Anion gap: 11 (ref 5–15)
BUN: 15 mg/dL (ref 8–23)
CO2: 28 mmol/L (ref 22–32)
Calcium: 8.8 mg/dL — ABNORMAL LOW (ref 8.9–10.3)
Chloride: 108 mmol/L (ref 98–111)
Creatinine, Ser: 0.86 mg/dL (ref 0.61–1.24)
GFR, Estimated: >60 mL/min (ref >60)
Glucose, Bld: 100 mg/dL — ABNORMAL HIGH (ref 70–99)
Potassium: 3.4 mmol/L — ABNORMAL LOW (ref 3.5–5.1)
Sodium: 147 mmol/L — ABNORMAL HIGH (ref 135–145)

## 2021-01-07 LAB — ECHOCARDIOGRAM COMPLETE
AR max vel: 1.59 cm2
AV Area VTI: 1.6 cm2
AV Area mean vel: 1.61 cm2
AV Mean grad: 6 mmHg
AV Peak grad: 11.7 mmHg
Ao pk vel: 1.71 m/s
Area-P 1/2: 3.51 cm2
Height: 68 in
S' Lateral: 2.5 cm
Weight: 2271.62 oz

## 2021-01-07 LAB — CBC
HCT: 41.8 % (ref 39.0–52.0)
Hemoglobin: 13.9 g/dL (ref 13.0–17.0)
MCH: 29.3 pg (ref 26.0–34.0)
MCHC: 33.3 g/dL (ref 30.0–36.0)
MCV: 88.2 fL (ref 80.0–100.0)
Platelets: 155 10*3/uL (ref 150–400)
RBC: 4.74 MIL/uL (ref 4.22–5.81)
RDW: 13.4 % (ref 11.5–15.5)
WBC: 8.5 10*3/uL (ref 4.0–10.5)
nRBC: 0 % (ref 0.0–0.2)

## 2021-01-07 LAB — APTT: aPTT: 33 seconds (ref 24–36)

## 2021-01-07 LAB — HEPARIN LEVEL (UNFRACTIONATED)
Heparin Unfractionated: 0.29 IU/mL — ABNORMAL LOW (ref 0.30–0.70)
Heparin Unfractionated: 0.38 IU/mL (ref 0.30–0.70)

## 2021-01-07 LAB — BRAIN NATRIURETIC PEPTIDE: B Natriuretic Peptide: 166.2 pg/mL — ABNORMAL HIGH (ref 0.0–100.0)

## 2021-01-07 LAB — PHOSPHORUS: Phosphorus: 4 mg/dL (ref 2.5–4.6)

## 2021-01-07 LAB — MAGNESIUM: Magnesium: 1.8 mg/dL (ref 1.7–2.4)

## 2021-01-07 MED ORDER — MAGNESIUM SULFATE 2 GM/50ML IV SOLN
2.0000 g | Freq: Once | INTRAVENOUS | Status: DC
  Filled 2021-01-07: qty 50

## 2021-01-07 MED ORDER — POTASSIUM CHLORIDE 20 MEQ PO PACK
40.0000 meq | PACK | Freq: Once | ORAL | Status: AC
  Administered 2021-01-07: 10:00:00 40 meq via ORAL
  Filled 2021-01-07: qty 2

## 2021-01-07 MED ORDER — POTASSIUM CHLORIDE 10 MEQ/100ML IV SOLN: 10.0000 meq | INTRAVENOUS | Status: DC

## 2021-01-07 MED ORDER — POTASSIUM CHLORIDE CRYS ER 20 MEQ PO TBCR
40.0000 meq | EXTENDED_RELEASE_TABLET | Freq: Once | ORAL | Status: AC
  Administered 2021-01-07: 06:00:00 40 meq via ORAL
  Filled 2021-01-07: qty 2

## 2021-01-07 MED ORDER — HEPARIN (PORCINE) 25000 UT/250ML-% IV SOLN
1100.0000 [IU]/h | INTRAVENOUS | Status: DC
  Administered 2021-01-07: 01:00:00 900 [IU]/h via INTRAVENOUS
  Administered 2021-01-08 – 2021-01-10 (×3): 1000 [IU]/h via INTRAVENOUS
  Filled 2021-01-07 (×4): qty 250

## 2021-01-07 NOTE — Progress Notes (Signed)
ANTICOAGULATION CONSULT NOTE   Pharmacy Consult for heparin Indication: pulmonary embolus  No Known Allergies  Patient Measurements: Height: 5\' 8"  (172.7 cm) Weight: 64.4 kg (141 lb 15.6 oz) IBW/kg (Calculated) : 68.4 Heparin Dosing Weight: TBW  Vital Signs: Temp: 97.9 F (36.6 C) (12/26 2045) Temp Source: Axillary (12/26 2045) BP: 126/76 (12/26 2045) Pulse Rate: 63 (12/26 2045)  Labs: Recent Labs    01/06/21 1433 01/06/21 1618 01/06/21 2240 01/07/21 0207 01/07/21 1109 01/07/21 2009  HGB 14.1  --  13.6 13.9  --   --   HCT 44.1  --  42.0 41.8  --   --   PLT 159  --  145* 155  --   --   APTT  --   --  33  --   --   --   LABPROT  --   --  16.1*  --   --   --   INR  --   --  1.3*  --   --   --   HEPARINUNFRC  --   --   --   --  0.29* 0.38  CREATININE 0.93  --   --  0.86  --   --   TROPONINIHS 496* 595*  --   --   --   --     Estimated Creatinine Clearance: 67.6 mL/min (by C-G formula based on SCr of 0.86 mg/dL).   Medical History: Past Medical History:  Diagnosis Date   Alzheimer disease (HCC)    COVID    Hypercholesteremia    Hypertension     Assessment: 36 YOM presenting with syncopal events, CT angio with PE, RHS, he is not on anticoagulation PTA, CBC wnl Now s/p tPA infusion 50mg  and heparin gtt restart at 900 units/hr, first heparin level slightly subtherapeutic at 0.29, no bleeding observed  PM Update: HL after increase now 0.38 on 1000 units/hr. In range. No bleeding noted. Patient on room air.   Goal of Therapy:  Heparin level 0.3-0.7 units/ml Monitor platelets by anticoagulation protocol: Yes   Plan:  Continue heparin gtt to 1000 units/hr Heparin level and CBC in am  61, PharmD, BCPS, BCCCP Clinical Pharmacist Please refer to [facility] for [facility] Pharmacy numbers 01/07/2021 9:13 PM

## 2021-01-07 NOTE — Progress Notes (Signed)
[facility] ADULT ICU REPLACEMENT PROTOCOL   The patient does apply for the [facility] Adult ICU Electrolyte Replacment Protocol based on the criteria listed below:   1.Exclusion criteria: TCTS patients, ECMO patients, and Dialysis patients 2. Is GFR >/= 30 ml/min? Yes.    Patient's GFR today is > 60 3. Is SCr </= 2? Yes.   Patient's SCr is 0.86 mg/dL 4. Did SCr increase >/= 0.5 in 24 hours? No. 5.Pt's weight >40kg  Yes.   6. Abnormal electrolyte(s): K+ 3.4 Mg 1.8  7. Electrolytes replaced per protocol 8.  Call MD STAT for K+ </= 2.5, Phos </= 1, or Mag </= 1 Physician:  Dr Tollie Eth, Jettie Booze 01/07/2021 5:44 AM

## 2021-01-07 NOTE — Progress Notes (Signed)
ANTICOAGULATION CONSULT NOTE   Pharmacy Consult for heparin Indication: pulmonary embolus  No Known Allergies  Patient Measurements: Height: 5\' 8"  (172.7 cm) Weight: 64.4 kg (141 lb 15.6 oz) IBW/kg (Calculated) : 68.4 Heparin Dosing Weight: TBW  Vital Signs: Temp: 98.4 F (36.9 C) (12/26 1100) Temp Source: Oral (12/26 1100) BP: 130/87 (12/26 1000) Pulse Rate: 78 (12/26 1000)  Labs: Recent Labs    01/06/21 1433 01/06/21 1618 01/06/21 2240 01/07/21 0207 01/07/21 1109  HGB 14.1  --  13.6 13.9  --   HCT 44.1  --  42.0 41.8  --   PLT 159  --  145* 155  --   APTT  --   --  33  --   --   LABPROT  --   --  16.1*  --   --   INR  --   --  1.3*  --   --   HEPARINUNFRC  --   --   --   --  0.29*  CREATININE 0.93  --   --  0.86  --   TROPONINIHS 496* 595*  --   --   --      Estimated Creatinine Clearance: 67.6 mL/min (by C-G formula based on SCr of 0.86 mg/dL).   Medical History: Past Medical History:  Diagnosis Date   Alzheimer disease (HCC)    COVID    Hypercholesteremia    Hypertension     Assessment: 12 YOM presenting with syncopal events, CT angio with PE, RHS, he is not on anticoagulation PTA, CBC wnl Now s/p tPA infusion 50mg  and heparin gtt restart at 900 units/hr, first heparin level slightly subtherapeutic at 0.29, no bleeding observed  Goal of Therapy:  Heparin level 0.3-0.7 units/ml Monitor platelets by anticoagulation protocol: Yes   Plan:  Increase heparin gtt to 1000 units/hr F/u 8 hour heparin level  61, PharmD Clinical Pharmacist ED Pharmacist Phone # [phone] 01/07/2021 12:33 PM

## 2021-01-07 NOTE — Progress Notes (Signed)
ANTICOAGULATION CONSULT NOTE - Follow Up Consult  Pharmacy Consult for heparin Indication: pulmonary embolus  Labs: Recent Labs    01/06/21 1433 01/06/21 1618 01/06/21 2240  HGB 14.1  --  13.6  HCT 44.1  --  42.0  PLT 159  --  145*  APTT  --   --  33  LABPROT  --   --  16.1*  INR  --   --  1.3*  CREATININE 0.93  --   --   TROPONINIHS 496* 595*  --     Assessment: 75yo male treated with alteplase for acute PE, now w/ PTT <80 >> to resume heparin infusion.  Goal of Therapy:  Heparin level 0.3-0.5 units/ml x24h after tPA then 0.3-0.7 units/ml   Plan:  Will begin heparin infusion at 900 units/hr and check level in 8 hours.    Vernard Gambles, PharmD, BCPS  01/07/2021,12:44 AM

## 2021-01-07 NOTE — TOC Progression Note (Signed)
Transition of Care [facility]) - Progression Note    Patient Details  Name: Jonathan Berry MRN: [id number] Date of Birth: 11/06/1945  Transition of Care [facility]) CM/SW Contact  Leone Haven, RN Phone Number: 01/07/2021, 4:15 PM  Clinical Narrative:     Transition of Care [facility]) Screening Note   Patient Details  Name: Jonathan Berry Date of Birth: 03-01-1945   Transition of Care [facility]) CM/SW Contact:    Leone Haven, RN Phone Number: 01/07/2021, 4:15 PM    Transition of Care Department [facility]) has reviewed patient and no TOC needs have been identified at this time. We will continue to monitor patient advancement through interdisciplinary progression rounds. If new patient transition needs arise, please place a TOC consult.          Expected Discharge Plan and Services                                                 Social Determinants of Health (SDOH) Interventions    Readmission Risk Interventions No flowsheet data found.

## 2021-01-07 NOTE — Progress Notes (Signed)
NAME:  Jonathan Berry, MRN:  [id number], DOB:  1945/11/06, LOS: 1 ADMISSION DATE:  01/06/2021, CONSULTATION DATE:  01/06/2021 REFERRING MD:  Dr. Renaye Rakers, CHIEF COMPLAINT:  PE with right heart strain    History of Present Illness:  Jonathan Berry is a 75 y.o. male with a PMH significant for alzheimer disease, HLD, and HTN who preseted to the emergency department after syncopal with AMS.  Per family patient had episode of unresponsiveness today during dinner.  Spouse reports patient was sitting in chair when he suddenly became less responsive and then suddenly became pale and his eyes rolled back for a brief period.  On EMS arrival patient was seen hypotensive and hypoxic  Patient was seen hemodynamically stable on ED arrival with borderline low blood pressure.  Given concern for PE CTA chest was obtained and revealed bilateral PEs with evidence of right heart strain (RV/LV 2.4) this coupled with borderline hypotension and elevated troponin critical care was consulted for possible need of intervention to PE.  Pertinent  Medical History  Alzheimer's disease Hypertension Hyperlipidemia  Significant Hospital Events: Including procedures, antibiotic start and stop dates in addition to other pertinent events   12/25 presented to the ED with syncopal episode found to lateral submassive PE with evidence of right heart strain 12/25 7pm given half dose tpa  Interim History / Subjective:  No oevernight issues. HR lower than yesterday, BP higher than yesterday. Breathing comfortably and no adverse bleeding effects.   Objective   Blood pressure 130/87, pulse 78, temperature 98.1 F (36.7 C), temperature source Axillary, resp. rate (!) 22, height 5\' 8"  (1.727 m), weight 64.4 kg, SpO2 100 %.        Intake/Output Summary (Last 24 hours) at 01/07/2021 1022 Last data filed at 01/07/2021 1000 Gross per 24 hour  Intake 308.37 ml  Output --  Net 308.37 ml   Filed Weights   01/06/21 1406 01/07/21 0500   Weight: 59 kg 64.4 kg    Examination:  Gen:      No acute distress HEENT:  EOMI, sclera anicteric Neck:     No masses; no thyromegaly Lungs:    Clear to auscultation bilaterally; normal respiratory effort CV:         Regular rate and rhythm; no murmurs Abd:      + bowel sounds; soft, non-tender; no palpable masses, no distension Ext:    No edema; adequate peripheral perfusion Skin:      Warm and dry; no rash Neuro:   alert, oriented to self, resting tremor   Resolved Hospital Problem list     Assessment & Plan:  Acute submassive pulmonary embolism with acute cor pulmonale, unprovoked - s/p 50 mg systemic TPA.  P: Follow up echocardiogram to evaluate resolution of right heart strain Discussed mandatory 3 months of AC with wife as long as he can tolerate. Will need to evaluate his fall risk to determine if safe to continue further.  Continue heparin gtt for now  History of Alzheimer's disease -continue Home medication regimen includes Aricept, Ativan, Remeron, Seroquel, and Zoloft - Delirium precautions  Will transfer to [facility] with telemetry. TR[facility] to assume care 12/27.   1/28, MD Pulmonary and Critical Care Medicine [facility] 01/07/2021 10:25 AM Pager: see AMION  If no response to pager, please call critical care on call (see AMION) until 7pm After 7:00 pm call Elink      Best Practice (right click and "Reselect all SmartList Selections" daily)   Diet/type: Regular consistency (  see orders) DVT prophylaxis: systemic heparin GI prophylaxis: PPI Lines: N/A Foley:  N/A Code Status:  DNR Last date of multidisciplinary goals of care discussion: Spouse updated at bedside and agrees with half dose tPA.  She again reiterates patient is DNI/DNR and the family would not want aggressive measures if patient were to decline  Labs   CBC: Recent Labs  Lab 01/02/21 1220 01/06/21 1433 01/06/21 2240 01/07/21 0207  WBC 9.5 9.6 8.8 8.5  NEUTROABS 7.9* 7.7  --    --   HGB 14.8 14.1 13.6 13.9  HCT 44.6 44.1 42.0 41.8  MCV 86.9 89.6 88.2 88.2  PLT 260 159 145* 155    Basic Metabolic Panel: Recent Labs  Lab 01/02/21 1220 01/06/21 1433 01/07/21 0207  NA 144 145 147*  K 4.2 3.9 3.4*  CL 105 108 108  CO2 30 28 28   GLUCOSE 121* 136* 100*  BUN 13 13 15   CREATININE 0.98 0.93 0.86  CALCIUM 9.5 8.7* 8.8*  MG  --  1.8 1.8  PHOS  --   --  4.0   GFR: Estimated Creatinine Clearance: 67.6 mL/min (by C-G formula based on SCr of 0.86 mg/dL). Recent Labs  Lab 01/02/21 1220 01/06/21 1433 01/06/21 2240 01/07/21 0207  WBC 9.5 9.6 8.8 8.5  LATICACIDVEN  --   --  1.7  --     Liver Function Tests: No results for input(s): AST, ALT, ALKPHOS, BILITOT, PROT, ALBUMIN in the last 168 hours. No results for input(s): LIPASE, AMYLASE in the last 168 hours. No results for input(s): AMMONIA in the last 168 hours.  ABG No results found for: PHART, PCO2ART, PO2ART, HCO3, TCO2, ACIDBASEDEF, O2SAT   Coagulation Profile: Recent Labs  Lab 01/06/21 2240  INR 1.3*    Cardiac Enzymes: No results for input(s): CKTOTAL, CKMB, CKMBINDEX, TROPONINI in the last 168 hours.  HbA1C: No results found for: HGBA1C  CBG: Recent Labs  Lab 01/06/21 2019  GLUCAP 102*

## 2021-01-08 ENCOUNTER — Other Ambulatory Visit (HOSPITAL_COMMUNITY): Payer: Self-pay

## 2021-01-08 DIAGNOSIS — I2602 Saddle embolus of pulmonary artery with acute cor pulmonale: Secondary | ICD-10-CM

## 2021-01-08 DIAGNOSIS — E43 Unspecified severe protein-calorie malnutrition: Secondary | ICD-10-CM | POA: Insufficient documentation

## 2021-01-08 LAB — CBC
HCT: 37.8 % — ABNORMAL LOW (ref 39.0–52.0)
Hemoglobin: 12.6 g/dL — ABNORMAL LOW (ref 13.0–17.0)
MCH: 29.2 pg (ref 26.0–34.0)
MCHC: 33.3 g/dL (ref 30.0–36.0)
MCV: 87.7 fL (ref 80.0–100.0)
Platelets: 144 10*3/uL — ABNORMAL LOW (ref 150–400)
RBC: 4.31 MIL/uL (ref 4.22–5.81)
RDW: 13.2 % (ref 11.5–15.5)
WBC: 7.3 10*3/uL (ref 4.0–10.5)
nRBC: 0 % (ref 0.0–0.2)

## 2021-01-08 LAB — BASIC METABOLIC PANEL
Anion gap: 8 (ref 5–15)
BUN: 12 mg/dL (ref 8–23)
CO2: 28 mmol/L (ref 22–32)
Calcium: 8.7 mg/dL — ABNORMAL LOW (ref 8.9–10.3)
Chloride: 108 mmol/L (ref 98–111)
Creatinine, Ser: 0.75 mg/dL (ref 0.61–1.24)
GFR, Estimated: >60 mL/min (ref >60)
Glucose, Bld: 90 mg/dL (ref 70–99)
Potassium: 3.9 mmol/L (ref 3.5–5.1)
Sodium: 144 mmol/L (ref 135–145)

## 2021-01-08 LAB — HEPARIN LEVEL (UNFRACTIONATED): Heparin Unfractionated: 0.45 IU/mL (ref 0.30–0.70)

## 2021-01-08 MED ORDER — ADULT MULTIVITAMIN W/MINERALS CH
1.0000 | ORAL_TABLET | Freq: Every day | ORAL | Status: DC
Start: 2021-01-08 — End: 2021-01-10
  Administered 2021-01-08 – 2021-01-10 (×3): 1 via ORAL
  Filled 2021-01-08 (×3): qty 1

## 2021-01-08 MED ORDER — LORAZEPAM 0.5 MG PO TABS: 0.5000 mg | ORAL_TABLET | Freq: Two times a day (BID) | ORAL | Status: DC | PRN

## 2021-01-08 MED ORDER — ENSURE ENLIVE PO LIQD
237.0000 mL | Freq: Three times a day (TID) | ORAL | Status: DC
  Administered 2021-01-08 – 2021-01-10 (×4): 237 mL via ORAL

## 2021-01-08 NOTE — Progress Notes (Signed)
Initial Nutrition Assessment  DOCUMENTATION CODES:   Severe malnutrition in context of chronic illness  INTERVENTION:   Ensure Enlive po TID, each supplement provides 350 kcal and 20 grams of protein.  MVI with minerals daily.  NUTRITION DIAGNOSIS:   Severe Malnutrition related to chronic illness (Alzheimer's disease) as evidenced by severe fat depletion, severe muscle depletion.  GOAL:   Patient will meet greater than or equal to 90% of their needs  MONITOR:   PO intake, Supplement acceptance, Labs, Skin  REASON FOR ASSESSMENT:   Malnutrition Screening Tool    ASSESSMENT:   75 yo male admitted with acute pulmonary embolism with acute cor pulmonale. PMH includes Alzheimer's disease, HLD, HTN.  COVID negative on admission. He was positive in April 2022.  Patient received half dose TPA on admission. Per chart review, patient is DNR and family would not want aggressive measures if he were to decline.   Labs reviewed.  Medications reviewed and include Remeron.  Patient unable to provide any reliable nutrition hx.  Lunch tray at bedside with only bites and sips taken. He states that he likes Ensure supplements "inside."  Weight history reviewed. Patient has lost 14% of usual weight within the past 8 months, not significant for the time frame. He meets criteria for severe malnutrition with severe depletion of muscle and subcutaneous fat mass.   NUTRITION - FOCUSED PHYSICAL EXAM:  Flowsheet Row Most Recent Value  Orbital Region Severe depletion  Upper Arm Region Moderate depletion  Thoracic and Lumbar Region Severe depletion  Buccal Region Severe depletion  Temple Region Severe depletion  Clavicle Bone Region Severe depletion  Clavicle and Acromion Bone Region Severe depletion  Scapular Bone Region Severe depletion  Dorsal Hand Severe depletion  Patellar Region Moderate depletion  Anterior Thigh Region Moderate depletion  Posterior Calf Region Moderate depletion   Edema (RD Assessment) None  Hair Reviewed  Eyes Reviewed  Mouth Reviewed  Skin Reviewed  Nails Reviewed       Diet Order:   Diet Order             Diet regular Room service appropriate? Yes; Fluid consistency: Thin  Diet effective now                   EDUCATION NEEDS:   No education needs have been identified at this time  Skin:  Skin Assessment: Skin Integrity Issues: (jaw and lip lacerations)  Last BM:  no BM documented  Height:   Ht Readings from Last 1 Encounters:  01/06/21 5\' 8"  (1.727 m)    Weight:   Wt Readings from Last 1 Encounters:  01/07/21 64.4 kg    BMI:  Body mass index is 21.59 kg/m.  Estimated Nutritional Needs:   Kcal:  1900-2100  Protein:  90-105 gm  Fluid:  >/= 1.9 L    01/09/21, RD, LDN, CNSC Please refer to Amion for contact information.

## 2021-01-08 NOTE — Progress Notes (Signed)
ANTICOAGULATION CONSULT NOTE   Pharmacy Consult for heparin Indication: pulmonary embolus  No Known Allergies  Patient Measurements: Height: 5\' 8"  (172.7 cm) Weight: 64.4 kg (141 lb 15.6 oz) IBW/kg (Calculated) : 68.4 Heparin Dosing Weight: TBW  Vital Signs: Temp: 98 F (36.7 C) (12/27 0616) Temp Source: Axillary (12/27 0616) BP: 146/68 (12/27 0616) Pulse Rate: 70 (12/27 0616)  Labs: Recent Labs    01/06/21 1433 01/06/21 1618 01/06/21 2240 01/07/21 0207 01/07/21 1109 01/07/21 2009 01/08/21 0311  HGB 14.1  --  13.6 13.9  --   --  12.6*  HCT 44.1  --  42.0 41.8  --   --  37.8*  PLT 159  --  145* 155  --   --  144*  APTT  --   --  33  --   --   --   --   LABPROT  --   --  16.1*  --   --   --   --   INR  --   --  1.3*  --   --   --   --   HEPARINUNFRC  --   --   --   --  0.29* 0.38 0.45  CREATININE 0.93  --   --  0.86  --   --  0.75  TROPONINIHS 496* 595*  --   --   --   --   --      Estimated Creatinine Clearance: 72.7 mL/min (by C-G formula based on SCr of 0.75 mg/dL).   Medical History: Past Medical History:  Diagnosis Date   Alzheimer disease (HCC)    COVID    Hypercholesteremia    Hypertension     Assessment: 38 YOM presenting with syncopal events, CT angio with PE, RHS. He is s/p tPA infusion 50mg  on 12/25 and now on heparin  -heparin level at goal on 1000 units/hr  Goal of Therapy:  Heparin level 0.3-0.7 units/ml Monitor platelets by anticoagulation protocol: Yes   Plan:  Continue heparin gtt to 1000 units/hr Heparin level and CBC in am Will follow oral anticoagulation plans  , PharmD Clinical Pharmacist **Pharmacist phone directory can now be found on [email or web address] ([email or web address]).  Listed under [facility].

## 2021-01-08 NOTE — TOC Initial Note (Signed)
Transition of Care [facility]) - Initial/Assessment Note    Patient Details  Name: Jonathan Berry MRN: [id number] Date of Birth: 1945/10/15  Transition of Care [facility]) CM/SW Contact:    Baldemar Lenis, LCSW Phone Number: 01/08/2021, 3:49 PM  Clinical Narrative:       CSW alerted by RN that [facility] requesting PT eval information. CSW faxed PT eval over, left a voicemail for Dow Adolph to call back to discuss. CSW to follow for patient return to memory care.            Expected Discharge Plan: Memory Care Barriers to Discharge: Continued Medical Work up   Patient Goals and CMS Choice Patient states their goals for this hospitalization and ongoing recovery are:: patient unable to participate in goal setting, not fully oriented CMS Medicare.gov Compare Post Acute Care list provided to:: Patient Represenative (must comment) Choice offered to / list presented to : Spouse  Expected Discharge Plan and Services Expected Discharge Plan: Memory Care     Post Acute Care Choice: Home Health Living arrangements for the past 2 months: Assisted Living Facility                                      Prior Living Arrangements/Services Living arrangements for the past 2 months: Assisted Living Facility Lives with:: Facility Resident Patient language and need for interpreter reviewed:: No Do you feel safe going back to the place where you live?: Yes      Need for Family Participation in Patient Care: Yes (Comment) Care giver support system in place?: Yes (comment)   Criminal Activity/Legal Involvement Pertinent to Current Situation/Hospitalization: No - Comment as needed  Activities of Daily Living Home Assistive Devices/Equipment: None ADL Screening (condition at time of admission) Patient's cognitive ability adequate to safely complete daily activities?: No Is the patient deaf or have difficulty hearing?: No Does the patient have difficulty seeing, even when wearing glasses/contacts?:  No Does the patient have difficulty concentrating, remembering, or making decisions?: Yes Patient able to express need for assistance with ADLs?: No Does the patient have difficulty dressing or bathing?: Yes Independently performs ADLs?: No Communication: Needs assistance Is this a change from baseline?: Pre-admission baseline Dressing (OT): Needs assistance Is this a change from baseline?: Pre-admission baseline Grooming: Needs assistance Is this a change from baseline?: Pre-admission baseline Feeding: Dependent Is this a change from baseline?: Pre-admission baseline Bathing: Dependent Is this a change from baseline?: Pre-admission baseline Toileting: Dependent Is this a change from baseline?: Pre-admission baseline In/Out Bed: Needs assistance Is this a change from baseline?: Pre-admission baseline Walks in Home: Needs assistance Is this a change from baseline?: Pre-admission baseline Does the patient have difficulty walking or climbing stairs?: Yes Weakness of Legs: Both Weakness of Arms/Hands: Both  Permission Sought/Granted Permission sought to share information with : Facility Medical sales representative, Family Supports Permission granted to share information with : Yes, Verbal Permission Granted  Share Information with NAME: [place]  Permission granted to share info w AGENCY: [facility]  Permission granted to share info w Relationship: Spouse     Emotional Assessment   Attitude/Demeanor/Rapport: Unable to Assess Affect (typically observed): Unable to Assess Orientation: : Oriented to Self Alcohol / Substance Use: Not Applicable Psych Involvement: No (comment)  Admission diagnosis:  Acute pulmonary embolism with acute cor pulmonale (HCC) [I26.09] Patient Active Problem List   Diagnosis Date Noted   Protein-calorie malnutrition, severe 01/08/2021  Acute pulmonary embolism with acute cor pulmonale (HCC) 01/06/2021   Pneumonia due to COVID-19 virus 04/25/2020   ARF  (acute renal failure) (HCC) 04/25/2020   DNR (do not resuscitate) 04/25/2020   Essential hypertension 04/25/2020   PCP:  Pcp, No Pharmacy:  No Pharmacies Listed    Social Determinants of Health (SDOH) Interventions    Readmission Risk Interventions No flowsheet data found.

## 2021-01-08 NOTE — Progress Notes (Signed)
PROGRESS NOTE  Jonathan Berry  IHK:[id number] DOB: August 19, 1945 DOA: 01/06/2021 PCP: Oneita HurtPcp, No   Brief Narrative: Jonathan QuanCarl Berry is a 75 y.o. male with a history of Alzheimer's dementia, more frequent falls of late, HTN, HLD who presented to the ED from memory care with a syncopal event on 12/25. He became unresponsive and pale abruptly while being fed lunch, was found to be hypotensive and hypoxic with CTA chest revealing bilateral PEs with right heart strain (RV/LV 2.4). Pt was hypotensive with elevated troponin, so he was admitted to the ICU and received, after goals of care risk/benefit discussions with his spouse, half dose of systemic tPA in addition to heparin infusion. The following day he was transferred out of the ICU and continues on heparin infusion with improved hypoxemia and blood pressure.   Assessment & Plan: Principal Problem:   Acute pulmonary embolism with acute cor pulmonale (HCC) Active Problems:   Protein-calorie malnutrition, severe  Acute submassive bilateral pulmonary emboli with acute cor pulmonale: s/p systemic tPA 50mg  12/25. Follow up echocardiogram revealed +McConnell's sign with normal RV size and mildly reduced systolic function.  - Continue anticoagulation for 3-6 months. No known provocative factors.  - Continue IV heparin 48-72 hours, anticipate conversion to DOAC 12/28 AM.  - PT/OT consulted now that bed rest no longer required. Pt's recent falls makes ongoing anticoagulation high risk. D/w spouse.   Alzheimer's dementia with intermittent agitation: Diagnosed 3.7155yrs ago - Continue donepezil, seroquel, SSRI, prn ativan - Continue delirium precautions  Poor per oral intake, severe protein-calorie malnutrition:  - Ok to continue remeron qHS as well as supplemental protein  Previous fall with lip and chin lacerations: Appear well-healed s/p suturing 12/21.  - Ok to go ahead and remove sutures.   DNR: POA  DVT prophylaxis: IV heparin Code Status: DNR Family  Communication: Spouse at bedside Disposition Plan:  Status is: Inpatient  Remains inpatient appropriate because: Continue IV heparin x48-72 hours, monitor for bleeding s/p systemic tPA, evaluate/mitigate fall risk. Anticipate DC 12/28 to either memory care or SNF.  Consultants:  PCCM  Procedures:  Systemic tPA 50mg  x1 12/25  Antimicrobials: None   Subjective: Pt not conversant, no overnight events. Spouse at bedside states he appears at his baseline. Denies dyspnea or chest pain.   Objective: Vitals:   01/07/21 1549 01/07/21 2045 01/08/21 0616 01/08/21 1126  BP: 132/78 126/76 (!) 146/68 (!) 94/58  Pulse: 63 63 70 82  Resp: 12 14 18 17   Temp: 98.6 F (37 C) 97.9 F (36.6 C) 98 F (36.7 C) 97.8 F (36.6 C)  TempSrc: Axillary Axillary Axillary Oral  SpO2: 100% 98%    Weight:      Height:        Intake/Output Summary (Last 24 hours) at 01/08/2021 1417 Last data filed at 01/08/2021 0400 Gross per 24 hour  Intake 764.81 ml  Output --  Net 764.81 ml   Filed Weights   01/06/21 1406 01/07/21 0500  Weight: 59 kg 64.4 kg   Gen: Frail, thin male in no distress Pulm: Non-labored breathing at rest. Clear to auscultation bilaterally.  CV: Regular rate and rhythm. No murmur, rub, or gallop. No JVD, no pitting pedal edema. GI: Abdomen soft, non-tender, non-distended, with normoactive bowel sounds. No organomegaly or masses felt. Ext: Warm, no deformities Skin: No rashes, lesions or ulcers on visualized skin Neuro: Rouses but falls back to sleep with conversation this morning. Not oriented, no focal neurological deficits on limited exam Psych: Judgement and insight appear impaired.  Data Reviewed: I have personally reviewed following labs and imaging studies  CBC: Recent Labs  Lab 01/02/21 1220 01/06/21 1433 01/06/21 2240 01/07/21 0207 01/08/21 0311  WBC 9.5 9.6 8.8 8.5 7.3  NEUTROABS 7.9* 7.7  --   --   --   HGB 14.8 14.1 13.6 13.9 12.6*  HCT 44.6 44.1 42.0 41.8  37.8*  MCV 86.9 89.6 88.2 88.2 87.7  PLT 260 159 145* 155 [id number]*   Basic Metabolic Panel: Recent Labs  Lab 01/02/21 1220 01/06/21 1433 01/07/21 0207 01/08/21 0311  NA 144 145 147* 144  K 4.2 3.9 3.4* 3.9  CL 105 108 108 108  CO2 30 28 28 28   GLUCOSE 121* 136* 100* 90  BUN 13 13 15 12   CREATININE 0.98 0.93 0.86 0.75  CALCIUM 9.5 8.7* 8.8* 8.7*  MG  --  1.8 1.8  --   PHOS  --   --  4.0  --    GFR: Estimated Creatinine Clearance: 72.7 mL/min (by C-G formula based on SCr of 0.75 mg/dL). Liver Function Tests: No results for input(s): AST, ALT, ALKPHOS, BILITOT, PROT, ALBUMIN in the last 168 hours. No results for input(s): LIPASE, AMYLASE in the last 168 hours. No results for input(s): AMMONIA in the last 168 hours. Coagulation Profile: Recent Labs  Lab 01/06/21 2240  INR 1.3*   Cardiac Enzymes: No results for input(s): CKTOTAL, CKMB, CKMBINDEX, TROPONINI in the last 168 hours. BNP (last 3 results) No results for input(s): PROBNP in the last 8760 hours. HbA1C: No results for input(s): HGBA1C in the last 72 hours. CBG: Recent Labs  Lab 01/06/21 2019  GLUCAP 102*   Lipid Profile: No results for input(s): CHOL, HDL, LDLCALC, TRIG, CHOLHDL, LDLDIRECT in the last 72 hours. Thyroid Function Tests: No results for input(s): TSH, T4TOTAL, FREET4, T3FREE, THYROIDAB in the last 72 hours. Anemia Panel: No results for input(s): VITAMINB12, FOLATE, FERRITIN, TIBC, IRON, RETICCTPCT in the last 72 hours. Urine analysis:    Component Value Date/Time   COLORURINE YELLOW 01/02/2021 1220   APPEARANCEUR HAZY (A) 01/02/2021 1220   LABSPEC 1.023 01/02/2021 1220   PHURINE 5.0 01/02/2021 1220   GLUCOSEU NEGATIVE 01/02/2021 1220   HGBUR NEGATIVE 01/02/2021 1220   BILIRUBINUR NEGATIVE 01/02/2021 1220   KETONESUR 20 (A) 01/02/2021 1220   PROTEINUR 30 (A) 01/02/2021 1220   NITRITE NEGATIVE 01/02/2021 1220   LEUKOCYTESUR NEGATIVE 01/02/2021 1220   Recent Results (from the past 240  hour(s))  Resp Panel by RT-PCR (Flu A&B, Covid) Nasopharyngeal Swab     Status: None   Collection Time: 01/06/21  2:11 PM   Specimen: Nasopharyngeal Swab; Nasopharyngeal(NP) swabs in vial transport medium  Result Value Ref Range Status   SARS Coronavirus 2 by RT PCR NEGATIVE NEGATIVE Final    Comment: (NOTE) SARS-CoV-2 target nucleic acids are NOT DETECTED.  The SARS-CoV-2 RNA is generally detectable in upper respiratory specimens during the acute phase of infection. The lowest concentration of SARS-CoV-2 viral copies this assay can detect is 138 copies/mL. A negative result does not preclude SARS-Cov-2 infection and should not be used as the sole basis for treatment or other patient management decisions. A negative result may occur with  improper specimen collection/handling, submission of specimen other than nasopharyngeal swab, presence of viral mutation(s) within the areas targeted by this assay, and inadequate number of viral copies(<138 copies/mL). A negative result must be combined with clinical observations, patient history, and epidemiological information. The expected result is Negative.  Fact Sheet for Patients:  BloggerCourse.com  Fact Sheet for Healthcare Providers:  SeriousBroker.it  This test is no t yet approved or cleared by the [place] FDA and  has been authorized for detection and/or diagnosis of SARS-CoV-2 by FDA under an Emergency Use Authorization (EUA). This EUA will remain  in effect (meaning this test can be used) for the duration of the COVID-19 declaration under Section 564(b)(1) of the Act, 21 U.S.C.section 360bbb-3(b)(1), unless the authorization is terminated  or revoked sooner.       Influenza A by PCR NEGATIVE NEGATIVE Final   Influenza B by PCR NEGATIVE NEGATIVE Final    Comment: (NOTE) The Xpert Xpress SARS-CoV-2/FLU/RSV plus assay is intended as an aid in the diagnosis of influenza from  Nasopharyngeal swab specimens and should not be used as a sole basis for treatment. Nasal washings and aspirates are unacceptable for Xpert Xpress SARS-CoV-2/FLU/RSV testing.  Fact Sheet for Patients: BloggerCourse.com  Fact Sheet for Healthcare Providers: SeriousBroker.it  This test is not yet approved or cleared by the [place] FDA and has been authorized for detection and/or diagnosis of SARS-CoV-2 by FDA under an Emergency Use Authorization (EUA). This EUA will remain in effect (meaning this test can be used) for the duration of the COVID-19 declaration under Section 564(b)(1) of the Act, 21 U.S.C. section 360bbb-3(b)(1), unless the authorization is terminated or revoked.  Performed at [facility], [address], [place], [place] [address]   MRSA Next Gen by PCR, Nasal     Status: None   Collection Time: 01/06/21  8:36 PM   Specimen: Nasal Mucosa; Nasal Swab  Result Value Ref Range Status   MRSA by PCR Next Gen NOT DETECTED NOT DETECTED Final    Comment: (NOTE) The GeneXpert MRSA Assay (FDA approved for NASAL specimens only), is one component of a comprehensive MRSA colonization surveillance program. It is not intended to diagnose MRSA infection nor to guide or monitor treatment for MRSA infections. Test performance is not FDA approved in patients less than 22 years old. Performed at [facility], [address] [place], [place] [address]       Radiology Studies: CT Angio Chest PE W and/or Wo Contrast  Result Date: 01/06/2021 CLINICAL DATA:  Loss of consciousness with 2 syncopal episodes. Concern for pulmonary embolus EXAM: CT ANGIOGRAPHY CHEST WITH CONTRAST TECHNIQUE: Multidetector CT imaging of the chest was performed using the standard protocol during bolus administration of intravenous contrast. Multiplanar CT image reconstructions and MIPs were obtained to evaluate the vascular anatomy. CONTRAST:   25mL OMNIPAQUE IOHEXOL 350 MG/ML SOLN COMPARISON:  Chest radiograph May 08, 2020 FINDINGS: Cardiovascular: Pulmonary emboli in the bilateral lobar pulmonary arteries with extension into segmental and subsegmental branches. The right ventricular to left ventricular ratio is 2.4. Normal size heart. No significant pericardial effusion/thickening. Aortic atherosclerosis without aneurysmal dilation. Mediastinum/Nodes: No discrete thyroid nodule. No pathologically enlarged mediastinal, hilar or axillary lymph nodes. The trachea and esophagus are grossly unremarkable. Lungs/Pleura: Biapical pleuroparenchymal scarring. No suspicious pulmonary nodules or masses. No focal airspace consolidation. No pleural effusion. No pneumothorax. Upper Abdomen: No acute abnormality. Musculoskeletal: Multilevel degenerative changes spine. No acute osseous abnormality. Review of the MIP images confirms the above findings. IMPRESSION: 1. Positive for acute PE with CT evidence of right heart strain (RV/LV Ratio = 2.4) consistent with at least submassive (intermediate risk) PE. The presence of right heart strain has been associated with an increased risk of morbidity and mortality. 2. Aortic Atherosclerosis (ICD10-I70.0). These results were called  by telephone at the time of interpretation on 01/06/2021 at 5:18 pm to provider PA Winfred Burn, who verbally acknowledged these results. Electronically Signed   By: Dahlia Bailiff M.D.   On: 01/06/2021 17:22   DG Chest Portable 1 View  Result Date: 01/06/2021 CLINICAL DATA:  evaluate for aspiration EXAM: PORTABLE CHEST 1 VIEW COMPARISON:  May 08, 2020 FINDINGS: The cardiomediastinal silhouette is unchanged in contour. No pleural effusion. No pneumothorax. No acute pleuroparenchymal abnormality. Visualized abdomen is unremarkable. Multilevel degenerative changes of the thoracic spine. IMPRESSION: No acute cardiopulmonary abnormality. Electronically Signed   By: Valentino Saxon M.D.   On:  01/06/2021 15:00   ECHOCARDIOGRAM COMPLETE  Result Date: 01/07/2021    ECHOCARDIOGRAM REPORT   Patient Name:   GERAMY FICKLE Date of Exam: 01/07/2021 Medical Rec #:  [id number]   Height:       68.0 in Accession #:    [id number]  Weight:       142.0 lb Date of Birth:  Jun 03, 1945   BSA:          1.767 m Patient Age:    62 years    BP:           109/65 mmHg Patient Gender: M           HR:           75 bpm. Exam Location:  Inpatient Procedure: 2D Echo, Cardiac Doppler and Color Doppler Indications:    Pulmonary embolus  History:        Patient has no prior history of Echocardiogram examinations.                 Risk Factors:Hypertension.  Sonographer:    Glo Herring Referring Phys: [id number] [place]  Sonographer Comments: Suboptimal apical window. IMPRESSIONS  1. Left ventricular ejection fraction, by estimation, is 60 to 65%. The left ventricle has normal function. The left ventricle has no regional wall motion abnormalities. Left ventricular diastolic parameters are consistent with Grade I diastolic dysfunction (impaired relaxation).  2. Severe hypokineisis of the RV free wall with preserved apical contractility (McConnell's sign) is present. Right ventricular systolic function is mildly reduced. The right ventricular size is normal.  3. The mitral valve is normal in structure. Mild mitral valve regurgitation. No evidence of mitral stenosis.  4. The aortic valve is tricuspid. There is mild calcification of the aortic valve. There is mild thickening of the aortic valve. Aortic valve regurgitation is trivial. Aortic valve sclerosis/calcification is present, without any evidence of aortic stenosis.  5. The inferior vena cava is normal in size with greater than 50% respiratory variability, suggesting right atrial pressure of 3 mmHg. FINDINGS  Left Ventricle: Left ventricular ejection fraction, by estimation, is 60 to 65%. The left ventricle has normal function. The left ventricle has no regional wall motion  abnormalities. The left ventricular internal cavity size was normal in size. There is  no left ventricular hypertrophy. Left ventricular diastolic parameters are consistent with Grade I diastolic dysfunction (impaired relaxation). Right Ventricle: Severe hypokineisis of the RV free wall with preserved apical contractility (McConnell's sign) is present. The right ventricular size is normal. No increase in right ventricular wall thickness. Right ventricular systolic function is mildly reduced. Left Atrium: Left atrial size was normal in size. Right Atrium: Right atrial size was normal in size. Pericardium: There is no evidence of pericardial effusion. Mitral Valve: The mitral valve is normal in structure. Mild mitral annular calcification. Mild mitral valve regurgitation. No  evidence of mitral valve stenosis. Tricuspid Valve: The tricuspid valve is normal in structure. Tricuspid valve regurgitation is trivial. No evidence of tricuspid stenosis. Aortic Valve: The aortic valve is tricuspid. There is mild calcification of the aortic valve. There is mild thickening of the aortic valve. Aortic valve regurgitation is trivial. Aortic valve sclerosis/calcification is present, without any evidence of aortic stenosis. Aortic valve mean gradient measures 6.0 mmHg. Aortic valve peak gradient measures 11.7 mmHg. Aortic valve area, by VTI measures 1.60 cm. Pulmonic Valve: The pulmonic valve was normal in structure. Pulmonic valve regurgitation is mild. No evidence of pulmonic stenosis. Aorta: The aortic root and ascending aorta are structurally normal, with no evidence of dilitation. Venous: The inferior vena cava is normal in size with greater than 50% respiratory variability, suggesting right atrial pressure of 3 mmHg. IAS/Shunts: No atrial level shunt detected by color flow Doppler.  LEFT VENTRICLE PLAX 2D LVIDd:         4.10 cm   Diastology LVIDs:         2.50 cm   LV e' medial:    5.44 cm/s LV PW:         1.10 cm   LV E/e'  medial:  10.4 LV IVS:        1.10 cm   LV e' lateral:   6.96 cm/s LVOT diam:     2.10 cm   LV E/e' lateral: 8.1 LV SV:         52 LV SV Index:   29 LVOT Area:     3.46 cm  RIGHT VENTRICLE             IVC RV S prime:     14.10 cm/s  IVC diam: 2.10 cm LEFT ATRIUM         Index LA diam:    2.90 cm 1.64 cm/m  AORTIC VALVE                     PULMONIC VALVE AV Area (Vmax):    1.59 cm      PV Vmax:       0.74 m/s AV Area (Vmean):   1.61 cm      PV Peak grad:  2.2 mmHg AV Area (VTI):     1.60 cm AV Vmax:           171.00 cm/s AV Vmean:          111.000 cm/s AV VTI:            0.324 m AV Peak Grad:      11.7 mmHg AV Mean Grad:      6.0 mmHg LVOT Vmax:         78.30 cm/s LVOT Vmean:        51.700 cm/s LVOT VTI:          0.150 m LVOT/AV VTI ratio: 0.46  AORTA Ao Root diam: 3.80 cm Ao Asc diam:  3.30 cm MITRAL VALVE               TRICUSPID VALVE MV Area (PHT): 3.51 cm    TR Peak grad:   15.1 mmHg MV Decel Time: 216 msec    TR Vmax:        194.00 cm/s MV E velocity: 56.60 cm/s MV A velocity: 72.00 cm/s  SHUNTS MV E/A ratio:  0.79        Systemic VTI:  0.15 m  Systemic Diam: 2.10 cm Mihai Croitoru MD Electronically signed by Sanda Klein MD Signature Date/Time: 01/07/2021/1:07:03 PM    Final     Scheduled Meds:  donepezil  10 mg Oral QHS   feeding supplement  237 mL Oral TID BM   mirtazapine  15 mg Oral QHS   multivitamin with minerals  1 tablet Oral Daily   QUEtiapine  25 mg Oral Daily   sertraline  25 mg Oral Daily   Continuous Infusions:  heparin 1,000 Units/hr (01/08/21 0128)   magnesium sulfate bolus IVPB 50 mL/hr at 01/07/21 1528     LOS: 2 days   Time spent: 35 minutes.  Patrecia Pour, MD [facility] [email or web address] 01/08/2021, 2:17 PM

## 2021-01-08 NOTE — Evaluation (Signed)
Physical Therapy Evaluation Patient Details Name: Jonathan Berry MRN: [id number] DOB: Jan 01, 1946 Today's Date: 01/08/2021  History of Present Illness  Pt is a 75 y.o. M who presents 01/06/2021 with syncopal episode; found to have lateral submassive PE with evidence of right heart strain. Given half dose tpa. Significant PMH: Alzheimer disease, HLD, HTN.  Clinical Impression  Pt admitted with above. Prior to admission, pt living at [facility] in memory care unit, requires assist for ADL's and is ambulatory. Pt wife reports pt more sedentary in past 1.5 weeks with several falls. Pt overall requiring min assist for functional mobility. Ambulating room distances with handheld assist for guidance. He is resistant to ambulating into restroom or further distances, however, was able to be redirected to sit in recliner. SpO2 92-95% on RA, HR 77-90's. Suspect will benefit from returning to familiar environment.     Recommendations for follow up therapy are one component of a multi-disciplinary discharge planning process, led by the attending physician.  Recommendations may be updated based on patient status, additional functional criteria and insurance authorization.  Follow Up Recommendations Home health PT (at [facility])    Assistance Recommended at Discharge Frequent or constant Supervision/Assistance  Functional Status Assessment Patient has had a recent decline in their functional status and demonstrates the ability to make significant improvements in function in a reasonable and predictable amount of time.  Equipment Recommendations  None recommended by PT    Recommendations for Other Services       Precautions / Restrictions Precautions Precautions: Fall Restrictions Weight Bearing Restrictions: No      Mobility  Bed Mobility Overal bed mobility: Needs Assistance Bed Mobility: Supine to Sit     Supine to sit: Min assist     General bed mobility comments: assist for  initiation    Transfers Overall transfer level: Needs assistance Equipment used: None Transfers: Sit to/from Stand Sit to Stand: Min guard                Ambulation/Gait Ambulation/Gait assistance: Min Chemical engineer (Feet): 20 Feet Assistive device: 1 person hand held assist Gait Pattern/deviations: Step-through pattern;Decreased stride length Gait velocity: decreased Gait velocity interpretation: <1.8 ft/sec, indicate of risk for recurrent falls   General Gait Details: Pt requiring HHA for environmental guidance, needs redirection and visual cues for targets  Stairs            Wheelchair Mobility    Modified Rankin (Stroke Patients Only)       Balance Overall balance assessment: Needs assistance Sitting-balance support: Feet supported Sitting balance-Leahy Scale: Fair     Standing balance support: Single extremity supported;During functional activity Standing balance-Leahy Scale: Fair                               Pertinent Vitals/Pain Pain Assessment: PAINAD Breathing: normal Negative Vocalization: none Facial Expression: sad, frightened, frown Body Language: tense, distressed pacing, fidgeting Consolability: no need to console PAINAD Score: 2    Home Living Family/patient expects to be discharged to:: Assisted living                   Additional Comments: Memory care at [facility], 2 weeks    Prior Function Prior Level of Function : Needs assist             Mobility Comments: recent falls in past 1.5 weeks, does not typically use AD ADLs Comments: assist for ADL's, can be  combative with bathing     Hand Dominance   Dominant Hand: Right    Extremity/Trunk Assessment   Upper Extremity Assessment Upper Extremity Assessment: Generalized weakness    Lower Extremity Assessment Lower Extremity Assessment: Generalized weakness    Cervical / Trunk Assessment Cervical / Trunk Assessment: Kyphotic   Communication   Communication: No difficulties  Cognition Arousal/Alertness: Awake/alert Behavior During Therapy: Flat affect Overall Cognitive Status: History of cognitive impairments - at baseline                                 General Comments: History of Alzhemier's, pt following intermittent verbal and tactile cues, communicative        General Comments      Exercises     Assessment/Plan    PT Assessment Patient needs continued PT services  PT Problem List Decreased strength;Decreased activity tolerance;Decreased balance;Decreased mobility;Decreased cognition;Decreased safety awareness       PT Treatment Interventions DME instruction;Gait training;Functional mobility training;Therapeutic exercise;Therapeutic activities;Balance training;Patient/family education    PT Goals (Current goals can be found in the Care Plan section)  Acute Rehab PT Goals Patient Stated Goal: pt wife would like him to return to memory care unit PT Goal Formulation: With patient Time For Goal Achievement: 01/22/21 Potential to Achieve Goals: Fair    Frequency Min 3X/week   Barriers to discharge        Co-evaluation               AM-PAC PT "6 Clicks" Mobility  Outcome Measure Help needed turning from your back to your side while in a flat bed without using bedrails?: A Little Help needed moving from lying on your back to sitting on the side of a flat bed without using bedrails?: A Little Help needed moving to and from a bed to a chair (including a wheelchair)?: A Little Help needed standing up from a chair using your arms (e.g., wheelchair or bedside chair)?: A Little Help needed to walk in hospital room?: A Little Help needed climbing 3-5 steps with a railing? : A Lot 6 Click Score: 17    End of Session Equipment Utilized During Treatment: Gait belt Activity Tolerance: Patient tolerated treatment well Patient left: in chair;with call bell/phone within reach;with  chair alarm set;with family/visitor present Nurse Communication: Mobility status PT Visit Diagnosis: Unsteadiness on feet (R26.81);History of falling (Z91.81);Difficulty in walking, not elsewhere classified (R26.2)    Time: 1601-0932 PT Time Calculation (min) (ACUTE ONLY): 36 min   Charges:   PT Evaluation $PT Eval Moderate Complexity: 1 Mod PT Treatments $Therapeutic Activity: 8-22 mins        Lillia Pauls, PT, DPT Acute Rehabilitation Services Pager [phone] Office [phone]   GUENTHER DUNSHEE 01/08/2021, 1:27 PM

## 2021-01-08 NOTE — TOC Benefit Eligibility Note (Signed)
Patient Advocate Encounter  Insurance verification completed.    The patient is currently admitted and upon discharge could be taking Eliquis 5 mg.  The current 30 day co-pay is, $47.00.   The patient is currently admitted and upon discharge could be taking Xarelto 20 mg.  The current 30 day co-pay is, $47.00.   The patient is insured through [employer] Medicare Part D     Daleena Rotter, CPhT Pharmacy Patient Advocate Specialist [facility] Patient Advocate Team Direct Number: [phone]  Fax: [phone]        

## 2021-01-09 DIAGNOSIS — I2609 Other pulmonary embolism with acute cor pulmonale: Secondary | ICD-10-CM | POA: Diagnosis not present

## 2021-01-09 DIAGNOSIS — E43 Unspecified severe protein-calorie malnutrition: Secondary | ICD-10-CM | POA: Diagnosis not present

## 2021-01-09 LAB — CBC
HCT: 36 % — ABNORMAL LOW (ref 39.0–52.0)
Hemoglobin: 12.1 g/dL — ABNORMAL LOW (ref 13.0–17.0)
MCH: 28.9 pg (ref 26.0–34.0)
MCHC: 33.6 g/dL (ref 30.0–36.0)
MCV: 86.1 fL (ref 80.0–100.0)
Platelets: 158 10*3/uL (ref 150–400)
RBC: 4.18 MIL/uL — ABNORMAL LOW (ref 4.22–5.81)
RDW: 12.8 % (ref 11.5–15.5)
WBC: 7.8 10*3/uL (ref 4.0–10.5)
nRBC: 0 % (ref 0.0–0.2)

## 2021-01-09 LAB — HEPARIN LEVEL (UNFRACTIONATED): Heparin Unfractionated: 0.39 IU/mL (ref 0.30–0.70)

## 2021-01-09 LAB — BASIC METABOLIC PANEL
Anion gap: 11 (ref 5–15)
BUN: 10 mg/dL (ref 8–23)
CO2: 24 mmol/L (ref 22–32)
Calcium: 8.5 mg/dL — ABNORMAL LOW (ref 8.9–10.3)
Chloride: 103 mmol/L (ref 98–111)
Creatinine, Ser: 0.69 mg/dL (ref 0.61–1.24)
GFR, Estimated: >60 mL/min (ref >60)
Glucose, Bld: 90 mg/dL (ref 70–99)
Potassium: 3.9 mmol/L (ref 3.5–5.1)
Sodium: 138 mmol/L (ref 135–145)

## 2021-01-09 NOTE — Progress Notes (Signed)
[facility] PROGRESS NOTE    Progress Note  [name]  HQR:[id number] DOB: Feb 25, 1945 DOA: 01/06/2021 PCP: Oneita Hurt, No     Brief Narrative:   Jonathan Berry is an 75 y.o. male past medical history of Alzheimer's dementia frequent falls, essential hypertension who was transferred from a memory care unit with a syncopal event that happened on this day, became unresponsive while being fed lunch, in the ED was found hypotensive hypoxic CTA revealed bilateral PE with right heart strain    Assessment/Plan:   Acute submassive pulmonary embolism with acute cor pulmonale (HCC) Status post tPA on 12/25, no inciting factors. We will continue IV heparin for total of 72 hours. Anticipate change to DOAC tomorrow. Physical therapy has been consulted.  Alzheimer's dementia: Diagnosed about 4 years ago continue benazepril, Seroquel, SSRI and Ativan as needed. At high risk for aspiration  Poor oral intake severe protein-calorie malnutrition,  At high risk of acute confusional state continue nightly medications.    DVT prophylaxis: heparin Family Communication:wife Status is: Inpatient  Remains inpatient appropriate because: Acute bilateral saddle PEs        Code Status:     Code Status Orders  (From admission, onward)           Start     Ordered   01/06/21 1810  Do not attempt resuscitation (DNR)  Continuous       Question Answer Comment  In the event of cardiac or respiratory ARREST Do not call a code blue   In the event of cardiac or respiratory ARREST Do not perform Intubation, CPR, defibrillation or ACLS   In the event of cardiac or respiratory ARREST Use medication by any route, position, wound care, and other measures to relive pain and suffering. May use oxygen, suction and manual treatment of airway obstruction as needed for comfort.      01/06/21 1811           Code Status History     Date Active Date Inactive Code Status Order ID Comments User Context    01/06/2021 1737 01/06/2021 1811 DNR [id number]  Terald Sleeper, MD ED   04/25/2020 2326 04/29/2020 2013 DNR [id number]  Eduard Clos, MD ED      Advance Directive Documentation    Flowsheet Row Most Recent Value  Type of Advance Directive Living will  Pre-existing out of facility DNR order (yellow form or pink MOST form) --  "MOST" Form in Place? --         IV Access:   Peripheral IV   Procedures and diagnostic studies:   No results found.   Medical Consultants:   None.   Subjective:    [name] relates his breathing is improved.  Objective:    Vitals:   01/08/21 0616 01/08/21 1126 01/08/21 2012 01/09/21 0446  BP: (!) 146/68 (!) 94/58 124/69 133/67  Pulse: 70 82 65 66  Resp: 18 17 15 18   Temp: 98 F (36.7 C) 97.8 F (36.6 C) 98 F (36.7 C) 97.7 F (36.5 C)  TempSrc: Axillary Oral Oral Axillary  SpO2:   98% 99%  Weight:    59.9 kg  Height:       SpO2: 99 %   Intake/Output Summary (Last 24 hours) at 01/09/2021 1136 Last data filed at 01/08/2021 1515 Gross per 24 hour  Intake 675.01 ml  Output --  Net 675.01 ml   Filed Weights   01/06/21 1406 01/07/21 0500 01/09/21 0446  Weight: 59 kg 64.4  kg 59.9 kg    Exam: General exam: In no acute distress. Respiratory system: Good air movement and clear to auscultation. Cardiovascular system: S1 & S2 heard, RRR. No JVD. Gastrointestinal system: Abdomen is nondistended, soft and nontender.  Extremities: No pedal edema. Skin: No rashes, lesions or ulcers Psychiatry: Judgement and insight appear normal. Mood & affect appropriate.    Data Reviewed:    Labs: Basic Metabolic Panel: Recent Labs  Lab 01/02/21 1220 01/06/21 1433 01/07/21 0207 01/08/21 0311 01/09/21 0430  NA 144 145 147* 144 138  K 4.2 3.9 3.4* 3.9 3.9  CL 105 108 108 108 103  CO2 30 28 28 28 24   GLUCOSE 121* 136* 100* 90 90  BUN 13 13 15 12 10   CREATININE 0.98 0.93 0.86 0.75 0.69  CALCIUM 9.5 8.7* 8.8* 8.7* 8.5*   MG  --  1.8 1.8  --   --   PHOS  --   --  4.0  --   --    GFR Estimated Creatinine Clearance: 67.6 mL/min (by C-G formula based on SCr of 0.69 mg/dL). Liver Function Tests: No results for input(s): AST, ALT, ALKPHOS, BILITOT, PROT, ALBUMIN in the last 168 hours. No results for input(s): LIPASE, AMYLASE in the last 168 hours. No results for input(s): AMMONIA in the last 168 hours. Coagulation profile Recent Labs  Lab 01/06/21 2240  INR 1.3*   COVID-19 Labs  No results for input(s): DDIMER, FERRITIN, LDH, CRP in the last 72 hours.  Lab Results  Component Value Date   SARSCOV2NAA NEGATIVE 01/06/2021   SARSCOV2NAA POSITIVE (A) 04/26/2020    CBC: Recent Labs  Lab 01/02/21 1220 01/06/21 1433 01/06/21 2240 01/07/21 0207 01/08/21 0311 01/09/21 0430  WBC 9.5 9.6 8.8 8.5 7.3 7.8  NEUTROABS 7.9* 7.7  --   --   --   --   HGB 14.8 14.1 13.6 13.9 12.6* 12.1*  HCT 44.6 44.1 42.0 41.8 37.8* 36.0*  MCV 86.9 89.6 88.2 88.2 87.7 86.1  PLT 260 159 145* 155 144* 158   Cardiac Enzymes: No results for input(s): CKTOTAL, CKMB, CKMBINDEX, TROPONINI in the last 168 hours. BNP (last 3 results) No results for input(s): PROBNP in the last 8760 hours. CBG: Recent Labs  Lab 01/06/21 2019  GLUCAP 102*   D-Dimer: No results for input(s): DDIMER in the last 72 hours. Hgb A1c: No results for input(s): HGBA1C in the last 72 hours. Lipid Profile: No results for input(s): CHOL, HDL, LDLCALC, TRIG, CHOLHDL, LDLDIRECT in the last 72 hours. Thyroid function studies: No results for input(s): TSH, T4TOTAL, T3FREE, THYROIDAB in the last 72 hours.  Invalid input(s): FREET3 Anemia work up: No results for input(s): VITAMINB12, FOLATE, FERRITIN, TIBC, IRON, RETICCTPCT in the last 72 hours. Sepsis Labs: Recent Labs  Lab 01/06/21 2240 01/07/21 0207 01/08/21 0311 01/09/21 0430  WBC 8.8 8.5 7.3 7.8  LATICACIDVEN 1.7  --   --   --    Microbiology Recent Results (from the past 240 hour(s))   Resp Panel by RT-PCR (Flu A&B, Covid) Nasopharyngeal Swab     Status: None   Collection Time: 01/06/21  2:11 PM   Specimen: Nasopharyngeal Swab; Nasopharyngeal(NP) swabs in vial transport medium  Result Value Ref Range Status   SARS Coronavirus 2 by RT PCR NEGATIVE NEGATIVE Final    Comment: (NOTE) SARS-CoV-2 target nucleic acids are NOT DETECTED.  The SARS-CoV-2 RNA is generally detectable in upper respiratory specimens during the acute phase of infection. The lowest concentration of SARS-CoV-2  viral copies this assay can detect is 138 copies/mL. A negative result does not preclude SARS-Cov-2 infection and should not be used as the sole basis for treatment or other patient management decisions. A negative result may occur with  improper specimen collection/handling, submission of specimen other than nasopharyngeal swab, presence of viral mutation(s) within the areas targeted by this assay, and inadequate number of viral copies(<138 copies/mL). A negative result must be combined with clinical observations, patient history, and epidemiological information. The expected result is Negative.  Fact Sheet for Patients:  BloggerCourse.com  Fact Sheet for Healthcare Providers:  SeriousBroker.it  This test is no t yet approved or cleared by the [place] FDA and  has been authorized for detection and/or diagnosis of SARS-CoV-2 by FDA under an Emergency Use Authorization (EUA). This EUA will remain  in effect (meaning this test can be used) for the duration of the COVID-19 declaration under Section 564(b)(1) of the Act, 21 U.S.C.section 360bbb-3(b)(1), unless the authorization is terminated  or revoked sooner.       Influenza A by PCR NEGATIVE NEGATIVE Final   Influenza B by PCR NEGATIVE NEGATIVE Final    Comment: (NOTE) The Xpert Xpress SARS-CoV-2/FLU/RSV plus assay is intended as an aid in the diagnosis of influenza from  Nasopharyngeal swab specimens and should not be used as a sole basis for treatment. Nasal washings and aspirates are unacceptable for Xpert Xpress SARS-CoV-2/FLU/RSV testing.  Fact Sheet for Patients: BloggerCourse.com  Fact Sheet for Healthcare Providers: SeriousBroker.it  This test is not yet approved or cleared by the [place] FDA and has been authorized for detection and/or diagnosis of SARS-CoV-2 by FDA under an Emergency Use Authorization (EUA). This EUA will remain in effect (meaning this test can be used) for the duration of the COVID-19 declaration under Section 564(b)(1) of the Act, 21 U.S.C. section 360bbb-3(b)(1), unless the authorization is terminated or revoked.  Performed at [facility], [address], [place], [place] [address]   MRSA Next Gen by PCR, Nasal     Status: None   Collection Time: 01/06/21  8:36 PM   Specimen: Nasal Mucosa; Nasal Swab  Result Value Ref Range Status   MRSA by PCR Next Gen NOT DETECTED NOT DETECTED Final    Comment: (NOTE) The GeneXpert MRSA Assay (FDA approved for NASAL specimens only), is one component of a comprehensive MRSA colonization surveillance program. It is not intended to diagnose MRSA infection nor to guide or monitor treatment for MRSA infections. Test performance is not FDA approved in patients less than 44 years old. Performed at [facility], [address], [place], [place] [address]      Medications:    donepezil  10 mg Oral QHS   feeding supplement  237 mL Oral TID BM   mirtazapine  15 mg Oral QHS   multivitamin with minerals  1 tablet Oral Daily   QUEtiapine  25 mg Oral Daily   sertraline  25 mg Oral Daily   Continuous Infusions:  heparin 1,000 Units/hr (01/09/21 0240)   magnesium sulfate bolus IVPB 50 mL/hr at 01/07/21 1528      LOS: 3 days   Marinda Elk  [facility]  01/09/2021, 11:36 AM

## 2021-01-09 NOTE — TOC Transition Note (Signed)
Transition of Care [facility]) - CM/SW Discharge Note   Patient Details  Name: Jonathan Berry MRN: [id number] Date of Birth: 07/20/1945  Transition of Care [facility]) CM/SW Contact:  Lawerance Sabal, RN Phone Number: 01/09/2021, 1:37 PM   Clinical Narrative:    [name] w spouse at bedside. She confirms patient is from memory care at [facility]. Discussed DC plan, and she would like return to ALF with PT through [facility].  Discussed DME and they are considering a RW, although she is not sure if it will cause him more confusion. I provided RW in room for them to use next time he is out of bed with staff to help them decide.   Will need HH PT order with "for outpatient therapy services through [facility]" in comment section faxed to [place] at [phone].       Barriers to Discharge: Continued Medical Work up   Patient Goals and CMS Choice Patient states their goals for this hospitalization and ongoing recovery are:: to go home- per spouse CMS Medicare.gov Compare Post Acute Care list provided to:: Other (Comment Required) Choice offered to / list presented to : Spouse  Discharge Placement                       Discharge Plan and Services     Post Acute Care Choice: Home Health          DME Arranged: N/A         HH Arranged: PT          Social Determinants of Health (SDOH) Interventions     Readmission Risk Interventions No flowsheet data found.

## 2021-01-09 NOTE — Care Management Important Message (Signed)
Important Message  Patient Details  Name: Jonathan Berry MRN: [id number] Date of Birth: 11-Apr-1945   Medicare Important Message Given:  Yes     Renie Ora 01/09/2021, 11:13 AM

## 2021-01-09 NOTE — Progress Notes (Signed)
ANTICOAGULATION CONSULT NOTE   Pharmacy Consult for heparin Indication: pulmonary embolus  No Known Allergies  Patient Measurements: Height: 5\' 8"  (172.7 cm) Weight: 59.9 kg (132 lb 0.9 oz) IBW/kg (Calculated) : 68.4 Heparin Dosing Weight: TBW  Vital Signs: Temp: 97.7 F (36.5 C) (12/28 0446) Temp Source: Axillary (12/28 0446) BP: 133/67 (12/28 0446) Pulse Rate: 66 (12/28 0446)  Labs: Recent Labs    01/06/21 1433 01/06/21 1618 01/06/21 2240 01/07/21 0207 01/07/21 1109 01/07/21 2009 01/08/21 0311 01/09/21 0430  HGB 14.1  --  13.6 13.9  --   --  12.6* 12.1*  HCT 44.1  --  42.0 41.8  --   --  37.8* 36.0*  PLT 159  --  145* 155  --   --  144* 158  APTT  --   --  33  --   --   --   --   --   LABPROT  --   --  16.1*  --   --   --   --   --   INR  --   --  1.3*  --   --   --   --   --   HEPARINUNFRC  --   --   --   --    < > 0.38 0.45 0.39  CREATININE 0.93  --   --  0.86  --   --  0.75 0.69  TROPONINIHS 496* 595*  --   --   --   --   --   --    < > = values in this interval not displayed.     Estimated Creatinine Clearance: 67.6 mL/min (by C-G formula based on SCr of 0.69 mg/dL).   Medical History: Past Medical History:  Diagnosis Date   Alzheimer disease (HCC)    COVID    Hypercholesteremia    Hypertension     Assessment: 31 YOM presenting with syncopal events, CT angio with PE, RHS. He is s/p tPA infusion 50mg  on 12/25 and now on heparin  -heparin level at goal on 1000 units/hr -cost of apixaban and rivaroxaban is $47 per month  Goal of Therapy:  Heparin level 0.3-0.7 units/ml Monitor platelets by anticoagulation protocol: Yes   Plan:  Continue heparin gtt to 1000 units/hr Heparin level and CBC in am Will follow oral anticoagulation plans  , PharmD Clinical Pharmacist **Pharmacist phone directory can now be found on [email or web address] ([email or web address]).  Listed under [facility].

## 2021-01-10 DIAGNOSIS — I2609 Other pulmonary embolism with acute cor pulmonale: Secondary | ICD-10-CM | POA: Diagnosis not present

## 2021-01-10 LAB — BASIC METABOLIC PANEL
Anion gap: 7 (ref 5–15)
BUN: 10 mg/dL (ref 8–23)
CO2: 29 mmol/L (ref 22–32)
Calcium: 8.7 mg/dL — ABNORMAL LOW (ref 8.9–10.3)
Chloride: 102 mmol/L (ref 98–111)
Creatinine, Ser: 0.9 mg/dL (ref 0.61–1.24)
GFR, Estimated: >60 mL/min (ref >60)
Glucose, Bld: 92 mg/dL (ref 70–99)
Potassium: 3.8 mmol/L (ref 3.5–5.1)
Sodium: 138 mmol/L (ref 135–145)

## 2021-01-10 LAB — CBC
HCT: 36.4 % — ABNORMAL LOW (ref 39.0–52.0)
Hemoglobin: 12.1 g/dL — ABNORMAL LOW (ref 13.0–17.0)
MCH: 28.9 pg (ref 26.0–34.0)
MCHC: 33.2 g/dL (ref 30.0–36.0)
MCV: 87.1 fL (ref 80.0–100.0)
Platelets: 184 10*3/uL (ref 150–400)
RBC: 4.18 MIL/uL — ABNORMAL LOW (ref 4.22–5.81)
RDW: 12.8 % (ref 11.5–15.5)
WBC: 10 10*3/uL (ref 4.0–10.5)
nRBC: 0 % (ref 0.0–0.2)

## 2021-01-10 LAB — HEPARIN LEVEL (UNFRACTIONATED): Heparin Unfractionated: 0.28 IU/mL — ABNORMAL LOW (ref 0.30–0.70)

## 2021-01-10 MED ORDER — APIXABAN 5 MG PO TABS: 5.0000 mg | ORAL_TABLET | Freq: Two times a day (BID) | ORAL | Status: DC

## 2021-01-10 MED ORDER — APIXABAN (ELIQUIS) VTE STARTER PACK (10MG AND 5MG): ORAL_TABLET | ORAL | 0 refills | Status: DC

## 2021-01-10 MED ORDER — APIXABAN 5 MG PO TABS
10.0000 mg | ORAL_TABLET | Freq: Two times a day (BID) | ORAL | Status: DC
  Administered 2021-01-10: 12:00:00 10 mg via ORAL
  Filled 2021-01-10: qty 2

## 2021-01-10 MED ORDER — LORAZEPAM 1 MG PO TABS
0.5000 mg | ORAL_TABLET | Freq: Two times a day (BID) | ORAL | 0 refills | Status: DC | PRN
Start: 2021-01-10 — End: 2023-03-24

## 2021-01-10 NOTE — Progress Notes (Signed)
Flag on patients chart states that patients home medications are being stored in the main pharmacy.  Patient is from a memory care unit.  Spoke to wife and Norman Herrlich in the main pharmacy, there are no home medications for the patient being stored there.

## 2021-01-10 NOTE — TOC Transition Note (Signed)
Transition of Care [facility]) - CM/SW Discharge Note   Patient Details  Name: Jonathan Berry MRN: [id number] Date of Birth: 05-30-1945  [facility] of [facility]) CM/SW Contact:  Baldemar Lenis, LCSW Phone Number: 01/10/2021, 4:14 PM   Clinical Narrative:   Nurse to call report to [phone].    Final next level of care: Memory Care Barriers to Discharge: Barriers Resolved   Patient Goals and CMS Choice Patient states their goals for this hospitalization and ongoing recovery are:: to go home- per spouse CMS Medicare.gov Compare Post Acute Care list provided to:: Other (Comment Required) Choice offered to / list presented to : Spouse  Discharge Placement              Patient chooses bed at: [facility] Patient to be transferred to facility by: Family Name of family member notified: [place] Patient and family notified of of transfer: 01/10/21  Discharge Plan and Services     Post Acute Care Choice: Home Health          DME Arranged: N/A         HH Arranged: PT          Social Determinants of Health (SDOH) Interventions     Readmission Risk Interventions No flowsheet data found.

## 2021-01-10 NOTE — Progress Notes (Signed)
Report called to [facility]. Spoke to [employer], med tech.

## 2021-01-10 NOTE — Progress Notes (Addendum)
ANTICOAGULATION CONSULT NOTE   Pharmacy Consult for heparin Indication: pulmonary embolus  No Known Allergies  Patient Measurements: Height: 5\' 8"  (172.7 cm) Weight: 60.4 kg (133 lb 2.5 oz) IBW/kg (Calculated) : 68.4 Heparin Dosing Weight: TBW  Vital Signs: Temp: 98 F (36.7 C) (12/29 0505) Temp Source: Oral (12/29 0505) BP: 128/71 (12/29 0505) Pulse Rate: 66 (12/29 0505)  Labs: Recent Labs    01/08/21 0311 01/09/21 0430 01/10/21 0131  HGB 12.6* 12.1* 12.1*  HCT 37.8* 36.0* 36.4*  PLT 144* 158 184  HEPARINUNFRC 0.45 0.39 0.28*  CREATININE 0.75 0.69 0.90     Estimated Creatinine Clearance: 60.6 mL/min (by C-G formula based on SCr of 0.9 mg/dL).   Medical History: Past Medical History:  Diagnosis Date   Alzheimer disease (HCC)    COVID    Hypercholesteremia    Hypertension     Assessment: 66 YOM presenting with syncopal events, CT angio with PE, RHS. He is s/p tPA infusion 50mg  on 12/25 and now on heparin  -heparin level below goal on 1000 units/hr -cost of apixaban and rivaroxaban is $47 per month  Goal of Therapy:  Heparin level 0.3-0.7 units/ml Monitor platelets by anticoagulation protocol: Yes   Plan:  Increase heparin to 1100 units/hr Plans noted for or oral anticoagulation today  , PharmD Clinical Pharmacist **Pharmacist phone directory can now be found on [email or web address] ([email or web address]).  Listed under [facility].  Addendum -Change to apixaban  Plan -Apixaban 10mg  po bid for 7 days then 5mg  bid  Harland German, PharmD Clinical Pharmacist **Pharmacist phone directory can now be found on [email or web address] ([email or web address]).  Listed under [facility].

## 2021-01-10 NOTE — Progress Notes (Signed)
Physical Therapy Treatment Patient Details Name: Jonathan Berry MRN: [id number] DOB: 08-05-1945 Today's Date: 01/10/2021   History of Present Illness Pt is a 75 y.o. M who presents 01/06/2021 with syncopal episode; found to have lateral submassive PE with evidence of right heart strain. Given half dose tpa. Significant PMH: Alzheimer disease, HLD, HTN.    PT Comments    Pt and wife introduced to RW usage for improved stability with ambulation. Pt initially resistant however when cued to "pushing shopping cart" pt receptive as wife reports he really likes to go grocery shopping. Pt able to ambulate with min A and improved balance using RW. Educated wife on fact that with assist of HHPT at [facility] that pt will likely benefit from RW use however he should not be using it except with therapy until he is competent with its use. Wife verbalizes understanding.    Recommendations for follow up therapy are one component of a multi-disciplinary discharge planning process, led by the attending physician.  Recommendations may be updated based on patient status, additional functional criteria and insurance authorization.  Follow Up Recommendations  Home health PT (at [facility])     Assistance Recommended at Discharge Frequent or constant Supervision/Assistance  Equipment Recommendations  None recommended by PT       Precautions / Restrictions Precautions Precautions: Fall Restrictions Weight Bearing Restrictions: No     Mobility  Bed Mobility Overal bed mobility: Needs Assistance Bed Mobility: Supine to Sit     Supine to sit: Min assist     General bed mobility comments: on BSC on entry    Transfers Overall transfer level: Needs assistance Equipment used: None Transfers: Sit to/from Stand Sit to Stand: Min guard;Min assist           General transfer comment: min guard for safety able to self steady, requires min A for dynamic balance with performing pericare     Ambulation/Gait Ambulation/Gait assistance: Min assist Gait Distance (Feet): 20 Feet (+2) Assistive device: 1 person hand held assist;Rolling walker (2 wheels) Gait Pattern/deviations: Step-through pattern;Decreased stride length Gait velocity: decreased Gait velocity interpretation: <1.31 ft/sec, indicative of household ambulator   General Gait Details: requires min A HHA for steadying for ambulation from [facility] to bed, able to introduce RW however initially does not like RW use, with constant cuing to push like grocery cart pt agreeable to use because wife reports he has always liked shopping, it is possible that with training pt could learn to use effectively, wife reports gait is much more stable with RW. Provided wife wtih education that hospital would provide RW however it will not be safe for him to use until he recieves repeated training from HHPT at discharge. WIfe vebalizes understanding.          Balance Overall balance assessment: Needs assistance Sitting-balance support: Feet supported Sitting balance-Leahy Scale: Fair     Standing balance support: Single extremity supported;During functional activity Standing balance-Leahy Scale: Fair                              Cognition Arousal/Alertness: Awake/alert Behavior During Therapy: Flat affect Overall Cognitive Status: History of cognitive impairments - at baseline                                 General Comments: History of Alzhemier's, pt following intermittent verbal and tactile cues, communicative  General Comments General comments (skin integrity, edema, etc.): VSS on RA      Pertinent Vitals/Pain Pain Assessment: Faces Faces Pain Scale: No hurt Breathing: normal Negative Vocalization: none Facial Expression: sad, frightened, frown Body Language: tense, distressed pacing, fidgeting Consolability: no need to console PAINAD Score: 2    Home Living Family/patient  expects to be discharged to:: Assisted living                   Additional Comments: Memory care at [facility], 2 weeks    Prior Function            PT Goals (current goals can now be found in the care plan section) Acute Rehab PT Goals Patient Stated Goal: pt wife would like him to return to memory care unit PT Goal Formulation: With patient Time For Goal Achievement: 01/22/21 Potential to Achieve Goals: Fair Progress towards PT goals: Progressing toward goals    Frequency    Min 3X/week      PT Plan Current plan remains appropriate       AM-PAC PT "6 Clicks" Mobility   Outcome Measure  Help needed turning from your back to your side while in a flat bed without using bedrails?: A Little Help needed moving from lying on your back to sitting on the side of a flat bed without using bedrails?: A Little Help needed moving to and from a bed to a chair (including a wheelchair)?: A Little Help needed standing up from a chair using your arms (e.g., wheelchair or bedside chair)?: A Little Help needed to walk in hospital room?: A Little Help needed climbing 3-5 steps with a railing? : A Lot 6 Click Score: 17    End of Session Equipment Utilized During Treatment: Gait belt Activity Tolerance: Patient tolerated treatment well Patient left: in chair;with call bell/phone within reach;with chair alarm set;with family/visitor present Nurse Communication: Mobility status PT Visit Diagnosis: Unsteadiness on feet (R26.81);History of falling (Z91.81);Difficulty in walking, not elsewhere classified (R26.2)     Time: 1027-2536 PT Time Calculation (min) (ACUTE ONLY): 27 min  Charges:  $Gait Training: 8-22 mins $Therapeutic Activity: 8-22 mins                     Jonathan Berry B. Beverely Risen PT, DPT Acute Rehabilitation Services Pager [phone] Office [phone]    Jonathan Berry 01/10/2021, 11:38 AM

## 2021-01-10 NOTE — [place] FL2 (Signed)
[place] MEDICAID FL2 LEVEL OF CARE SCREENING TOOL     IDENTIFICATION  Patient Name: Jonathan Berry Birthdate: Jul 18, 1945 Sex: male Admission Date (Current Location): 01/06/2021  [facility] and [place] Number:  Producer, television/film/video and Address:  [facility] [place], [address], [place], [place] [address]      Provider Number: [id number]  Attending Physician Name and Address:  Marinda Elk, MD  Relative Name and Phone Number:       Current Level of Care: Hospital Recommended Level of Care: Memory Care Prior Approval Number:    Date Approved/Denied:   PASRR Number:    Discharge Plan: Other (Comment) (Memory Care)    Current Diagnoses: Patient Active Problem List   Diagnosis Date Noted   Protein-calorie malnutrition, severe 01/08/2021   Acute pulmonary embolism with acute cor pulmonale (HCC) 01/06/2021   Pneumonia due to COVID-19 virus 04/25/2020   ARF (acute renal failure) (HCC) 04/25/2020   DNR (do not resuscitate) 04/25/2020   Essential hypertension 04/25/2020    Orientation RESPIRATION BLADDER Height & Weight     Self  Normal Incontinent Weight: 133 lb 2.5 oz (60.4 kg) Height:  5\' 8"  (172.7 cm)  BEHAVIORAL SYMPTOMS/MOOD NEUROLOGICAL BOWEL NUTRITION STATUS      Incontinent Diet (Regular)  AMBULATORY STATUS COMMUNICATION OF NEEDS Skin   Limited Assist Verbally                         Personal Care Assistance Level of Assistance  Bathing, Feeding, Dressing Bathing Assistance: Limited assistance Feeding assistance: Limited assistance Dressing Assistance: Limited assistance     Functional Limitations Info             SPECIAL CARE FACTORS FREQUENCY  PT (By licensed PT)     PT Frequency: 3x/wk with home health              Contractures Contractures Info: Not present    Additional Factors Info  Code Status, Allergies, Psychotropic Code Status Info: DNR Allergies Info: NKA Psychotropic Info: Aricept 10mg  daily at  bed; Remeron 15mg  daily at bed; Seroquel 25mg  daily; Zoloft 25mg  daily         Current Medications (01/10/2021):  This is the current hospital active medication list Current Facility-Administered Medications  Medication Dose Route Frequency Provider Last Rate Last Admin   apixaban (ELIQUIS) tablet 10 mg  10 mg Oral BID , RPH   10 mg at 01/10/21 1225   Followed by   ON 01/17/2021] apixaban (ELIQUIS) tablet 5 mg  5 mg Oral BID 01/12/2021, RPH       docusate sodium (COLACE) capsule 100 mg  100 mg Oral BID PRN Silvana Newness, MD       donepezil (ARICEPT) tablet 10 mg  10 mg Oral QHS 01/12/21, MD   10 mg at 01/09/21 2142   feeding supplement (ENSURE ENLIVE / ENSURE PLUS) liquid 237 mL  237 mL Oral TID BM Silvana Newness B, MD   237 mL at 01/10/21 1029   LORazepam (ATIVAN) tablet 0.5 mg  0.5 mg Oral BID PRN Charlott Holler, MD       magnesium sulfate IVPB 2 g 50 mL  2 g Intravenous Once 01/11/21, MD 50 mL/hr at 01/07/21 1528 Restarted at 01/07/21 1528   mirtazapine (REMERON) tablet 15 mg  15 mg Oral QHS 01/12/21, MD   15 mg at 01/09/21 2142  multivitamin with minerals tablet 1 tablet  1 tablet Oral Daily Hazeline Junker B, MD   1 tablet at 01/10/21 1026   polyethylene glycol (MIRALAX / GLYCOLAX) packet 17 g  17 g Oral Daily PRN Charlott Holler, MD       QUEtiapine (SEROQUEL) tablet 25 mg  25 mg Oral Daily Charlott Holler, MD   25 mg at 01/10/21 1026   sertraline (ZOLOFT) tablet 25 mg  25 mg Oral Daily Charlott Holler, MD   25 mg at 01/10/21 1027     Discharge Medications: TAKE these medications     Apixaban Starter Pack (10mg  and 5mg ) Commonly known as: ELIQUIS STARTER PACK Take as directed on package: start with two-5mg  tablets twice daily for 7 days. On day 8, switch to one-5mg  tablet twice daily.    donepezil 10 MG tablet Commonly known as: ARICEPT Take 10 mg by mouth at bedtime.    LORazepam 1 MG tablet Commonly known as: ATIVAN Take 0.5 mg by  mouth 2 (two) times daily as needed for anxiety.    mirtazapine 15 MG tablet Commonly known as: REMERON Take 15 mg by mouth at bedtime.    QUEtiapine 25 MG tablet Commonly known as: SEROQUEL Take 25 mg by mouth daily.    sertraline 25 MG tablet Commonly known as: ZOLOFT Take 25 mg by mouth daily.    Relevant Imaging Results:  Relevant Lab Results:   Additional Information    , LCSW

## 2021-01-10 NOTE — Progress Notes (Incomplete)
[facility] PROGRESS NOTE    Progress Note  Jonathan Berry  HCW:[id number] DOB: 1945-02-23 DOA: 01/06/2021 PCP: Oneita Hurt, No     Brief Narrative:   Jonathan Berry is an 75 y.o. male past medical history of Alzheimer's dementia frequent falls, essential hypertension who was transferred from a memory care unit with a syncopal event that happened on this day, became unresponsive while being fed lunch, in the ED was found hypotensive hypoxic CTA revealed bilateral PE with right heart strain    Assessment/Plan:   Acute submassive pulmonary embolism with acute cor pulmonale (HCC) Status post tPA on 12/25, no inciting factors. Transition to oral Eliquis Physical therapy recommended home health PT. Will consult TOC to assess whether his facility can meet his needs.  Alzheimer's dementia: Diagnosed about 4 years ago continue benazepril, Seroquel, SSRI and Ativan as needed. At high risk for aspiration  Poor oral intake severe protein-calorie malnutrition,  At high risk of acute confusional state continue nightly medications.    DVT prophylaxis: heparin Family Communication:wife Status is: Inpatient  Remains inpatient appropriate because: Acute bilateral saddle PEs        Code Status:     Code Status Orders  (From admission, onward)           Start     Ordered   01/06/21 1810  Do not attempt resuscitation (DNR)  Continuous       Question Answer Comment  In the event of cardiac or respiratory ARREST Do not call a code blue   In the event of cardiac or respiratory ARREST Do not perform Intubation, CPR, defibrillation or ACLS   In the event of cardiac or respiratory ARREST Use medication by any route, position, wound care, and other measures to relive pain and suffering. May use oxygen, suction and manual treatment of airway obstruction as needed for comfort.      01/06/21 1811           Code Status History     Date Active Date Inactive Code Status Order ID  Comments User Context   01/06/2021 1737 01/06/2021 1811 DNR [id number]  Terald Sleeper, MD ED   04/25/2020 2326 04/29/2020 2013 DNR [id number]  Eduard Clos, MD ED      Advance Directive Documentation    Flowsheet Row Most Recent Value  Type of Advance Directive Living will  Pre-existing out of facility DNR order (yellow form or pink MOST form) --  "MOST" Form in Place? --         IV Access:   Peripheral IV   Procedures and diagnostic studies:   No results found.   Medical Consultants:   None.   Subjective:    Jonathan Berry relates his breathing is at baseline.  Objective:    Vitals:   01/09/21 0446 01/09/21 1626 01/09/21 2021 01/10/21 0505  BP: 133/67 112/61 116/66 128/71  Pulse: 66 72 85 66  Resp: 18 (!) 21 17 17   Temp: 97.7 F (36.5 C) (!) 97.2 F (36.2 C) 98.1 F (36.7 C) 98 F (36.7 C)  TempSrc: Axillary Axillary Oral Oral  SpO2: 99% 96% 99% 98%  Weight: 59.9 kg   60.4 kg  Height:       SpO2: 98 %   Intake/Output Summary (Last 24 hours) at 01/10/2021 1038 Last data filed at 01/10/2021 0932 Gross per 24 hour  Intake 480 ml  Output --  Net 480 ml    Filed Weights   01/07/21 0500 01/09/21 0446 01/10/21 0505  Weight: 64.4 kg 59.9 kg 60.4 kg    Exam: General exam: In no acute distress. Respiratory system: Good air movement and clear to auscultation. Cardiovascular system: S1 & S2 heard, RRR. No JVD. Gastrointestinal system: Abdomen is nondistended, soft and nontender.  Extremities: No pedal edema. Skin: No rashes, lesions or ulcers  Data Reviewed:    Labs: Basic Metabolic Panel: Recent Labs  Lab 01/06/21 1433 01/07/21 0207 01/08/21 0311 01/09/21 0430 01/10/21 0131  NA 145 147* 144 138 138  K 3.9 3.4* 3.9 3.9 3.8  CL 108 108 108 103 102  CO2 28 28 28 24 29   GLUCOSE 136* 100* 90 90 92  BUN 13 15 12 10 10   CREATININE 0.93 0.86 0.75 0.69 0.90  CALCIUM 8.7* 8.8* 8.7* 8.5* 8.7*  MG 1.8 1.8  --   --   --   PHOS  --   4.0  --   --   --     GFR Estimated Creatinine Clearance: 60.6 mL/min (by C-G formula based on SCr of 0.9 mg/dL). Liver Function Tests: No results for input(s): AST, ALT, ALKPHOS, BILITOT, PROT, ALBUMIN in the last 168 hours. No results for input(s): LIPASE, AMYLASE in the last 168 hours. No results for input(s): AMMONIA in the last 168 hours. Coagulation profile Recent Labs  Lab 01/06/21 2240  INR 1.3*    COVID-19 Labs  No results for input(s): DDIMER, FERRITIN, LDH, CRP in the last 72 hours.  Lab Results  Component Value Date   SARSCOV2NAA NEGATIVE 01/06/2021   SARSCOV2NAA POSITIVE (A) 04/26/2020    CBC: Recent Labs  Lab 01/06/21 1433 01/06/21 2240 01/07/21 0207 01/08/21 0311 01/09/21 0430 01/10/21 0131  WBC 9.6 8.8 8.5 7.3 7.8 10.0  NEUTROABS 7.7  --   --   --   --   --   HGB 14.1 13.6 13.9 12.6* 12.1* 12.1*  HCT 44.1 42.0 41.8 37.8* 36.0* 36.4*  MCV 89.6 88.2 88.2 87.7 86.1 87.1  PLT 159 145* 155 144* 158 184    Cardiac Enzymes: No results for input(s): CKTOTAL, CKMB, CKMBINDEX, TROPONINI in the last 168 hours. BNP (last 3 results) No results for input(s): PROBNP in the last 8760 hours. CBG: Recent Labs  Lab 01/06/21 2019  GLUCAP 102*    D-Dimer: No results for input(s): DDIMER in the last 72 hours. Hgb A1c: No results for input(s): HGBA1C in the last 72 hours. Lipid Profile: No results for input(s): CHOL, HDL, LDLCALC, TRIG, CHOLHDL, LDLDIRECT in the last 72 hours. Thyroid function studies: No results for input(s): TSH, T4TOTAL, T3FREE, THYROIDAB in the last 72 hours.  Invalid input(s): FREET3 Anemia work up: No results for input(s): VITAMINB12, FOLATE, FERRITIN, TIBC, IRON, RETICCTPCT in the last 72 hours. Sepsis Labs: Recent Labs  Lab 01/06/21 2240 01/07/21 0207 01/08/21 0311 01/09/21 0430 01/10/21 0131  WBC 8.8 8.5 7.3 7.8 10.0  LATICACIDVEN 1.7  --   --   --   --     Microbiology Recent Results (from the past 240 hour(s))  Resp  Panel by RT-PCR (Flu A&B, Covid) Nasopharyngeal Swab     Status: None   Collection Time: 01/06/21  2:11 PM   Specimen: Nasopharyngeal Swab; Nasopharyngeal(NP) swabs in vial transport medium  Result Value Ref Range Status   SARS Coronavirus 2 by RT PCR NEGATIVE NEGATIVE Final    Comment: (NOTE) SARS-CoV-2 target nucleic acids are NOT DETECTED.  The SARS-CoV-2 RNA is generally detectable in upper respiratory specimens during the acute phase of infection.  The lowest concentration of SARS-CoV-2 viral copies this assay can detect is 138 copies/mL. A negative result does not preclude SARS-Cov-2 infection and should not be used as the sole basis for treatment or other patient management decisions. A negative result may occur with  improper specimen collection/handling, submission of specimen other than nasopharyngeal swab, presence of viral mutation(s) within the areas targeted by this assay, and inadequate number of viral copies(<138 copies/mL). A negative result must be combined with clinical observations, patient history, and epidemiological information. The expected result is Negative.  Fact Sheet for Patients:  BloggerCourse.com  Fact Sheet for Healthcare Providers:  SeriousBroker.it  This test is no t yet approved or cleared by the [place] FDA and  has been authorized for detection and/or diagnosis of SARS-CoV-2 by FDA under an Emergency Use Authorization (EUA). This EUA will remain  in effect (meaning this test can be used) for the duration of the COVID-19 declaration under Section 564(b)(1) of the Act, 21 U.S.C.section 360bbb-3(b)(1), unless the authorization is terminated  or revoked sooner.       Influenza A by PCR NEGATIVE NEGATIVE Final   Influenza B by PCR NEGATIVE NEGATIVE Final    Comment: (NOTE) The Xpert Xpress SARS-CoV-2/FLU/RSV plus assay is intended as an aid in the diagnosis of influenza from Nasopharyngeal  swab specimens and should not be used as a sole basis for treatment. Nasal washings and aspirates are unacceptable for Xpert Xpress SARS-CoV-2/FLU/RSV testing.  Fact Sheet for Patients: BloggerCourse.com  Fact Sheet for Healthcare Providers: SeriousBroker.it  This test is not yet approved or cleared by the [place] FDA and has been authorized for detection and/or diagnosis of SARS-CoV-2 by FDA under an Emergency Use Authorization (EUA). This EUA will remain in effect (meaning this test can be used) for the duration of the COVID-19 declaration under Section 564(b)(1) of the Act, 21 U.S.C. section 360bbb-3(b)(1), unless the authorization is terminated or revoked.  Performed at [facility], [address] [place], [place] [address]   MRSA Next Gen by PCR, Nasal     Status: None   Collection Time: 01/06/21  8:36 PM   Specimen: Nasal Mucosa; Nasal Swab  Result Value Ref Range Status   MRSA by PCR Next Gen NOT DETECTED NOT DETECTED Final    Comment: (NOTE) The GeneXpert MRSA Assay (FDA approved for NASAL specimens only), is one component of a comprehensive MRSA colonization surveillance program. It is not intended to diagnose MRSA infection nor to guide or monitor treatment for MRSA infections. Test performance is not FDA approved in patients less than 46 years old. Performed at [facility], [address], [place], [place] [address]      Medications:    donepezil  10 mg Oral QHS   feeding supplement  237 mL Oral TID BM   mirtazapine  15 mg Oral QHS   multivitamin with minerals  1 tablet Oral Daily   QUEtiapine  25 mg Oral Daily   sertraline  25 mg Oral Daily   Continuous Infusions:  heparin 1,100 Units/hr (01/10/21 0841)   magnesium sulfate bolus IVPB 50 mL/hr at 01/07/21 1528      LOS: 4 days   Marinda Elk  [facility]  01/10/2021, 10:38 AM

## 2021-01-10 NOTE — Discharge Instructions (Signed)

## 2021-01-10 NOTE — Discharge Summary (Signed)
Physician Discharge Summary  Jonathan Berry [id number] DOB: July 17, 1945 DOA: 01/06/2021  PCP: Merryl Hacker, No  Admit date: 01/06/2021 Discharge date: 01/10/2021  Admitted From: ALF Disposition:  ALF  Recommendations for Outpatient Follow-up:  Follow up with PCP in 1-2 weeks Please obtain BMP/CBC in one week   Home Health:Yes Equipment/Devices:None  Discharge Condition:Stable CODE STATUS: DNR Diet recommendation: Heart Healthy  Brief/Interim Summary: 75 y.o. male past medical history of Alzheimer's dementia frequent falls, essential hypertension who was transferred from a memory care unit with a syncopal event that happened on this day, became unresponsive while being fed lunch, in the ED was found hypotensive hypoxic CTA revealed bilateral PE with right heart strain  Discharge Diagnoses:  Principal Problem:   Acute pulmonary embolism with acute cor pulmonale (HCC) Active Problems:   Protein-calorie malnutrition, severe  Acute to massive pulmonary embolism with cor pulmonale: Status post tPA on 01/06/2021, there were no inciting factors he was continued on IV heparin for 72 hours and transition to oral Eliquis. Physical therapy evaluated the patient, he will go to memory care unit with physical therapy. He will continue Eliquis as an outpatient.  Alzheimer's dementia: Diagnosed about 4 years ago continue benazepril, Seroquel, SSRI and Ativan as needed. Poor oral intake/severe protein caloric malnutrition: Noted.    Discharge Instructions   Allergies as of 01/10/2021   No Known Allergies      Medication List     TAKE these medications    Apixaban Starter Pack (10mg  and 5mg ) Commonly known as: ELIQUIS STARTER PACK Take as directed on package: start with two-5mg  tablets twice daily for 7 days. On day 8, switch to one-5mg  tablet twice daily.   donepezil 10 MG tablet Commonly known as: ARICEPT Take 10 mg by mouth at bedtime.   LORazepam 1 MG tablet Commonly known as:  ATIVAN Take 0.5 mg by mouth 2 (two) times daily as needed for anxiety.   mirtazapine 15 MG tablet Commonly known as: REMERON Take 15 mg by mouth at bedtime.   QUEtiapine 25 MG tablet Commonly known as: SEROQUEL Take 25 mg by mouth daily.   sertraline 25 MG tablet Commonly known as: ZOLOFT Take 25 mg by mouth daily.        No Known Allergies  Consultations: Pulmonary and critical care   Procedures/Studies: CT Head Wo Contrast  Result Date: 01/02/2021 CLINICAL DATA:  Provided history: Head trauma, minor. Facial trauma, blunt. Neck trauma. Additional history provided: Fall, laceration to chin and lip. EXAM: CT HEAD WITHOUT CONTRAST CT MAXILLOFACIAL WITHOUT CONTRAST CT CERVICAL SPINE WITHOUT CONTRAST TECHNIQUE: Multidetector CT imaging of the head, cervical spine, and maxillofacial structures were performed using the standard protocol without intravenous contrast. Multiplanar CT image reconstructions of the cervical spine and maxillofacial structures were also generated. COMPARISON:  No pertinent prior exams available for comparison. FINDINGS: CT HEAD FINDINGS Brain: Moderate generalized cerebral atrophy. Comparatively mild cerebellar atrophy. Commensurate prominence of the ventricles and sulci. Mild ill-defined hypoattenuation within the cerebral white matter, nonspecific but compatible with chronic small vessel ischemic disease. There is no acute intracranial hemorrhage. No demarcated cortical infarct. No extra-axial fluid collection. No evidence of an intracranial mass. No midline shift. Vascular: No hyperdense vessel. Atherosclerotic calcifications. Skull: Normal. Negative for fracture or focal lesion. Other: No significant mastoid effusion. CT MAXILLOFACIAL FINDINGS Osseous: No acute maxillofacial fracture is identified. Orbits: No acute or significant orbital finding. Sinuses: Tiny mucous retention cyst and trace mucosal thickening within the right maxillary sinus. Trace mucosal  thickening within the left  maxillary sinus. Fluid opacification of the right ethmoid air cell. Soft tissues: No definite soft tissue swelling/hematoma appreciable by CT. CT CERVICAL SPINE FINDINGS Alignment: Mild cervicothoracic levocurvature. No significant spondylolisthesis. Skull base and vertebrae: The basion-dental and atlanto-dental intervals are maintained.No evidence of acute fracture to the cervical spine. At C4-C5, there is fusion across the disc space and facet joint ankylosis. Multilevel ventral osteophytes. Soft tissues and spinal canal: No prevertebral fluid or swelling. No visible canal hematoma. Disc levels: Cervical spondylosis with multilevel disc space narrowing, disc bulges/central disc protrusions, posterior disc osteophytes, endplate spurring, uncovertebral hypertrophy and facet arthrosis. No appreciable high-grade spinal canal stenosis. Multilevel bony neural foraminal narrowing. Upper chest: No consolidation within the imaged lung apices. No visible pneumothorax. Other: Calcified plaque within the visualized proximal major branch vessels of the neck and both carotid arteries. IMPRESSION: CT head: 1. No evidence of acute intracranial abnormality. 2. Mild chronic small vessel ischemic changes within the cerebral white matter. 3. Moderate generalized cerebral atrophy. Comparatively mild cerebellar atrophy. CT maxillofacial: 1. No evidence of acute maxillofacial fracture. 2. Mild paranasal sinus disease, as described. CT cervical spine: 1. No evidence of acute fracture to the cervical spine. 2. Mild cervicothoracic levocurvature. 3. Cervical spondylosis, as described. 4. C4-C5 ankylosis. Electronically Signed   By: Kellie Simmering D.O.   On: 01/02/2021 13:22   CT Angio Chest PE W and/or Wo Contrast  Result Date: 01/06/2021 CLINICAL DATA:  Loss of consciousness with 2 syncopal episodes. Concern for pulmonary embolus EXAM: CT ANGIOGRAPHY CHEST WITH CONTRAST TECHNIQUE: Multidetector CT imaging of  the chest was performed using the standard protocol during bolus administration of intravenous contrast. Multiplanar CT image reconstructions and MIPs were obtained to evaluate the vascular anatomy. CONTRAST:  14mL OMNIPAQUE IOHEXOL 350 MG/ML SOLN COMPARISON:  Chest radiograph May 08, 2020 FINDINGS: Cardiovascular: Pulmonary emboli in the bilateral lobar pulmonary arteries with extension into segmental and subsegmental branches. The right ventricular to left ventricular ratio is 2.4. Normal size heart. No significant pericardial effusion/thickening. Aortic atherosclerosis without aneurysmal dilation. Mediastinum/Nodes: No discrete thyroid nodule. No pathologically enlarged mediastinal, hilar or axillary lymph nodes. The trachea and esophagus are grossly unremarkable. Lungs/Pleura: Biapical pleuroparenchymal scarring. No suspicious pulmonary nodules or masses. No focal airspace consolidation. No pleural effusion. No pneumothorax. Upper Abdomen: No acute abnormality. Musculoskeletal: Multilevel degenerative changes spine. No acute osseous abnormality. Review of the MIP images confirms the above findings. IMPRESSION: 1. Positive for acute PE with CT evidence of right heart strain (RV/LV Ratio = 2.4) consistent with at least submassive (intermediate risk) PE. The presence of right heart strain has been associated with an increased risk of morbidity and mortality. 2. Aortic Atherosclerosis (ICD10-I70.0). These results were called by telephone at the time of interpretation on 01/06/2021 at 5:18 pm to provider PA Winfred Burn, who verbally acknowledged these results. Electronically Signed   By: Dahlia Bailiff M.D.   On: 01/06/2021 17:22   CT Cervical Spine Wo Contrast  Result Date: 01/02/2021 CLINICAL DATA:  Provided history: Head trauma, minor. Facial trauma, blunt. Neck trauma. Additional history provided: Fall, laceration to chin and lip. EXAM: CT HEAD WITHOUT CONTRAST CT MAXILLOFACIAL WITHOUT CONTRAST CT  CERVICAL SPINE WITHOUT CONTRAST TECHNIQUE: Multidetector CT imaging of the head, cervical spine, and maxillofacial structures were performed using the standard protocol without intravenous contrast. Multiplanar CT image reconstructions of the cervical spine and maxillofacial structures were also generated. COMPARISON:  No pertinent prior exams available for comparison. FINDINGS: CT HEAD FINDINGS Brain: Moderate generalized cerebral atrophy. Comparatively  mild cerebellar atrophy. Commensurate prominence of the ventricles and sulci. Mild ill-defined hypoattenuation within the cerebral white matter, nonspecific but compatible with chronic small vessel ischemic disease. There is no acute intracranial hemorrhage. No demarcated cortical infarct. No extra-axial fluid collection. No evidence of an intracranial mass. No midline shift. Vascular: No hyperdense vessel. Atherosclerotic calcifications. Skull: Normal. Negative for fracture or focal lesion. Other: No significant mastoid effusion. CT MAXILLOFACIAL FINDINGS Osseous: No acute maxillofacial fracture is identified. Orbits: No acute or significant orbital finding. Sinuses: Tiny mucous retention cyst and trace mucosal thickening within the right maxillary sinus. Trace mucosal thickening within the left maxillary sinus. Fluid opacification of the right ethmoid air cell. Soft tissues: No definite soft tissue swelling/hematoma appreciable by CT. CT CERVICAL SPINE FINDINGS Alignment: Mild cervicothoracic levocurvature. No significant spondylolisthesis. Skull base and vertebrae: The basion-dental and atlanto-dental intervals are maintained.No evidence of acute fracture to the cervical spine. At C4-C5, there is fusion across the disc space and facet joint ankylosis. Multilevel ventral osteophytes. Soft tissues and spinal canal: No prevertebral fluid or swelling. No visible canal hematoma. Disc levels: Cervical spondylosis with multilevel disc space narrowing, disc  bulges/central disc protrusions, posterior disc osteophytes, endplate spurring, uncovertebral hypertrophy and facet arthrosis. No appreciable high-grade spinal canal stenosis. Multilevel bony neural foraminal narrowing. Upper chest: No consolidation within the imaged lung apices. No visible pneumothorax. Other: Calcified plaque within the visualized proximal major branch vessels of the neck and both carotid arteries. IMPRESSION: CT head: 1. No evidence of acute intracranial abnormality. 2. Mild chronic small vessel ischemic changes within the cerebral white matter. 3. Moderate generalized cerebral atrophy. Comparatively mild cerebellar atrophy. CT maxillofacial: 1. No evidence of acute maxillofacial fracture. 2. Mild paranasal sinus disease, as described. CT cervical spine: 1. No evidence of acute fracture to the cervical spine. 2. Mild cervicothoracic levocurvature. 3. Cervical spondylosis, as described. 4. C4-C5 ankylosis. Electronically Signed   By: Kellie Simmering D.O.   On: 01/02/2021 13:22   DG Chest Portable 1 View  Result Date: 01/06/2021 CLINICAL DATA:  evaluate for aspiration EXAM: PORTABLE CHEST 1 VIEW COMPARISON:  May 08, 2020 FINDINGS: The cardiomediastinal silhouette is unchanged in contour. No pleural effusion. No pneumothorax. No acute pleuroparenchymal abnormality. Visualized abdomen is unremarkable. Multilevel degenerative changes of the thoracic spine. IMPRESSION: No acute cardiopulmonary abnormality. Electronically Signed   By: Valentino Saxon M.D.   On: 01/06/2021 15:00   ECHOCARDIOGRAM COMPLETE  Result Date: 01/07/2021    ECHOCARDIOGRAM REPORT   Patient Name:   Jonathan Berry Date of Exam: 01/07/2021 Medical Rec #:  [id number]   Height:       68.0 in Accession #:    [id number]  Weight:       142.0 lb Date of Birth:  [birth year]   BSA:          1.767 m Patient Age:    68 years    BP:           109/65 mmHg Patient Gender: M           HR:           75 bpm. Exam Location:  Inpatient  Procedure: 2D Echo, Cardiac Doppler and Color Doppler Indications:    Pulmonary embolus  History:        Patient has no prior history of Echocardiogram examinations.                 Risk Factors:Hypertension.  Sonographer:    Glo Herring Referring Phys: [id number]  S DESAI  Sonographer Comments: Suboptimal apical window. IMPRESSIONS  1. Left ventricular ejection fraction, by estimation, is 60 to 65%. The left ventricle has normal function. The left ventricle has no regional wall motion abnormalities. Left ventricular diastolic parameters are consistent with Grade I diastolic dysfunction (impaired relaxation).  2. Severe hypokineisis of the RV free wall with preserved apical contractility (McConnell's sign) is present. Right ventricular systolic function is mildly reduced. The right ventricular size is normal.  3. The mitral valve is normal in structure. Mild mitral valve regurgitation. No evidence of mitral stenosis.  4. The aortic valve is tricuspid. There is mild calcification of the aortic valve. There is mild thickening of the aortic valve. Aortic valve regurgitation is trivial. Aortic valve sclerosis/calcification is present, without any evidence of aortic stenosis.  5. The inferior vena cava is normal in size with greater than 50% respiratory variability, suggesting right atrial pressure of 3 mmHg. FINDINGS  Left Ventricle: Left ventricular ejection fraction, by estimation, is 60 to 65%. The left ventricle has normal function. The left ventricle has no regional wall motion abnormalities. The left ventricular internal cavity size was normal in size. There is  no left ventricular hypertrophy. Left ventricular diastolic parameters are consistent with Grade I diastolic dysfunction (impaired relaxation). Right Ventricle: Severe hypokineisis of the RV free wall with preserved apical contractility (McConnell's sign) is present. The right ventricular size is normal. No increase in right ventricular wall  thickness. Right ventricular systolic function is mildly reduced. Left Atrium: Left atrial size was normal in size. Right Atrium: Right atrial size was normal in size. Pericardium: There is no evidence of pericardial effusion. Mitral Valve: The mitral valve is normal in structure. Mild mitral annular calcification. Mild mitral valve regurgitation. No evidence of mitral valve stenosis. Tricuspid Valve: The tricuspid valve is normal in structure. Tricuspid valve regurgitation is trivial. No evidence of tricuspid stenosis. Aortic Valve: The aortic valve is tricuspid. There is mild calcification of the aortic valve. There is mild thickening of the aortic valve. Aortic valve regurgitation is trivial. Aortic valve sclerosis/calcification is present, without any evidence of aortic stenosis. Aortic valve mean gradient measures 6.0 mmHg. Aortic valve peak gradient measures 11.7 mmHg. Aortic valve area, by VTI measures 1.60 cm. Pulmonic Valve: The pulmonic valve was normal in structure. Pulmonic valve regurgitation is mild. No evidence of pulmonic stenosis. Aorta: The aortic root and ascending aorta are structurally normal, with no evidence of dilitation. Venous: The inferior vena cava is normal in size with greater than 50% respiratory variability, suggesting right atrial pressure of 3 mmHg. IAS/Shunts: No atrial level shunt detected by color flow Doppler.  LEFT VENTRICLE PLAX 2D LVIDd:         4.10 cm   Diastology LVIDs:         2.50 cm   LV e' medial:    5.44 cm/s LV PW:         1.10 cm   LV E/e' medial:  10.4 LV IVS:        1.10 cm   LV e' lateral:   6.96 cm/s LVOT diam:     2.10 cm   LV E/e' lateral: 8.1 LV SV:         52 LV SV Index:   29 LVOT Area:     3.46 cm  RIGHT VENTRICLE             IVC RV S prime:     14.10 cm/s  IVC diam: 2.10 cm LEFT  ATRIUM         Index LA diam:    2.90 cm 1.64 cm/m  AORTIC VALVE                     PULMONIC VALVE AV Area (Vmax):    1.59 cm      PV Vmax:       0.74 m/s AV Area (Vmean):    1.61 cm      PV Peak grad:  2.2 mmHg AV Area (VTI):     1.60 cm AV Vmax:           171.00 cm/s AV Vmean:          111.000 cm/s AV VTI:            0.324 m AV Peak Grad:      11.7 mmHg AV Mean Grad:      6.0 mmHg LVOT Vmax:         78.30 cm/s LVOT Vmean:        51.700 cm/s LVOT VTI:          0.150 m LVOT/AV VTI ratio: 0.46  AORTA Ao Root diam: 3.80 cm Ao Asc diam:  3.30 cm MITRAL VALVE               TRICUSPID VALVE MV Area (PHT): 3.51 cm    TR Peak grad:   15.1 mmHg MV Decel Time: 216 msec    TR Vmax:        194.00 cm/s MV E velocity: 56.60 cm/s MV A velocity: 72.00 cm/s  SHUNTS MV E/A ratio:  0.79        Systemic VTI:  0.15 m                            Systemic Diam: 2.10 cm Dani Gobble Croitoru MD Electronically signed by Sanda Klein MD Signature Date/Time: 01/07/2021/1:07:03 PM    Final    CT Maxillofacial Wo Contrast  Result Date: 01/02/2021 CLINICAL DATA:  Provided history: Head trauma, minor. Facial trauma, blunt. Neck trauma. Additional history provided: Fall, laceration to chin and lip. EXAM: CT HEAD WITHOUT CONTRAST CT MAXILLOFACIAL WITHOUT CONTRAST CT CERVICAL SPINE WITHOUT CONTRAST TECHNIQUE: Multidetector CT imaging of the head, cervical spine, and maxillofacial structures were performed using the standard protocol without intravenous contrast. Multiplanar CT image reconstructions of the cervical spine and maxillofacial structures were also generated. COMPARISON:  No pertinent prior exams available for comparison. FINDINGS: CT HEAD FINDINGS Brain: Moderate generalized cerebral atrophy. Comparatively mild cerebellar atrophy. Commensurate prominence of the ventricles and sulci. Mild ill-defined hypoattenuation within the cerebral white matter, nonspecific but compatible with chronic small vessel ischemic disease. There is no acute intracranial hemorrhage. No demarcated cortical infarct. No extra-axial fluid collection. No evidence of an intracranial mass. No midline shift. Vascular: No hyperdense  vessel. Atherosclerotic calcifications. Skull: Normal. Negative for fracture or focal lesion. Other: No significant mastoid effusion. CT MAXILLOFACIAL FINDINGS Osseous: No acute maxillofacial fracture is identified. Orbits: No acute or significant orbital finding. Sinuses: Tiny mucous retention cyst and trace mucosal thickening within the right maxillary sinus. Trace mucosal thickening within the left maxillary sinus. Fluid opacification of the right ethmoid air cell. Soft tissues: No definite soft tissue swelling/hematoma appreciable by CT. CT CERVICAL SPINE FINDINGS Alignment: Mild cervicothoracic levocurvature. No significant spondylolisthesis. Skull base and vertebrae: The basion-dental and atlanto-dental intervals are maintained.No evidence of acute fracture to the cervical spine. At C4-C5, there  is fusion across the disc space and facet joint ankylosis. Multilevel ventral osteophytes. Soft tissues and spinal canal: No prevertebral fluid or swelling. No visible canal hematoma. Disc levels: Cervical spondylosis with multilevel disc space narrowing, disc bulges/central disc protrusions, posterior disc osteophytes, endplate spurring, uncovertebral hypertrophy and facet arthrosis. No appreciable high-grade spinal canal stenosis. Multilevel bony neural foraminal narrowing. Upper chest: No consolidation within the imaged lung apices. No visible pneumothorax. Other: Calcified plaque within the visualized proximal major branch vessels of the neck and both carotid arteries. IMPRESSION: CT head: 1. No evidence of acute intracranial abnormality. 2. Mild chronic small vessel ischemic changes within the cerebral white matter. 3. Moderate generalized cerebral atrophy. Comparatively mild cerebellar atrophy. CT maxillofacial: 1. No evidence of acute maxillofacial fracture. 2. Mild paranasal sinus disease, as described. CT cervical spine: 1. No evidence of acute fracture to the cervical spine. 2. Mild cervicothoracic  levocurvature. 3. Cervical spondylosis, as described. 4. C4-C5 ankylosis. Electronically Signed   By: Kellie Simmering D.O.   On: 01/02/2021 13:22     Subjective: No complaints  Discharge Exam: Vitals:   01/09/21 2021 01/10/21 0505  BP: 116/66 128/71  Pulse: 85 66  Resp: 17 17  Temp: 98.1 F (36.7 C) 98 F (36.7 C)  SpO2: 99% 98%   Vitals:   01/09/21 0446 01/09/21 1626 01/09/21 2021 01/10/21 0505  BP: 133/67 112/61 116/66 128/71  Pulse: 66 72 85 66  Resp: 18 (!) 21 17 17   Temp: 97.7 F (36.5 C) (!) 97.2 F (36.2 C) 98.1 F (36.7 C) 98 F (36.7 C)  TempSrc: Axillary Axillary Oral Oral  SpO2: 99% 96% 99% 98%  Weight: 59.9 kg   60.4 kg  Height:        General: Pt is alert, awake, not in acute distress Cardiovascular: RRR, S1/S2 +, no rubs, no gallops Respiratory: CTA bilaterally, no wheezing, no rhonchi Abdominal: Soft, NT, ND, bowel sounds + Extremities: no edema, no cyanosis    The results of significant diagnostics from this hospitalization (including imaging, microbiology, ancillary and laboratory) are listed below for reference.     Microbiology: Recent Results (from the past 240 hour(s))  Resp Panel by RT-PCR (Flu A&B, Covid) Nasopharyngeal Swab     Status: None   Collection Time: 01/06/21  2:11 PM   Specimen: Nasopharyngeal Swab; Nasopharyngeal(NP) swabs in vial transport medium  Result Value Ref Range Status   SARS Coronavirus 2 by RT PCR NEGATIVE NEGATIVE Final    Comment: (NOTE) SARS-CoV-2 target nucleic acids are NOT DETECTED.  The SARS-CoV-2 RNA is generally detectable in upper respiratory specimens during the acute phase of infection. The lowest concentration of SARS-CoV-2 viral copies this assay can detect is 138 copies/mL. A negative result does not preclude SARS-Cov-2 infection and should not be used as the sole basis for treatment or other patient management decisions. A negative result may occur with  improper specimen collection/handling,  submission of specimen other than nasopharyngeal swab, presence of viral mutation(s) within the areas targeted by this assay, and inadequate number of viral copies(<138 copies/mL). A negative result must be combined with clinical observations, patient history, and epidemiological information. The expected result is Negative.  Fact Sheet for Patients:  EntrepreneurPulse.com.au  Fact Sheet for Healthcare Providers:  IncredibleEmployment.be  This test is no t yet approved or cleared by the [place] FDA and  has been authorized for detection and/or diagnosis of SARS-CoV-2 by FDA under an Emergency Use Authorization (EUA). This EUA will remain  in effect (  meaning this test can be used) for the duration of the COVID-19 declaration under Section 564(b)(1) of the Act, 21 U.S.C.section 360bbb-3(b)(1), unless the authorization is terminated  or revoked sooner.       Influenza A by PCR NEGATIVE NEGATIVE Final   Influenza B by PCR NEGATIVE NEGATIVE Final    Comment: (NOTE) The Xpert Xpress SARS-CoV-2/FLU/RSV plus assay is intended as an aid in the diagnosis of influenza from Nasopharyngeal swab specimens and should not be used as a sole basis for treatment. Nasal washings and aspirates are unacceptable for Xpert Xpress SARS-CoV-2/FLU/RSV testing.  Fact Sheet for Patients: EntrepreneurPulse.com.au  Fact Sheet for Healthcare Providers: IncredibleEmployment.be  This test is not yet approved or cleared by the [place] FDA and has been authorized for detection and/or diagnosis of SARS-CoV-2 by FDA under an Emergency Use Authorization (EUA). This EUA will remain in effect (meaning this test can be used) for the duration of the COVID-19 declaration under Section 564(b)(1) of the Act, 21 U.S.C. section 360bbb-3(b)(1), unless the authorization is terminated or revoked.  Performed at [facility], [place] [address] [place], [place] [address]   MRSA Next Gen by PCR, Nasal     Status: None   Collection Time: 01/06/21  8:36 PM   Specimen: Nasal Mucosa; Nasal Swab  Result Value Ref Range Status   MRSA by PCR Next Gen NOT DETECTED NOT DETECTED Final    Comment: (NOTE) The GeneXpert MRSA Assay (FDA approved for NASAL specimens only), is one component of a comprehensive MRSA colonization surveillance program. It is not intended to diagnose MRSA infection nor to guide or monitor treatment for MRSA infections. Test performance is not FDA approved in patients less than 67 years old. Performed at [facility], [place] [address] [place], [place] [address]      Labs: BNP (last 3 results) Recent Labs    04/26/20 0628 01/06/21 2240  BNP 92.8 [id number]   Basic Metabolic Panel: Recent Labs  Lab 01/06/21 1433 01/07/21 0207 01/08/21 0311 01/09/21 0430 01/10/21 0131  NA 145 147* 144 138 138  K 3.9 3.4* 3.9 3.9 3.8  CL 108 108 108 103 102  CO2 28 28 28 24 29   GLUCOSE 136* 100* 90 90 92  BUN 13 15 12 10 10   CREATININE 0.93 0.86 0.75 0.69 0.90  CALCIUM 8.7* 8.8* 8.7* 8.5* 8.7*  MG 1.8 1.8  --   --   --   PHOS  --  4.0  --   --   --    Liver Function Tests: No results for input(s): AST, ALT, ALKPHOS, BILITOT, PROT, ALBUMIN in the last 168 hours. No results for input(s): LIPASE, AMYLASE in the last 168 hours. No results for input(s): AMMONIA in the last 168 hours. CBC: Recent Labs  Lab 01/06/21 1433 01/06/21 2240 01/07/21 0207 01/08/21 0311 01/09/21 0430 01/10/21 0131  WBC 9.6 8.8 8.5 7.3 7.8 10.0  NEUTROABS 7.7  --   --   --   --   --   HGB 14.1 13.6 13.9 12.6* 12.1* 12.1*  HCT 44.1 42.0 41.8 37.8* 36.0* 36.4*  MCV 89.6 88.2 88.2 87.7 86.1 87.1  PLT 159 145* 155 144* 158 184   Cardiac Enzymes: No results for input(s): CKTOTAL, CKMB, CKMBINDEX, TROPONINI in the last 168 hours. BNP: Invalid input(s): POCBNP CBG: Recent Labs  Lab 01/06/21 2019  GLUCAP 102*   D-Dimer No  results for input(s): DDIMER in the last 72 hours. Hgb A1c  No results for input(s): HGBA1C in the last 72 hours. Lipid Profile No results for input(s): CHOL, HDL, LDLCALC, TRIG, CHOLHDL, LDLDIRECT in the last 72 hours. Thyroid function studies No results for input(s): TSH, T4TOTAL, T3FREE, THYROIDAB in the last 72 hours.  Invalid input(s): FREET3 Anemia work up No results for input(s): VITAMINB12, FOLATE, FERRITIN, TIBC, IRON, RETICCTPCT in the last 72 hours. Urinalysis    Component Value Date/Time   COLORURINE YELLOW 01/02/2021 1220   APPEARANCEUR HAZY (A) 01/02/2021 1220   LABSPEC 1.023 01/02/2021 1220   PHURINE 5.0 01/02/2021 1220   GLUCOSEU NEGATIVE 01/02/2021 1220   HGBUR NEGATIVE 01/02/2021 1220   BILIRUBINUR NEGATIVE 01/02/2021 1220   KETONESUR 20 (A) 01/02/2021 1220   PROTEINUR 30 (A) 01/02/2021 1220   NITRITE NEGATIVE 01/02/2021 1220   LEUKOCYTESUR NEGATIVE 01/02/2021 1220   Sepsis Labs Invalid input(s): PROCALCITONIN,  WBC,  LACTICIDVEN Microbiology Recent Results (from the past 240 hour(s))  Resp Panel by RT-PCR (Flu A&B, Covid) Nasopharyngeal Swab     Status: None   Collection Time: 01/06/21  2:11 PM   Specimen: Nasopharyngeal Swab; Nasopharyngeal(NP) swabs in vial transport medium  Result Value Ref Range Status   SARS Coronavirus 2 by RT PCR NEGATIVE NEGATIVE Final    Comment: (NOTE) SARS-CoV-2 target nucleic acids are NOT DETECTED.  The SARS-CoV-2 RNA is generally detectable in upper respiratory specimens during the acute phase of infection. The lowest concentration of SARS-CoV-2 viral copies this assay can detect is 138 copies/mL. A negative result does not preclude SARS-Cov-2 infection and should not be used as the sole basis for treatment or other patient management decisions. A negative result may occur with  improper specimen collection/handling, submission of specimen other than nasopharyngeal swab, presence of viral mutation(s) within the areas  targeted by this assay, and inadequate number of viral copies(<138 copies/mL). A negative result must be combined with clinical observations, patient history, and epidemiological information. The expected result is Negative.  Fact Sheet for Patients:  EntrepreneurPulse.com.au  Fact Sheet for Healthcare Providers:  IncredibleEmployment.be  This test is no t yet approved or cleared by the [place] FDA and  has been authorized for detection and/or diagnosis of SARS-CoV-2 by FDA under an Emergency Use Authorization (EUA). This EUA will remain  in effect (meaning this test can be used) for the duration of the COVID-19 declaration under Section 564(b)(1) of the Act, 21 U.S.C.section 360bbb-3(b)(1), unless the authorization is terminated  or revoked sooner.       Influenza A by PCR NEGATIVE NEGATIVE Final   Influenza B by PCR NEGATIVE NEGATIVE Final    Comment: (NOTE) The Xpert Xpress SARS-CoV-2/FLU/RSV plus assay is intended as an aid in the diagnosis of influenza from Nasopharyngeal swab specimens and should not be used as a sole basis for treatment. Nasal washings and aspirates are unacceptable for Xpert Xpress SARS-CoV-2/FLU/RSV testing.  Fact Sheet for Patients: EntrepreneurPulse.com.au  Fact Sheet for Healthcare Providers: IncredibleEmployment.be  This test is not yet approved or cleared by the [place] FDA and has been authorized for detection and/or diagnosis of SARS-CoV-2 by FDA under an Emergency Use Authorization (EUA). This EUA will remain in effect (meaning this test can be used) for the duration of the COVID-19 declaration under Section 564(b)(1) of the Act, 21 U.S.C. section 360bbb-3(b)(1), unless the authorization is terminated or revoked.  Performed at [facility], [place] [address] [place], [place] [address]   MRSA Next Gen by PCR, Nasal     Status: None  Collection Time:  01/06/21  8:36 PM   Specimen: Nasal Mucosa; Nasal Swab  Result Value Ref Range Status   MRSA by PCR Next Gen NOT DETECTED NOT DETECTED Final    Comment: (NOTE) The GeneXpert MRSA Assay (FDA approved for NASAL specimens only), is one component of a comprehensive MRSA colonization surveillance program. It is not intended to diagnose MRSA infection nor to guide or monitor treatment for MRSA infections. Test performance is not FDA approved in patients less than 69 years old. Performed at [facility], [place] [address] [place], [place] [address]       SIGNED:   Charlynne Cousins, MD  [facility] 01/10/2021, 10:47 AM Pager   If 7PM-7AM, please contact night-coverage [email or web address] Password [email or web address]

## 2021-01-15 ENCOUNTER — Emergency Department (HOSPITAL_COMMUNITY): Payer: Medicare Other

## 2021-01-15 ENCOUNTER — Encounter (HOSPITAL_COMMUNITY): Payer: Self-pay

## 2021-01-15 ENCOUNTER — Other Ambulatory Visit: Payer: Self-pay

## 2021-01-15 ENCOUNTER — Inpatient Hospital Stay (HOSPITAL_COMMUNITY)
Admission: EM | Admit: 2021-01-15 | Discharge: 2021-01-17 | DRG: 101 | Disposition: A | Discharge status: Nursing Home (Includes ICF) | Payer: Medicare Other | Source: Skilled Nursing Facility | Attending: Internal Medicine | Admitting: Internal Medicine

## 2021-01-15 ENCOUNTER — Observation Stay (HOSPITAL_COMMUNITY): Payer: Medicare Other

## 2021-01-15 DIAGNOSIS — G309 Alzheimer's disease, unspecified: Secondary | ICD-10-CM | POA: Diagnosis present

## 2021-01-15 DIAGNOSIS — R569 Unspecified convulsions: Secondary | ICD-10-CM | POA: Diagnosis not present

## 2021-01-15 DIAGNOSIS — I2699 Other pulmonary embolism without acute cor pulmonale: Secondary | ICD-10-CM | POA: Diagnosis present

## 2021-01-15 DIAGNOSIS — G4089 Other seizures: Principal | ICD-10-CM | POA: Diagnosis present

## 2021-01-15 DIAGNOSIS — F02B0 Dementia in other diseases classified elsewhere, moderate, without behavioral disturbance, psychotic disturbance, mood disturbance, and anxiety: Secondary | ICD-10-CM | POA: Diagnosis present

## 2021-01-15 DIAGNOSIS — I10 Essential (primary) hypertension: Secondary | ICD-10-CM | POA: Diagnosis present

## 2021-01-15 DIAGNOSIS — Z7901 Long term (current) use of anticoagulants: Secondary | ICD-10-CM

## 2021-01-15 DIAGNOSIS — Z66 Do not resuscitate: Secondary | ICD-10-CM | POA: Diagnosis present

## 2021-01-15 DIAGNOSIS — Z86711 Personal history of pulmonary embolism: Secondary | ICD-10-CM

## 2021-01-15 DIAGNOSIS — Z8616 Personal history of COVID-19: Secondary | ICD-10-CM

## 2021-01-15 DIAGNOSIS — F0280 Dementia in other diseases classified elsewhere without behavioral disturbance: Secondary | ICD-10-CM

## 2021-01-15 DIAGNOSIS — Z79899 Other long term (current) drug therapy: Secondary | ICD-10-CM

## 2021-01-15 DIAGNOSIS — E7800 Pure hypercholesterolemia, unspecified: Secondary | ICD-10-CM | POA: Diagnosis present

## 2021-01-15 LAB — COMPREHENSIVE METABOLIC PANEL
ALT: 31 U/L (ref 0–44)
AST: 23 U/L (ref 15–41)
Albumin: 3.4 g/dL — ABNORMAL LOW (ref 3.5–5.0)
Alkaline Phosphatase: 87 U/L (ref 38–126)
Anion gap: 9 (ref 5–15)
BUN: 8 mg/dL (ref 8–23)
CO2: 25 mmol/L (ref 22–32)
Calcium: 9.3 mg/dL (ref 8.9–10.3)
Chloride: 107 mmol/L (ref 98–111)
Creatinine, Ser: 0.96 mg/dL (ref 0.61–1.24)
GFR, Estimated: >60 mL/min (ref >60)
Glucose, Bld: 102 mg/dL — ABNORMAL HIGH (ref 70–99)
Potassium: 4.1 mmol/L (ref 3.5–5.1)
Sodium: 141 mmol/L (ref 135–145)
Total Bilirubin: 0.5 mg/dL (ref 0.3–1.2)
Total Protein: 6.1 g/dL — ABNORMAL LOW (ref 6.5–8.1)

## 2021-01-15 LAB — I-STAT VENOUS BLOOD GAS, ED
Acid-Base Excess: 5 mmol/L — ABNORMAL HIGH (ref 0.0–2.0)
Bicarbonate: 29.9 mmol/L — ABNORMAL HIGH (ref 20.0–28.0)
Calcium, Ion: 1.2 mmol/L (ref 1.15–1.40)
HCT: 35 % — ABNORMAL LOW (ref 39.0–52.0)
Hemoglobin: 11.9 g/dL — ABNORMAL LOW (ref 13.0–17.0)
O2 Saturation: 99 %
Potassium: 3.7 mmol/L (ref 3.5–5.1)
Sodium: 142 mmol/L (ref 135–145)
TCO2: 31 mmol/L (ref 22–32)
pCO2, Ven: 46.4 mmHg (ref 44.0–60.0)
pH, Ven: 7.417 (ref 7.250–7.430)
pO2, Ven: 117 mmHg — ABNORMAL HIGH (ref 32.0–45.0)

## 2021-01-15 LAB — CBC WITH DIFFERENTIAL/PLATELET
Abs Immature Granulocytes: 0.08 10*3/uL — ABNORMAL HIGH (ref 0.00–0.07)
Basophils Absolute: 0.1 10*3/uL (ref 0.0–0.1)
Basophils Relative: 1 %
Eosinophils Absolute: 0.1 10*3/uL (ref 0.0–0.5)
Eosinophils Relative: 1 %
HCT: 39.5 % (ref 39.0–52.0)
Hemoglobin: 12.7 g/dL — ABNORMAL LOW (ref 13.0–17.0)
Immature Granulocytes: 1 %
Lymphocytes Relative: 10 %
Lymphs Abs: 0.9 10*3/uL (ref 0.7–4.0)
MCH: 28.6 pg (ref 26.0–34.0)
MCHC: 32.2 g/dL (ref 30.0–36.0)
MCV: 89 fL (ref 80.0–100.0)
Monocytes Absolute: 0.5 10*3/uL (ref 0.1–1.0)
Monocytes Relative: 5 %
Neutro Abs: 7.4 10*3/uL (ref 1.7–7.7)
Neutrophils Relative %: 82 %
Platelets: 421 10*3/uL — ABNORMAL HIGH (ref 150–400)
RBC: 4.44 MIL/uL (ref 4.22–5.81)
RDW: 13.6 % (ref 11.5–15.5)
WBC: 8.9 10*3/uL (ref 4.0–10.5)
nRBC: 0 % (ref 0.0–0.2)

## 2021-01-15 LAB — MAGNESIUM: Magnesium: 2.2 mg/dL (ref 1.7–2.4)

## 2021-01-15 MED ORDER — LORAZEPAM 2 MG/ML IJ SOLN
INTRAMUSCULAR | Status: AC
  Administered 2021-01-15: 1 mg via INTRAVENOUS
  Filled 2021-01-15: qty 1

## 2021-01-15 MED ORDER — SODIUM CHLORIDE 0.9 % IV SOLN
75.0000 mL/h | INTRAVENOUS | Status: DC
  Administered 2021-01-15 – 2021-01-17 (×3): 75 mL/h via INTRAVENOUS

## 2021-01-15 MED ORDER — SODIUM CHLORIDE 0.9 % IV SOLN
40.0000 mg/kg | Freq: Once | INTRAVENOUS | Status: AC
  Administered 2021-01-15: 2420 mg via INTRAVENOUS
  Filled 2021-01-15: qty 24.2

## 2021-01-15 MED ORDER — SODIUM CHLORIDE 0.9 % IV SOLN: 60.0000 mg/kg | Freq: Once | INTRAVENOUS | Status: DC

## 2021-01-15 MED ORDER — ORAL CARE MOUTH RINSE
15.0000 mL | OROMUCOSAL | Status: DC
  Administered 2021-01-16 – 2021-01-17 (×4): 15 mL via OROMUCOSAL

## 2021-01-15 MED ORDER — LEVETIRACETAM IN NACL 500 MG/100ML IV SOLN
500.0000 mg | Freq: Two times a day (BID) | INTRAVENOUS | Status: DC
  Administered 2021-01-15 – 2021-01-16 (×2): 500 mg via INTRAVENOUS
  Filled 2021-01-15 (×2): qty 100

## 2021-01-15 MED ORDER — LORAZEPAM 0.5 MG PO TABS: 0.5000 mg | ORAL_TABLET | Freq: Two times a day (BID) | ORAL | Status: DC | PRN

## 2021-01-15 MED ORDER — MIRTAZAPINE 15 MG PO TABS
15.0000 mg | ORAL_TABLET | Freq: Every day | ORAL | Status: DC
  Administered 2021-01-15 – 2021-01-16 (×2): 15 mg via ORAL
  Filled 2021-01-15 (×3): qty 1

## 2021-01-15 MED ORDER — DIPHENHYDRAMINE HCL 50 MG/ML IJ SOLN: 25.0000 mg | Freq: Once | INTRAMUSCULAR | Status: DC

## 2021-01-15 MED ORDER — DONEPEZIL HCL 10 MG PO TABS
10.0000 mg | ORAL_TABLET | Freq: Every day | ORAL | Status: DC
  Administered 2021-01-15 – 2021-01-16 (×2): 10 mg via ORAL
  Filled 2021-01-15 (×3): qty 1

## 2021-01-15 MED ORDER — QUETIAPINE FUMARATE 25 MG PO TABS
25.0000 mg | ORAL_TABLET | Freq: Every day | ORAL | Status: DC
Start: 2021-01-16 — End: 2021-01-18
  Administered 2021-01-16 – 2021-01-17 (×2): 25 mg via ORAL
  Filled 2021-01-15 (×2): qty 1

## 2021-01-15 MED ORDER — APIXABAN 5 MG PO TABS: 10.0000 mg | ORAL_TABLET | Freq: Two times a day (BID) | ORAL | Status: DC

## 2021-01-15 MED ORDER — LORAZEPAM 2 MG/ML IJ SOLN: 1.0000 mg | Freq: Once | INTRAMUSCULAR | Status: DC

## 2021-01-15 MED ORDER — LORAZEPAM 2 MG/ML IJ SOLN: 2.0000 mg | Freq: Once | INTRAMUSCULAR | Status: DC

## 2021-01-15 MED ORDER — LORAZEPAM 2 MG/ML IJ SOLN: 4.0000 mg | INTRAMUSCULAR | Status: DC | PRN

## 2021-01-15 MED ORDER — APIXABAN 5 MG PO TABS: 5.0000 mg | ORAL_TABLET | Freq: Two times a day (BID) | ORAL | Status: DC

## 2021-01-15 MED ORDER — APIXABAN 5 MG PO TABS
10.0000 mg | ORAL_TABLET | Freq: Two times a day (BID) | ORAL | Status: DC
  Administered 2021-01-15 – 2021-01-16 (×2): 10 mg via ORAL
  Filled 2021-01-15 (×2): qty 2

## 2021-01-15 MED ORDER — CHLORHEXIDINE GLUCONATE 0.12% ORAL RINSE (MEDLINE KIT)
15.0000 mL | Freq: Two times a day (BID) | OROMUCOSAL | Status: DC
  Administered 2021-01-16: 15 mL via OROMUCOSAL

## 2021-01-15 MED ORDER — HALOPERIDOL LACTATE 5 MG/ML IJ SOLN: 5.0000 mg | Freq: Once | INTRAMUSCULAR | Status: DC

## 2021-01-15 MED ORDER — LACTATED RINGERS IV BOLUS
1000.0000 mL | Freq: Once | INTRAVENOUS | Status: AC
  Administered 2021-01-15: 1000 mL via INTRAVENOUS

## 2021-01-15 MED ORDER — LORAZEPAM 2 MG/ML IJ SOLN: 1.0000 mg | Freq: Once | INTRAMUSCULAR | Status: AC

## 2021-01-15 MED ORDER — SERTRALINE HCL 50 MG PO TABS
25.0000 mg | ORAL_TABLET | Freq: Every day | ORAL | Status: DC
  Administered 2021-01-16 – 2021-01-17 (×2): 25 mg via ORAL
  Filled 2021-01-15 (×2): qty 1

## 2021-01-15 NOTE — Consult Note (Signed)
NEUROLOGY CONSULTATION NOTE   Date of service: January 15, 2021 Patient Name: Jonathan Berry MRN:  [id number] DOB:  1945-11-04 Reason for consult: possible seizure Requesting physician: Dr. 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  Patient has advanced dementia at baseline and wife states that he would be unable to provide history under normal circumstances and would typically only be oriented to self and possibly location.  Wife states that patient was at his facility and went to the hairdresser and when he was placed on the chair with his head lowered he started to have an episode during which his arms stiffened, his face turned pale, and he was breathing heavily.  He was unresponsive during the episode and lethargic afterwards.  No tongue biting or urinary incontinence.  After arrival to the ED while undergoing CT scan patient had another episode where his whole body stiffened and then he had generalized convulsions which aborted with 1 mg of IV Ativan.  On my examination he can tell me his first name and perseverates on the word okay.  He is not consistently following commands.  He is fidgeting with his hands in a somewhat stereotyped way that his wife states is baseline.  MRI brain without contrast showed no acute intracranial abnormality, no evidence of acute infarct, no definite seizure etiology (personal review)  He was loaded on Keppra in the emergency department and started on 500 mg twice daily.  No prior history of seizures   ROS   UTA 2/2 dementia  Past History   I have reviewed the following:  Past Medical History:  Diagnosis Date   Alzheimer disease ([place])    COVID    Hypercholesteremia    Hypertension    Past Surgical History:  Procedure Laterality  Date   KNEE SURGERY     MANDIBLE SURGERY     Family History  Problem Relation Age of Onset   Diabetes Mellitus II Neg Hx    Social History   Socioeconomic History   Marital status: Married    Spouse name: Not on file   Number of children: Not on file   Years of education: Not on file   Highest education level: Not on file  Occupational History   Not on file  Tobacco Use   Smoking status: Never   Smokeless tobacco: Never  Substance and Sexual Activity   Alcohol use: Yes    Comment: 3 glasses of wine a week   Drug use: Never   Sexual activity: Not on file  Other Topics Concern   Not on file  Social History Narrative   Not on file   Social Determinants of Health   Financial Resource Strain: Not on file  Food Insecurity: Not on file  Transportation Needs: Not on file  Physical Activity: Not on file  Stress: Not on file  Social Connections: Not on file   No Known Allergies  Medications   (Not in a hospital admission)     Current Facility-Administered Medications:    0.9 %  sodium chloride infusion, 75 mL/hr, Intravenous, Continuous, Zhang, Pearletha Forge T, MD   apixaban (ELIQUIS) tablet 10 mg, 10 mg, Oral, BID **FOLLOWED BY** [START ON 01/17/2021] apixaban (ELIQUIS)  tablet 5 mg, 5 mg, Oral, BID, Wynetta Fines T, MD   chlorhexidine gluconate (MEDLINE KIT) (PERIDEX) 0.12 % solution 15 mL, 15 mL, Mouth Rinse, BID, Zhang, Ping T, MD   donepezil (ARICEPT) tablet 10 mg, 10 mg, Oral, QHS, Zhang, Ping T, MD   levETIRAcetam (KEPPRA) IVPB 500 mg/100 mL premix, 500 mg, Intravenous, Q12H, Godfrey Pick, MD   LORazepam (ATIVAN) injection 4 mg, 4 mg, Intravenous, Q5 Min x 2 PRN, Wynetta Fines T, MD   LORazepam (ATIVAN) tablet 0.5 mg, 0.5 mg, Oral, BID PRN, Wynetta Fines T, MD   MEDLINE mouth rinse, 15 mL, Mouth Rinse, 10 times per day, Wynetta Fines T, MD   mirtazapine (REMERON) tablet 15 mg, 15 mg, Oral, QHS, Wynetta Fines T, MD   [START ON 01/16/2021] QUEtiapine (SEROQUEL) tablet 25 mg, 25 mg, Oral,  Daily, Wynetta Fines T, MD   [START ON 01/16/2021] sertraline (ZOLOFT) tablet 25 mg, 25 mg, Oral, Daily, Wynetta Fines T, MD  Current Outpatient Medications:    APIXABAN (ELIQUIS) VTE STARTER PACK (10MG AND 5MG), Take as directed on package: start with two-52m tablets twice daily for 7 days. On day 8, switch to one-518mtablet twice daily., Disp: 1 each, Rfl: 0   donepezil (ARICEPT) 10 MG tablet, Take 10 mg by mouth at bedtime., Disp: , Rfl:    LORazepam (ATIVAN) 1 MG tablet, Take 0.5 tablets (0.5 mg total) by mouth 2 (two) times daily as needed for anxiety., Disp: 5 tablet, Rfl: 0   mirtazapine (REMERON) 15 MG tablet, Take 15 mg by mouth at bedtime., Disp: , Rfl:    QUEtiapine (SEROQUEL) 25 MG tablet, Take 25 mg by mouth daily., Disp: , Rfl:    sertraline (ZOLOFT) 25 MG tablet, Take 25 mg by mouth daily., Disp: , Rfl:   Vitals   Vitals:   01/15/21 1430 01/15/21 1500 01/15/21 1530 01/15/21 1600  BP: (!) 141/80 (!) 123/101 110/64 95/60  Pulse: (!) 105 82 73 89  Resp: 18 (!) 21 18 14   Temp:      TempSrc:      SpO2: 95% 97% 98% 97%     There is no height or weight on file to calculate BMI.  Physical Exam   Physical Exam Gen: alert, oriented to first name only, does not consistently follow commands Resp: CTAB, no w/r/r CV: RRR, no m/g/r; nml S1 and S2. 2+ symmetric peripheral pulses.  Neuro: *MS: alert, oriented to first name only, does not consistently follow commands *Speech: moderate dysarthria, UTA aphasia *CN: PERRL, blinks to threat bilat, tracks examiner with apparent full EOM, face symmetric at rest, hearing intact to voice *Motor: 5/5 strength throughout when resisting examiner or trying to get out of bed Sensory: SILT Reflexes: 2+ symmetric throughout, toes w/d only bilat Coordination, gait: UTA  Labs   CBC:  Recent Labs  Lab 01/10/21 0131 01/15/21 1345  WBC 10.0 8.9  NEUTROABS  --  7.4  HGB 12.1* 12.7*  HCT 36.4* 39.5  MCV 87.1 89.0  PLT 184 421*    Basic  Metabolic Panel:  Lab Results  Component Value Date   NA 141 01/15/2021   K 4.1 01/15/2021   CO2 25 01/15/2021   GLUCOSE 102 (H) 01/15/2021   BUN 8 01/15/2021   CREATININE 0.96 01/15/2021   CALCIUM 9.3 01/15/2021   GFRNONAA >60 01/15/2021   Lipid Panel: No results found for: LDLCALC HgbA1c: No results found for: HGBA1C Urine Drug Screen: No results found for: LABOPIA, COCAINSCRNUR, LABBENZ, AMPHETMU,  THCU, LABBARB  Alcohol Level No results found for: ETH   Impression   This is a 76 year old gentleman with a past medical history of 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  MRI brain w/o etiology for seizures. No clear precipitating factor. Patient was on v low dose ativan 0.74m bid at facility which was discontinued today, thjis is felt to be unrelated.  Recommendations   - Continue keppra 5030mbid - rEEG pending - Seizure precautions - Will continue to follow ______________________________________________________________________   Thank you for the opportunity to take part in the care of this patient. If you have any further questions, please contact the neurology consultation attending.  Signed,  CoSu MonksMD [facility] [phone] 7pm- 7am, please page neurology on call as listed in [place]

## 2021-01-15 NOTE — ED Provider Notes (Signed)
[facility] EMERGENCY DEPARTMENT Provider Note   CSN: [id number] Arrival date & time: 01/15/21  1234     History  No chief complaint on file.   Jonathan Berry is a 76 y.o. male.  HPI Patient presents from the memory care unit of assisted living facility for concern of seizure.  He has no known history of seizures.  He did have recent hospitalization for PE.  He was started on Eliquis, which she has continued to take.  He has not had any recent known traumas to his head.  This morning, he was being given a bath.  During this, while they were washing his hair, he had what was described as a tonic-clonic seizure.  They describe it as both arms extending out in front of him with tonic-clonic convulsions, and foaming at the mouth.  Following this episode, he seemed confused.  His family was on scene when EMS arrived.  They stated that he is not at his mental baseline.  Patient's blood sugar was checked and found to be normal.  Vital signs during transit were unremarkable.  Patient currently does not endorse any areas of discomfort, including headache.     Home Medications Prior to Admission medications   Medication Sig Start Date End Date Taking? Authorizing Provider  APIXABAN (ELIQUIS) VTE STARTER PACK (10MG AND 5MG) Take as directed on package: start with two-16m tablets twice daily for 7 days. On day 8, switch to one-562mtablet twice daily. 01/10/21  Yes FeCharlynne CousinsMD  donepezil (ARICEPT) 10 MG tablet Take 10 mg by mouth at bedtime.   Yes [provider]  LORazepam (ATIVAN) 1 MG tablet Take 0.5 tablets (0.5 mg total) by mouth 2 (two) times daily as needed for anxiety. 01/10/21  Yes FeCharlynne CousinsMD  mirtazapine (REMERON) 15 MG tablet Take 15 mg by mouth at bedtime.   Yes [provider]  QUEtiapine (SEROQUEL) 25 MG tablet Take 25 mg by mouth daily.   Yes [provider]  sertraline (ZOLOFT) 25 MG tablet Take 25 mg by mouth daily.    Yes [provider]      Allergies    Patient has no known allergies.    Review of Systems   Review of Systems  Unable to perform ROS: Dementia   Physical Exam Updated Vital Signs BP 123/68    Pulse 77    Temp 98.1 F (36.7 C) (Oral)    Resp 15    SpO2 91%  Physical Exam Vitals and nursing note reviewed.  Constitutional:      General: He is not in acute distress.    Appearance: He is well-developed and normal weight. He is not ill-appearing, toxic-appearing or diaphoretic.  HENT:     Head: Normocephalic and atraumatic.     Right Ear: External ear normal.     Left Ear: External ear normal.     Nose: Nose normal.     Mouth/Throat:     Mouth: Mucous membranes are moist.  Eyes:     Extraocular Movements: Extraocular movements intact.     Conjunctiva/sclera: Conjunctivae normal.  Cardiovascular:     Rate and Rhythm: Normal rate and regular rhythm.     Heart sounds: No murmur heard. Pulmonary:     Effort: Pulmonary effort is normal. No respiratory distress.     Breath sounds: Normal breath sounds. No wheezing or rales.  Chest:     Chest wall: No tenderness.  Abdominal:  Palpations: Abdomen is soft.     Tenderness: There is no abdominal tenderness.  Musculoskeletal:        General: No swelling. Normal range of motion.     Cervical back: Normal range of motion and neck supple.     Right lower leg: No edema.     Left lower leg: No edema.  Skin:    General: Skin is warm and dry.     Capillary Refill: Capillary refill takes less than 2 seconds.     Coloration: Skin is not jaundiced or pale.  Neurological:     Mental Status: He is alert.     GCS: GCS eye subscore is 4. GCS verbal subscore is 4. GCS motor subscore is 5.     Cranial Nerves: No facial asymmetry.     Motor: No abnormal muscle tone.    ED Results / Procedures / Treatments   Labs (all labs ordered are listed, but only abnormal results are displayed) Labs Reviewed  COMPREHENSIVE METABOLIC PANEL -  Abnormal; Notable for the following components:      Result Value   Glucose, Bld 102 (*)    Total Protein 6.1 (*)    Albumin 3.4 (*)    All other components within normal limits  CBC WITH DIFFERENTIAL/PLATELET - Abnormal; Notable for the following components:   Hemoglobin 12.7 (*)    Platelets 421 (*)    Abs Immature Granulocytes 0.08 (*)    All other components within normal limits  MAGNESIUM  URINALYSIS, ROUTINE W REFLEX MICROSCOPIC  RAPID URINE DRUG SCREEN, HOSP PERFORMED  BLOOD GAS, VENOUS    EKG EKG Interpretation  Date/Time:  Tuesday January 15 2021 12:48:20 EST Ventricular Rate:  91 PR Interval:    QRS Duration: 85 QT Interval:  366 QTC Calculation: 451 R Axis:   -13 Text Interpretation: Atrial flutter with varied AV block, Anteroseptal infarct, age indeterminate Confirmed by Godfrey Pick [phone]) on 01/15/2021 1:06:38 PM  Radiology CT HEAD WO CONTRAST (5MM)  Result Date: 01/15/2021 CLINICAL DATA:  Provided history: Seizure, new onset, no history of trauma; mental status change, unknown cause. EXAM: CT HEAD WITHOUT CONTRAST TECHNIQUE: Contiguous axial images were obtained from the base of the skull through the vertex without intravenous contrast. COMPARISON:  CT head/cervical spine 01/02/2021. FINDINGS: Brain: Moderate generalized cerebral atrophy. Comparatively mild cerebellar atrophy. Mild patchy and ill-defined hypoattenuation within the cerebral white matter, nonspecific but compatible with chronic small vessel ischemic disease. There is no acute intracranial hemorrhage. No demarcated cortical infarct. No extra-axial fluid collection. No evidence of an intracranial mass. No midline shift. Vascular: No hyperdense vessel. Atherosclerotic calcifications. Skull: Normal. Negative for fracture or focal lesion. Sinuses/Orbits: Visualized orbits show no acute finding. Tiny mucous retention cyst within the right maxillary sinus. Trace mucosal thickening within the bilateral sphenoid  sinuses. Minimal scattered mucosal thickening and fluid within the bilateral ethmoid sinuses. IMPRESSION: No evidence of acute intracranial abnormality. Mild chronic small vessel ischemic changes within the cerebral white matter. Moderate generalized cerebral atrophy. Comparatively mild cerebellar atrophy. Mild paranasal sinus disease, as described. Electronically Signed   By: Kellie Simmering D.O.   On: 01/15/2021 14:06   MR BRAIN WO CONTRAST  Result Date: 01/15/2021 CLINICAL DATA:  Seizure, new onset EXAM: MRI HEAD WITHOUT CONTRAST TECHNIQUE: Multiplanar, multiecho pulse sequences of the brain and surrounding structures were obtained without intravenous contrast. COMPARISON:  No prior MRI, correlation is made with CT head 01/15/2021 and 01/02/2021 FINDINGS: Brain: Restricted diffusion to suggest acute or subacute  infarct. No acute hemorrhage, mass, mass effect, or midline shift. No hydrocephalus or extra-axial collection. Degree of ventricular enlargement is commensurate with sulcal size, with advanced cerebral atrophy for age. No lobar predominance to the volume loss. 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 Grossly symmetric hippocampi, which are atrophied to a similar degree as the rest of the brain. The hippocampi are grossly normal in signal. No heterotopia or focal cortical dysplasia. Vascular: Normal flow voids. Skull and upper cervical spine: Normal marrow signal. Sinuses/Orbits: No significant mucosal thickening. The orbits are unremarkable. Other: None. IMPRESSION: No acute intracranial process. No definite seizure etiology identified. Electronically Signed   By: Merilyn Baba M.D.   On: 01/15/2021 18:13   DG Chest Port 1 View  Result Date: 01/15/2021 CLINICAL DATA:  Altered mental status EXAM: PORTABLE CHEST 1 VIEW COMPARISON:  Chest x-ray dated December twenty-fifth 2022 FINDINGS: Cardiac and mediastinal contours are  unchanged within normal limits. Unchanged biapical pleuroparenchymal scarring and mild apical pleural calcifications. Mild bibasilar atelectasis. No large pleural effusion. No evidence of pneumothorax. IMPRESSION: No acute cardiopulmonary abnormality. Electronically Signed   By: Yetta Glassman M.D.   On: 01/15/2021 13:12    Procedures Procedures    Medications Ordered in ED Medications  levETIRAcetam (KEPPRA) IVPB 500 mg/100 mL premix (has no administration in time range)  donepezil (ARICEPT) tablet 10 mg (has no administration in time range)  LORazepam (ATIVAN) tablet 0.5 mg (has no administration in time range)  mirtazapine (REMERON) tablet 15 mg (has no administration in time range)  QUEtiapine (SEROQUEL) tablet 25 mg (has no administration in time range)  sertraline (ZOLOFT) tablet 25 mg (has no administration in time range)  chlorhexidine gluconate (MEDLINE KIT) (PERIDEX) 0.12 % solution 15 mL (15 mLs Mouth Rinse Not Given 01/15/21 1916)  MEDLINE mouth rinse (15 mLs Mouth Rinse Not Given 01/15/21 1916)  0.9 %  sodium chloride infusion (75 mL/hr Intravenous New Bag/Given 01/15/21 2022)  LORazepam (ATIVAN) injection 4 mg (has no administration in time range)  apixaban (ELIQUIS) tablet 10 mg (10 mg Oral Given 01/15/21 2022)    Followed by  apixaban (ELIQUIS) tablet 5 mg (has no administration in time range)  lactated ringers bolus 1,000 mL (0 mLs Intravenous Stopped 01/15/21 1504)  levETIRAcetam (KEPPRA) 2,420 mg in sodium chloride 0.9 % 250 mL IVPB (0 mg Intravenous Stopped 01/15/21 1700)  LORazepam (ATIVAN) injection 1 mg (1 mg Intravenous Given 01/15/21 1434)    ED Course/ Medical Decision Making/ A&P                           Medical Decision Making  Patient is a 76 year old male with history of dementia, who presents after a seizure.  He has no known history of seizures.  Seizure occurred while he was getting his hair washed.  It was described as tonic-clonic and 2 minutes in duration.   Patient was noted to be confused afterward.  EMS reports continued confusion during transit.  He is on Eliquis.  He has not had any known traumas.  On arrival, patient is awake but is clearly altered.  He cannot fully follow commands or participate in a neurologic exam.  No focal deficits are appreciated on limited exam.  He has no evidence of trauma on exam.  Work-up was initiated to identify possible underlying cause of this new onset seizure.  While patient was undergoing CT scan, he had  a second witnessed tonic-clonic seizure.  During this episode, his breathing was assisted with BVM.  Seizure resolved without any medications.  Following seizure, he was given 1 mg of Ativan.  On return to his room, his wife was present at bedside.  I did inform his wife of this second seizure episode.  His wife was able to give me additional history.  She confirmed that patient has not had any known seizures prior to today.  She also confirmed that patient ha had not come back to his mental baseline prior to the seizure in the CT scanner.  This raises concern for status epilepticus.  Patient's wife also noticed that the patient did seem to have less energy when she visited him yesterday.  She spoke with the staff at his nursing facility about his use of Ativan.  Per chart review, patient is prescribed 0.5 mg of Ativan twice daily.  It is unknown if staff at his facility decreased/discontinued his dose.  Given the dosing and the timeline, seizures are unlikely to be secondary from Ativan withdrawal.  I spoke with neurology who recommended Keppra loading dose of 40 mg/kg.  This was ordered.  They also recommended admission for further seizure work-up.  Patient was admitted to hospitalist for further management.  CRITICAL CARE Performed by: Godfrey Pick   Total critical care time: 40 minutes  Critical care time was exclusive of separately billable procedures and treating other patients.  Critical care was necessary to treat  or prevent imminent or life-threatening deterioration.  Critical care was time spent personally by me on the following activities: development of treatment plan with patient and/or surrogate as well as nursing, discussions with consultants, evaluation of patient's response to treatment, examination of patient, obtaining history from patient or surrogate, ordering and performing treatments and interventions, ordering and review of laboratory studies, ordering and review of radiographic studies, pulse oximetry and re-evaluation of patient's condition.         Final Clinical Impression(s) / ED Diagnoses Final diagnoses:  Seizure [facility])    Rx / [place] Orders ED Discharge Orders     None         Godfrey Pick, MD 01/15/21 2101

## 2021-01-15 NOTE — ED Notes (Signed)
Patient transported to MRI 

## 2021-01-15 NOTE — H&P (Addendum)
History and Physical    Jonathan QuanCarl Berry QIO:[id number] DOB: 12-Jun-1945 DOA: 01/15/2021  PCP: Collene MaresMiller, Virginia E, PA (Confirm with patient/family/NH records and if not entered, [facility]) Patient coming from: Nursing home  I have personally briefly [facility]  Chief Complaint: Leave me alone  HPI: Jonathan QuanCarl Berry is a 76 y.o. male with medical history significant of recent dark-colored PE status post tPA and on Eliquis, advanced dementia, HTN, HLD, presented with possible seizure activity x2.  Patient demented unable to provide history, all history provided by patient wife at bedside.  Wife reported that that today patient was at her dresser for the first time while barber tried to wash his hair and patient was placed on the chair with head lowered, the patient started to have " arms became stiff, face turning rash first and pale, breathing heavily" and unresponsive for about 30 seconds.  Denies any loss of urine or bowel movement, might have some tongue biting but not sure.  Vitals are improving patient was starting taking as needed Ativan for anxiety, last dose probably was last night but not sure.  A CAT scan, patient was witnessed to have another episode where the patient was breathing heavily and then became cyanotic, whole-body stiff and shaking and patient was given 1 mg of IV Ativan and episode broke.  Patient was awake, when I saw him, does not remember any appointment.  CT head negative for acute findings, patient was started on Keppra loading in the ED.  Review of Systems: Unable to perform, baseline demented.   Past Medical History:  Diagnosis Date   Alzheimer disease (HCC)    COVID    Hypercholesteremia    Hypertension     Past Surgical History:  Procedure Laterality Date   KNEE SURGERY     MANDIBLE SURGERY       reports that he has never smoked. He has never used smokeless tobacco. He reports  current alcohol use. He reports that he does not use drugs.  No Known Allergies  Family History  Problem Relation Age of Onset   Diabetes Mellitus II Neg Hx      Prior to Admission medications   Medication Sig Start Date End Date Taking? Authorizing Provider  APIXABAN (ELIQUIS) VTE STARTER PACK (10MG  AND 5MG ) Take as directed on package: start with two-5mg  tablets twice daily for 7 days. On day 8, switch to one-5mg  tablet twice daily. 01/10/21   Marinda ElkFeliz Ortiz, Abraham, MD  donepezil (ARICEPT) 10 MG tablet Take 10 mg by mouth at bedtime.    [provider]  LORazepam (ATIVAN) 1 MG tablet Take 0.5 tablets (0.5 mg total) by mouth 2 (two) times daily as needed for anxiety. 01/10/21   Marinda ElkFeliz Ortiz, Abraham, MD  mirtazapine (REMERON) 15 MG tablet Take 15 mg by mouth at bedtime.    [provider]  QUEtiapine (SEROQUEL) 25 MG tablet Take 25 mg by mouth daily.    [provider]  sertraline (ZOLOFT) 25 MG tablet Take 25 mg by mouth daily.    [provider]    Physical Exam: Vitals:   01/15/21 1248 01/15/21 1315 01/15/21 1430 01/15/21 1500  BP: (!) 116/59 (!) 94/57 (!) 141/80 (!) 123/101  Pulse: 87 66 (!) 105 82  Resp: 17  18 (!) 21  Temp: 98.1 F (36.7 C)     TempSrc: Oral     SpO2: 99% 97% 95% 97%  Constitutional: NAD, calm, comfortable Vitals:   01/15/21 1248 01/15/21 1315 01/15/21 1430 01/15/21 1500  BP: (!) 116/59 (!) 94/57 (!) 141/80 (!) 123/101  Pulse: 87 66 (!) 105 82  Resp: 17  18 (!) 21  Temp: 98.1 F (36.7 C)     TempSrc: Oral     SpO2: 99% 97% 95% 97%   Eyes: PERRL, lids and conjunctivae normal ENMT: Mucous membranes are moist. Posterior pharynx clear of any exudate or lesions.Normal dentition.  Neck: normal, supple, no masses, no thyromegaly Respiratory: clear to auscultation bilaterally, no wheezing, no crackles. Normal respiratory effort. No accessory muscle use.  Cardiovascular: Regular rate and rhythm, no murmurs / rubs /  gallops. No extremity edema. 2+ pedal pulses. No carotid bruits.  Abdomen: no tenderness, no masses palpated. No hepatosplenomegaly. Bowel sounds positive.  Musculoskeletal: no clubbing / cyanosis. No joint deformity upper and lower extremities. Good ROM, no contractures. Normal muscle tone.  Skin: no rashes, lesions, ulcers. No induration Neurologic: Baseline tremors, no facial droops, moving all limbs, not following command. Psychiatric: Awake, demented  Labs on Admission: I have personally reviewed following labs and imaging studies  CBC: Recent Labs  Lab 01/09/21 0430 01/10/21 0131 01/15/21 1345  WBC 7.8 10.0 8.9  NEUTROABS  --   --  7.4  HGB 12.1* 12.1* 12.7*  HCT 36.0* 36.4* 39.5  MCV 86.1 87.1 89.0  PLT 158 184 421*   Basic Metabolic Panel: Recent Labs  Lab 01/09/21 0430 01/10/21 0131 01/15/21 1345  NA 138 138 141  K 3.9 3.8 4.1  CL 103 102 107  CO2 24 29 25   GLUCOSE 90 92 102*  BUN 10 10 8   CREATININE 0.69 0.90 0.96  CALCIUM 8.5* 8.7* 9.3  MG  --   --  2.2   GFR: Estimated Creatinine Clearance: 56.8 mL/min (by C-G formula based on SCr of 0.96 mg/dL). Liver Function Tests: Recent Labs  Lab 01/15/21 1345  AST 23  ALT 31  ALKPHOS 87  BILITOT 0.5  PROT 6.1*  ALBUMIN 3.4*   No results for input(s): LIPASE, AMYLASE in the last 168 hours. No results for input(s): AMMONIA in the last 168 hours. Coagulation Profile: No results for input(s): INR, PROTIME in the last 168 hours. Cardiac Enzymes: No results for input(s): CKTOTAL, CKMB, CKMBINDEX, TROPONINI in the last 168 hours. BNP (last 3 results) No results for input(s): PROBNP in the last 8760 hours. HbA1C: No results for input(s): HGBA1C in the last 72 hours. CBG: No results for input(s): GLUCAP in the last 168 hours. Lipid Profile: No results for input(s): CHOL, HDL, LDLCALC, TRIG, CHOLHDL, LDLDIRECT in the last 72 hours. Thyroid Function Tests: No results for input(s): TSH, T4TOTAL, FREET4, T3FREE,  THYROIDAB in the last 72 hours. Anemia Panel: No results for input(s): VITAMINB12, FOLATE, FERRITIN, TIBC, IRON, RETICCTPCT in the last 72 hours. Urine analysis:    Component Value Date/Time   COLORURINE YELLOW 01/02/2021 1220   APPEARANCEUR HAZY (A) 01/02/2021 1220   LABSPEC 1.023 01/02/2021 1220   PHURINE 5.0 01/02/2021 1220   GLUCOSEU NEGATIVE 01/02/2021 1220   HGBUR NEGATIVE 01/02/2021 1220   BILIRUBINUR NEGATIVE 01/02/2021 1220   KETONESUR 20 (A) 01/02/2021 1220   PROTEINUR 30 (A) 01/02/2021 1220   NITRITE NEGATIVE 01/02/2021 1220   LEUKOCYTESUR NEGATIVE 01/02/2021 1220    Radiological Exams on Admission: CT HEAD WO CONTRAST (01/04/2021)  Result Date: 01/15/2021 CLINICAL DATA:  Provided history: Seizure, new onset, no history of trauma; mental status change, unknown cause. EXAM:  CT HEAD WITHOUT CONTRAST TECHNIQUE: Contiguous axial images were obtained from the base of the skull through the vertex without intravenous contrast. COMPARISON:  CT head/cervical spine 01/02/2021. FINDINGS: Brain: Moderate generalized cerebral atrophy. Comparatively mild cerebellar atrophy. Mild patchy and ill-defined hypoattenuation within the cerebral white matter, nonspecific but compatible with chronic small vessel ischemic disease. There is no acute intracranial hemorrhage. No demarcated cortical infarct. No extra-axial fluid collection. No evidence of an intracranial mass. No midline shift. Vascular: No hyperdense vessel. Atherosclerotic calcifications. Skull: Normal. Negative for fracture or focal lesion. Sinuses/Orbits: Visualized orbits show no acute finding. Tiny mucous retention cyst within the right maxillary sinus. Trace mucosal thickening within the bilateral sphenoid sinuses. Minimal scattered mucosal thickening and fluid within the bilateral ethmoid sinuses. IMPRESSION: No evidence of acute intracranial abnormality. Mild chronic small vessel ischemic changes within the cerebral white matter. Moderate  generalized cerebral atrophy. Comparatively mild cerebellar atrophy. Mild paranasal sinus disease, as described. Electronically Signed   By: Jackey Loge D.O.   On: 01/15/2021 14:06   DG Chest Port 1 View  Result Date: 01/15/2021 CLINICAL DATA:  Altered mental status EXAM: PORTABLE CHEST 1 VIEW COMPARISON:  Chest x-ray dated December twenty-fifth 2022 FINDINGS: Cardiac and mediastinal contours are unchanged within normal limits. Unchanged biapical pleuroparenchymal scarring and mild apical pleural calcifications. Mild bibasilar atelectasis. No large pleural effusion. No evidence of pneumothorax. IMPRESSION: No acute cardiopulmonary abnormality. Electronically Signed   By: Allegra Lai M.D.   On: 01/15/2021 13:12    EKG: Independently reviewed.  Sinus, no acute ST changes  Assessment/Plan Principal Problem:   Seizure (HCC)  (please populate well all problems here in Problem List. (For example, if patient is on BP meds at home and you resume or decide to hold them, it is a problem that needs to be her. Same for CAD, COPD, HLD and so on)  Seizure versus syncope -First episode at hairdresser appears to be a syncope episode with the happening correlated with patient had was lowered down and turned to the side.  But the second episode was CT scan appears to be seizure episode. -Neurology on board, Keppra loading, ordered MRI brain for tomorrow once patient more stable. -For syncope work-up, will check orthostatic vital signs.  Patient had echo on last admission, patient also saturation stable, unless orthostatic vital signs is of significance, will not repeat echo. As he has been on full dose of anticoagulation for at least 7 days, risk of obstructive hypotension from the PE is low.  Discussed with patient wife at bedside. -Other differential, withdrawal seizure from Ativan less likely given pharmacodynamic patient received last dose last night.  PE -Continue Eliquis loading, switch to 5 mg twice  daily on 1/5  Dementia -Stable, continue Aricept  DVT prophylaxis: Eliquis Code Status: DNR Family Communication: Wife at bedside Disposition Plan: Expect less than 2 midnight hospital stay Consults called: Neurology Admission status: Telemetry observation   Emeline General MD [facility] Pager [phone]  01/15/2021, 4:18 PM

## 2021-01-15 NOTE — ED Triage Notes (Signed)
Per EMS pt had PE on eliquis. Pt had a seizure that lasted 2 minutes.   BP 142/86 108 HR 18 RR 98 oxygen RA CBG 124

## 2021-01-15 NOTE — ED Notes (Signed)
Pt had a witnessed seizure while in CT- this RN witnessed full body stiffening, blowing resp, pt became cyanotic, given blow by O2, suctioned orally. Dr. Wallace Cullens and Dr. Durwin Nora at bedside- Leota Sauers, RN and Myriam Jacobson, RN at bedside- Primary RN to room and transported pt back to rm 29.   Anell Barr, RN Trauma Response Nurse [phone]

## 2021-01-16 ENCOUNTER — Inpatient Hospital Stay (HOSPITAL_COMMUNITY): Payer: Medicare Other

## 2021-01-16 ENCOUNTER — Observation Stay (HOSPITAL_COMMUNITY): Payer: Medicare Other

## 2021-01-16 DIAGNOSIS — G4089 Other seizures: Secondary | ICD-10-CM | POA: Diagnosis present

## 2021-01-16 DIAGNOSIS — R569 Unspecified convulsions: Secondary | ICD-10-CM | POA: Diagnosis present

## 2021-01-16 DIAGNOSIS — I2602 Saddle embolus of pulmonary artery with acute cor pulmonale: Secondary | ICD-10-CM | POA: Diagnosis not present

## 2021-01-16 DIAGNOSIS — Z86711 Personal history of pulmonary embolism: Secondary | ICD-10-CM | POA: Diagnosis not present

## 2021-01-16 DIAGNOSIS — F0280 Dementia in other diseases classified elsewhere without behavioral disturbance: Secondary | ICD-10-CM

## 2021-01-16 DIAGNOSIS — I10 Essential (primary) hypertension: Secondary | ICD-10-CM | POA: Diagnosis present

## 2021-01-16 DIAGNOSIS — G309 Alzheimer's disease, unspecified: Secondary | ICD-10-CM | POA: Diagnosis present

## 2021-01-16 DIAGNOSIS — Z66 Do not resuscitate: Secondary | ICD-10-CM | POA: Diagnosis present

## 2021-01-16 DIAGNOSIS — Z79899 Other long term (current) drug therapy: Secondary | ICD-10-CM | POA: Diagnosis not present

## 2021-01-16 DIAGNOSIS — Z8616 Personal history of COVID-19: Secondary | ICD-10-CM | POA: Diagnosis not present

## 2021-01-16 DIAGNOSIS — E7800 Pure hypercholesterolemia, unspecified: Secondary | ICD-10-CM | POA: Diagnosis present

## 2021-01-16 DIAGNOSIS — F02B0 Dementia in other diseases classified elsewhere, moderate, without behavioral disturbance, psychotic disturbance, mood disturbance, and anxiety: Secondary | ICD-10-CM | POA: Diagnosis present

## 2021-01-16 DIAGNOSIS — Z7901 Long term (current) use of anticoagulants: Secondary | ICD-10-CM | POA: Diagnosis not present

## 2021-01-16 LAB — RAPID URINE DRUG SCREEN, HOSP PERFORMED
Amphetamines: NOT DETECTED
Barbiturates: NOT DETECTED
Benzodiazepines: NOT DETECTED
Cocaine: NOT DETECTED
Opiates: NOT DETECTED
Tetrahydrocannabinol: NOT DETECTED

## 2021-01-16 LAB — RESP PANEL BY RT-PCR (FLU A&B, COVID) ARPGX2
Influenza A by PCR: NEGATIVE
Influenza B by PCR: NEGATIVE
SARS Coronavirus 2 by RT PCR: NEGATIVE

## 2021-01-16 LAB — URINALYSIS, MICROSCOPIC (REFLEX)

## 2021-01-16 LAB — URINALYSIS, ROUTINE W REFLEX MICROSCOPIC
Bilirubin Urine: NEGATIVE
Glucose, UA: NEGATIVE mg/dL
Ketones, ur: NEGATIVE mg/dL
Leukocytes,Ua: NEGATIVE
Nitrite: NEGATIVE
Protein, ur: NEGATIVE mg/dL
Specific Gravity, Urine: 1.02 (ref 1.005–1.030)
pH: 6 (ref 5.0–8.0)

## 2021-01-16 LAB — VITAMIN B12: Vitamin B-12: 654 pg/mL (ref 180–914)

## 2021-01-16 LAB — AMMONIA: Ammonia: 13 umol/L (ref 9–35)

## 2021-01-16 LAB — FOLATE: Folate: 17.9 ng/mL (ref >5.9)

## 2021-01-16 LAB — TSH: TSH: 0.828 u[IU]/mL (ref 0.350–4.500)

## 2021-01-16 MED ORDER — VALPROATE SODIUM 100 MG/ML IV SOLN
500.0000 mg | Freq: Two times a day (BID) | INTRAVENOUS | Status: DC
  Administered 2021-01-16: 500 mg via INTRAVENOUS
  Filled 2021-01-16 (×3): qty 5

## 2021-01-16 MED ORDER — ENOXAPARIN SODIUM 60 MG/0.6ML IJ SOSY
1.0000 mg/kg | PREFILLED_SYRINGE | Freq: Two times a day (BID) | INTRAMUSCULAR | Status: DC
  Administered 2021-01-16: 60 mg via SUBCUTANEOUS
  Filled 2021-01-16 (×2): qty 0.6

## 2021-01-16 MED ORDER — DIVALPROEX SODIUM 250 MG PO DR TAB: 500.0000 mg | DELAYED_RELEASE_TABLET | Freq: Two times a day (BID) | ORAL | Status: DC

## 2021-01-16 NOTE — ED Notes (Signed)
Unable to complete orthostatic vital signs on patient. Pt uncooperative with tasks. Pt repeatedly saying "no" and jerking hands away when trying to encourage pt to do tasks,

## 2021-01-16 NOTE — Progress Notes (Signed)
EEG complete - results pending 

## 2021-01-16 NOTE — Assessment & Plan Note (Addendum)
-   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  RV systolic function mildly reduced -Treated with Lovenox during hospitalization until speech evaluation could be performed.  He has completed loading dose course of Eliquis, therefore will discharge on Eliquis 5 mg twice daily

## 2021-01-16 NOTE — Assessment & Plan Note (Signed)
-   Continue Aricept, Seroquel, Zoloft - Continue Ativan as needed

## 2021-01-16 NOTE — Progress Notes (Signed)
Progress Note    Jonathan Berry   [id number]  DOB: June 24, 1945  DOA: 01/15/2021     0 PCP: Scheryl Marten, PA  Initial CC: seizure like activity  Hospital Course: Mr. Jonathan Berry is a 76 yo male with PMH advanced Alzheimer's dementia, HTN, HLD who presented to the hospital with concern for seizure-like activity.  First episode happened when the patient's had was being lowered into a sink to wash his hair with staff at his SNF, when he was reported to become stiff, breathing heavily, and minimally responsive.  There was no associated urinary or stool incontinence.  He was noted to have been described in a postictal state afterwards.  Second episode happened during the CT scan when he again became stiff with some shaking which responded well to a dose of Ativan administered. Neurology was consulted on arrival as well.  He underwent Keppra loading and further work-up with EEG.  Interval History:  Seen this morning in the ER with wife present bedside.  Seizure-like activity was described both when getting his hair washed as well as during the CT scan.  Neurology following.  EEG being performed later today as well.  Assessment & Plan: * Seizure [facility]) - Clinical presentation sounds consistent with seizure activity prior to admission as well as during CT scan.  Etiology unclear but may be related to underlying advanced dementia with increased risk for seizures; other considered etiology was outpatient use of Ativan however SNF MAR reviewed and he has not been getting any Ativan consistently to place him at risk for a withdrawal seizure therefore this is lower on the differential -Neurology following, appreciate assistance - EEG obtained on admission which shows concern for seizure potential; triphasic morphology appreciated -Plan is for overnight EEG per neurology -Ammonia level normal - Loaded with Keppra on admission but recommended to change to Depakote (change to IV for now until safe for PO) to  avoid worsened agitation in setting of underlying dementia and Keppra use -Follow-up SLP eval  Alzheimer disease ([place]) - Continue Aricept, Seroquel, Zoloft - Continue Ativan as needed  Pulmonary emboli ([place])- (present on admission) - 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  RV systolic function mildly reduced - due to awaiting SLP eval, will use lovenox for now and resume eliquis when able    Old records reviewed in assessment of this patient  Antimicrobials:   DVT prophylaxis: Lovenox  Code Status:   Code Status: DNR  Disposition Plan:  Status is: Inpatient  Objective: Blood pressure 126/86, pulse 91, temperature 98.1 F (36.7 C), temperature source Oral, resp. rate (!) 21, SpO2 99 %.  Examination:  Physical Exam Constitutional:      General: He is not in acute distress.    Comments: Awake and follows some commands but has slowed mentation due to underlying dementia  HENT:     Head: Normocephalic and atraumatic.     Mouth/Throat:     Mouth: Mucous membranes are moist.  Eyes:     Extraocular Movements: Extraocular movements intact.  Cardiovascular:     Rate and Rhythm: Normal rate and regular rhythm.     Heart sounds: Normal heart sounds.  Pulmonary:     Effort: Pulmonary effort is normal. No respiratory distress.     Breath sounds: Normal breath sounds. No wheezing.  Abdominal:     General: Bowel sounds are normal. There is no distension.     Palpations: Abdomen is  soft.     Tenderness: There is no abdominal tenderness.  Musculoskeletal:        General: Normal range of motion.     Cervical back: Normal range of motion and neck supple.  Skin:    General: Skin is warm and dry.  Neurological:     Comments: Slowed mentation.  Follows some commands  Psychiatric:        Behavior: Behavior normal.     Consultants:  Neurology  Procedures:  EEG, 01/16/2021  Data Reviewed: I  have personally reviewed labs and imaging studies    LOS: 0 days  Time spent: Greater than 50% of the 35 minute visit was spent in counseling/coordination of care for the patient as laid out in the A&P.   Dwyane Dee, MD [facility] 01/16/2021, 5:31 PM

## 2021-01-16 NOTE — Progress Notes (Signed)
Subjective: No clinical seizures overnight.  Per wife patient has been mostly sleeping since yesterday.  Denies any previous seizures or episodes of alteration of awareness.  Denies any new medication changes.  ROS: negative except above Examination  Vital signs in last 24 hours: Temp:  [98.1 F (36.7 C)] 98.1 F (36.7 C) (01/03 1248) Pulse Rate:  [62-105] 68 (01/04 0900) Resp:  [14-21] 17 (01/04 0900) BP: (94-141)/(57-101) 128/63 (01/04 0900) SpO2:  [91 %-100 %] 100 % (01/04 0900)  General: lying in bed, NAD CVS: pulse-normal rate and rhythm RS: breathing comfortably, CTAB Extremities: warn, no edema Neuro: Drowsy, opens eyes and looks at examiner but does not follow commands and falls back asleep, PERRLA, tracks examiner in room, no facial asymmetry, spontaneously moving upper extremities in bed with antigravity strength, bilateral upper extremity tremor predominantly at rest   Basic Metabolic Panel: Recent Labs  Lab 01/10/21 0131 01/15/21 1345 01/15/21 2227  NA 138 141 142  K 3.8 4.1 3.7  CL 102 107  --   CO2 29 25  --   GLUCOSE 92 102*  --   BUN 10 8  --   CREATININE 0.90 0.96  --   CALCIUM 8.7* 9.3  --   MG  --  2.2  --     CBC: Recent Labs  Lab 01/10/21 0131 01/15/21 1345 01/15/21 2227  WBC 10.0 8.9  --   NEUTROABS  --  7.4  --   HGB 12.1* 12.7* 11.9*  HCT 36.4* 39.5 35.0*  MCV 87.1 89.0  --   PLT 184 421*  --      Coagulation Studies: No results for input(s): LABPROT, INR in the last 72 hours.  Imaging  MRI brain without contrast 01/25/2021: No acute intracranial process. No definite seizure etiology identified.  ASSESSMENT AND PLAN: 76 year old male with Alzheimer's disease presented with new onset seizures.  New onset seizures -No clear provoking factors.   -Alzheimer's disease does put patient at increased risk for seizure recurrence.   Recommendations -Patiently reports patient has had bouts of agitation prior to this admission.  He has  also been excessively drowsy on Keppra.  Therefore would recommend switching to Depakote 500 mg twice daily -Routine EEG showed generalized periodic discharges with Triphasic morphology which are likely due to toxic-metabolic causes. We will obtain video EEG overnight for further evaluation of the EEG pattern and to look for intermittent seizures -Will also check ammonia levels  -Continue seizure precautions -Discussed plan with patient's wife at bedside and Dr. Frederick Peers via secure chat  I have spent a total of 37 minutes with the patient reviewing hospital notes,  test results, labs and examining the patient as well as establishing an assessment and plan.  > 50% of time was spent in direct patient care.   Lindie Spruce Epilepsy [facility] For questions after 5pm please refer to AMION to reach the Neurologist on call

## 2021-01-16 NOTE — Procedures (Signed)
Patient Name: Gohan Collister  MRN: [id number]  Epilepsy Attending: Charlsie Quest  Referring Physician/Provider: Dr Lindie Spruce Date: 01/16/2021 Duration: 27.19 mins  Patient history: 76 year old male with Alzheimer's disease presented with new onset seizures. EEG to evaluate for seizure  Level of alertness: lethargic   AEDs during EEG study: LEV  Technical aspects: This EEG study was done with scalp electrodes positioned according to the 10-20 International system of electrode placement. Electrical activity was acquired at a sampling rate of 500Hz  and reviewed with a high frequency filter of 70Hz  and a low frequency filter of 1Hz . EEG data were recorded continuously and digitally stored.   Description: EEG showed continuous generalized 3 to 6 Hz theta-delta slowing. Generalized periodic discharges with triphasic morphology at 1.5 Hz were also noted. Hyperventilation and photic stimulation were not performed.     ABNORMALITY - Periodic discharges with triphasic morphology, generalized ( GPDs) - Continuous slow, generalized  IMPRESSION: This study showed generalized periodic discharges with triphasic morphology at 1.5 Hz which is on the ictal-interictal continuum with low potential for seizures. This EEG pattern can also be seen due to toxic-metabolic causes.    Additionally, this study is suggestive of moderate diffuse encephalopathy, nonspecific etiology. No seizures were seen throughout the recording.   Riku Buttery 

## 2021-01-16 NOTE — Progress Notes (Signed)
SLP Cancellation Note  Patient Details Name: Jonathan Berry MRN: [id number] DOB: 03/27/1945   Cancelled treatment:        Attempted to see pt for clinical swallowing evaluation.  Pt was unavailable at time of attempt for EEG.  SLP will reattempt as schedule permits.    Kerrie Pleasure, MA, CCC-SLP Acute Rehabilitation Services Office: [phone] 01/16/2021, 12:05 PM

## 2021-01-16 NOTE — Progress Notes (Signed)
Started cEEG study.   

## 2021-01-16 NOTE — Assessment & Plan Note (Addendum)
-   Clinical presentation sounds consistent with seizure activity prior to admission as well as during CT scan.  Etiology unclear but may be related to underlying advanced dementia with increased risk for seizures; other considered etiology was outpatient use of Ativan however SNF MAR reviewed and he has not been getting any Ativan consistently to place him at risk for a withdrawal seizure therefore this is lower on the differential -Neurology following, appreciate assistance - EEG obtained on admission which shows concern for seizure potential; triphasic morphology appreciated - no seizure activity noted on overnight EEG per neurology - recommendation is to continue Depakote at discharge and outpatient follow-up with outpatient neurologist, Dr. Lajean Silvius -Of note, patient was also evaluated by speech therapy and deemed appropriate for continuing regular diet

## 2021-01-16 NOTE — Plan of Care (Signed)

## 2021-01-16 NOTE — Hospital Course (Addendum)
Jonathan Berry is a 76 yo male with PMH advanced Alzheimer's dementia, HTN, HLD who presented to the hospital with concern for seizure-like activity.  First episode happened when the patient's had was being lowered into a sink to wash his hair with staff at his SNF, when he was reported to become stiff, breathing heavily, and minimally responsive.  There was no associated urinary or stool incontinence.  He was noted to have been described in a postictal state afterwards.  Second episode happened during the CT scan when he again became stiff with some shaking which responded well to a dose of Ativan administered. Neurology was consulted on arrival as well.  He underwent Keppra loading and further work-up with EEG. He also underwent overnight EEG testing.  There was concern for seizure activity contributing to his hospitalization and he was recommended by neurology to continue on Depakote at discharge.

## 2021-01-17 ENCOUNTER — Other Ambulatory Visit (HOSPITAL_COMMUNITY): Payer: Self-pay

## 2021-01-17 LAB — BASIC METABOLIC PANEL
Anion gap: 7 (ref 5–15)
BUN: 5 mg/dL — ABNORMAL LOW (ref 8–23)
CO2: 27 mmol/L (ref 22–32)
Calcium: 8.6 mg/dL — ABNORMAL LOW (ref 8.9–10.3)
Chloride: 105 mmol/L (ref 98–111)
Creatinine, Ser: 0.82 mg/dL (ref 0.61–1.24)
GFR, Estimated: >60 mL/min (ref >60)
Glucose, Bld: 89 mg/dL (ref 70–99)
Potassium: 3.5 mmol/L (ref 3.5–5.1)
Sodium: 139 mmol/L (ref 135–145)

## 2021-01-17 LAB — CBC WITH DIFFERENTIAL/PLATELET
Abs Immature Granulocytes: 0.06 10*3/uL (ref 0.00–0.07)
Basophils Absolute: 0.1 10*3/uL (ref 0.0–0.1)
Basophils Relative: 1 %
Eosinophils Absolute: 0.2 10*3/uL (ref 0.0–0.5)
Eosinophils Relative: 2 %
HCT: 36.4 % — ABNORMAL LOW (ref 39.0–52.0)
Hemoglobin: 11.9 g/dL — ABNORMAL LOW (ref 13.0–17.0)
Immature Granulocytes: 1 %
Lymphocytes Relative: 15 %
Lymphs Abs: 1.7 10*3/uL (ref 0.7–4.0)
MCH: 28.6 pg (ref 26.0–34.0)
MCHC: 32.7 g/dL (ref 30.0–36.0)
MCV: 87.5 fL (ref 80.0–100.0)
Monocytes Absolute: 0.9 10*3/uL (ref 0.1–1.0)
Monocytes Relative: 8 %
Neutro Abs: 8.7 10*3/uL — ABNORMAL HIGH (ref 1.7–7.7)
Neutrophils Relative %: 73 %
Platelets: 398 10*3/uL (ref 150–400)
RBC: 4.16 MIL/uL — ABNORMAL LOW (ref 4.22–5.81)
RDW: 13.5 % (ref 11.5–15.5)
WBC: 11.6 10*3/uL — ABNORMAL HIGH (ref 4.0–10.5)
nRBC: 0 % (ref 0.0–0.2)

## 2021-01-17 LAB — MAGNESIUM: Magnesium: 2.1 mg/dL (ref 1.7–2.4)

## 2021-01-17 MED ORDER — APIXABAN 5 MG PO TABS: 5.0000 mg | ORAL_TABLET | Freq: Two times a day (BID) | ORAL | 3 refills | Status: AC

## 2021-01-17 MED ORDER — DIVALPROEX SODIUM 250 MG PO DR TAB
500.0000 mg | DELAYED_RELEASE_TABLET | Freq: Two times a day (BID) | ORAL | 0 refills | Status: DC
  Filled 2021-01-17: qty 12, 3d supply, fill #0

## 2021-01-17 MED ORDER — APIXABAN 5 MG PO TABS: 5.0000 mg | ORAL_TABLET | Freq: Two times a day (BID) | ORAL | 3 refills | Status: DC

## 2021-01-17 MED ORDER — VALPROIC ACID 250 MG PO CAPS
500.0000 mg | ORAL_CAPSULE | Freq: Two times a day (BID) | ORAL | Status: DC
  Administered 2021-01-17: 500 mg via ORAL
  Filled 2021-01-17 (×2): qty 2

## 2021-01-17 MED ORDER — DIVALPROEX SODIUM 250 MG PO DR TAB: 500.0000 mg | DELAYED_RELEASE_TABLET | Freq: Two times a day (BID) | ORAL | 3 refills | Status: DC

## 2021-01-17 MED ORDER — APIXABAN 5 MG PO TABS: 10.0000 mg | ORAL_TABLET | Freq: Two times a day (BID) | ORAL | Status: DC

## 2021-01-17 MED ORDER — APIXABAN 5 MG PO TABS
5.0000 mg | ORAL_TABLET | Freq: Two times a day (BID) | ORAL | Status: DC
  Administered 2021-01-17: 5 mg via ORAL
  Filled 2021-01-17: qty 1

## 2021-01-17 MED ORDER — VALPROIC ACID 250 MG PO CAPS: 500.0000 mg | ORAL_CAPSULE | Freq: Two times a day (BID) | ORAL | 3 refills | Status: DC

## 2021-01-17 NOTE — Progress Notes (Signed)
TOC pharmacy provided 3 day supply of Depakote, delivered to the room. AVS and DNR form placed in discharge packet and given to the patient's wife. Patient assisted into family member's vehicle.

## 2021-01-17 NOTE — Evaluation (Addendum)
Clinical/Bedside Swallow Evaluation Patient Details  Name: Jonathan Berry MRN: [id number] Date of Birth: 1945/05/03  Today's Date: 01/17/2021 Time: SLP Start Time (ACUTE ONLY): [id number] SLP Stop Time (ACUTE ONLY): 1021 SLP Time Calculation (min) (ACUTE ONLY): 28 min  Past Medical History:  Past Medical History:  Diagnosis Date   Alzheimer disease ([place])    COVID    Hypercholesteremia    Hypertension    Past Surgical History:  Past Surgical History:  Procedure Laterality Date   KNEE SURGERY     MANDIBLE SURGERY     HPI:  Pt is a 76 y.o. male who presented to the hospital with concern for seizure-like activity. CT/MRI negative. EEG 01/16/21 revealed triphasic morphology with note that study is "suggestive of moderate diffuse encephalopathy... No seizures were seen". CXR 01/15/21 revealed "No acute cardiopulmonary abnormality". Previous BSE 04/26/20 revealed normal oropharyngeal swallow. Soft diet with thin recommended at that time. PMH: advanced Alzheimer's dementia, HTN, HLD.    Assessment / Plan / Recommendation  Clinical Impression  RN verbalized that care team is waiting for completion of SLP eval for pt to be placed on more advanced diet if possible. Pt alert this am for swallow eval with wife present at bedside. At [facility], wife reports pt consuming regular, thin liquid diet without difficulty. Oral mechansim examination limited due to pt's decreased ability to follow commands, however lingual/labial ROM, coordination and symmetry appeared grossly functional. Pt with fleeting attention, which resulted in intermittent oral holding and suspected delay in swallow initation during PO intake. Thin liquids via consecutive straw sips resulted in very mild wet vocal quality and throat clearing. With cues for small sips and manual removal of straw, s/sx were reduced. Bites of puree and regular textures were consumed without difficulty and complete oral clearance noted upon inspection. Pt required intermittent  cueing to maintain attention to POs and abstain from talking with consumption as s/sx of aspiration increased during these instances. Recommend regular, thin liquid diet with full supervision during meals for small bites/sips at slow rate and cues to maintain attention. SLP f/u warranted to assess diet tolerance and if further strategies/modifications are required to improve safety with POs. Pt, wife and RN expressed agreement with recommendations and ST POC.  SLP Visit Diagnosis: Dysphagia, unspecified (R13.10)    Aspiration Risk  Mild aspiration risk    Diet Recommendation Regular;Thin liquid   Liquid Administration via: Cup;Straw Medication Administration: Whole meds with liquid Supervision: Patient able to self feed;Full supervision/cueing for compensatory strategies Compensations: Minimize environmental distractions;Slow rate;Small sips/bites Postural Changes: Seated upright at 90 degrees    Other  Recommendations Oral Care Recommendations: Oral care BID    Recommendations for follow up therapy are one component of a multi-disciplinary discharge planning process, led by the attending physician.  Recommendations may be updated based on patient status, additional functional criteria and insurance authorization.  Follow up Recommendations Other (comment) (TBD)      Assistance Recommended at Discharge Frequent or constant Supervision/Assistance  Functional Status Assessment Patient has had a recent decline in their functional status and demonstrates the ability to make significant improvements in function in a reasonable and predictable amount of time.  Frequency and Duration min 2x/week  2 weeks       Prognosis Prognosis for Safe Diet Advancement: Good Barriers to Reach Goals: Cognitive deficits      Swallow Study   General Date of Onset: 01/16/20 HPI: Pt is a 76 y.o. male who presented to the hospital with concern for seizure-like activity.  CT/MRI negative. EEG 01/17/20 revealed  triphasic morphology with note that study is "suggestive of moderate diffuse encephalopathy... No seizures were seen". CXR 01/16/20 revealed "No acute cardiopulmonary abnormality". Previous BSE 04/26/20 revealed normal oropharyngeal swallow. Soft diet with thin recommended at that time. PMH: advanced Alzheimer's dementia, HTN, HLD. Type of Study: Bedside Swallow Evaluation Previous Swallow Assessment: see HPI Diet Prior to this Study: Other (Comment);Thin liquids (clear liquids) Temperature Spikes Noted: No Respiratory Status: Room air History of Recent Intubation: No Behavior/Cognition: Alert;Cooperative;Pleasant mood;Confused Oral Cavity Assessment: Within Functional Limits Oral Care Completed by SLP: No Oral Cavity - Dentition: Adequate natural dentition Vision: Functional for self-feeding Self-Feeding Abilities: Able to feed self;Needs assist Patient Positioning: Upright in bed;Postural control adequate for testing Baseline Vocal Quality: Normal    Oral/Motor/Sensory Function Overall Oral Motor/Sensory Function: Within functional limits   Ice Chips Ice chips: Within functional limits Presentation: Spoon   Thin Liquid Thin Liquid: Impaired Presentation: Straw;Self Fed Pharyngeal  Phase Impairments: Throat Clearing - Delayed;Throat Clearing - Immediate;Wet Vocal Quality    Nectar Thick Nectar Thick Liquid: Not tested   Honey Thick Honey Thick Liquid: Not tested   Puree Puree: Within functional limits Presentation: Spoon   Solid     Solid: Within functional limits Presentation: [place], [place], [place] Office Number: [place] 01/17/2021,10:41 AM

## 2021-01-17 NOTE — Progress Notes (Signed)
Patient is being discharged back to ALF. Report was called to [facility], Charity fundraiser. The patient's wife will be providing transportation.   The facility will be unable to get the patient's bedtime dose of Depakote for this evening. MD and social worker made aware.

## 2021-01-17 NOTE — Discharge Summary (Signed)
Physician Discharge Summary   Jonathan Berry [id number] DOB: January 14, 1945 DOA: 01/15/2021  PCP: Scheryl Marten, PA  Admit date: 01/15/2021 Discharge date:  01/17/2021  Admitted From: ALF Disposition:  ALF Discharging physician: Dwyane Dee, MD  Recommendations for Outpatient Follow-up:  Follow up with neurology   Home Health:  Equipment/Devices:   Discharge Condition: stable CODE STATUS: DNR Diet recommendation:  Diet Orders (From admission, onward)     Start     Ordered   01/17/21 1017  Diet regular Room service appropriate? No; Fluid consistency: Thin  Diet effective now       Question Answer Comment  Room service appropriate? No   Fluid consistency: Thin      01/17/21 1017   01/17/21 0000  Diet general        01/17/21 1232            Hospital Course: Mr. Jonathan Berry is a 76 yo male with PMH advanced Alzheimer's dementia, HTN, HLD who presented to the hospital with concern for seizure-like activity.  First episode happened when the patient's had was being lowered into a sink to wash his hair with staff at his SNF, when he was reported to become stiff, breathing heavily, and minimally responsive.  There was no associated urinary or stool incontinence.  He was noted to have been described in a postictal state afterwards.  Second episode happened during the CT scan when he again became stiff with some shaking which responded well to a dose of Ativan administered. Neurology was consulted on arrival as well.  He underwent Keppra loading and further work-up with EEG. He also underwent overnight EEG testing.  There was concern for seizure activity contributing to his hospitalization and he was recommended by neurology to continue on Depakote at discharge.  * Seizure ([place]) - Clinical presentation sounds consistent with seizure activity prior to admission as well as during CT scan.  Etiology unclear but may be related to underlying advanced dementia with increased risk for seizures;  other considered etiology was outpatient use of Ativan however SNF MAR reviewed and he has not been getting any Ativan consistently to place him at risk for a withdrawal seizure therefore this is lower on the differential -Neurology following, appreciate assistance - EEG obtained on admission which shows concern for seizure potential; triphasic morphology appreciated - no seizure activity noted on overnight EEG per neurology - recommendation is to continue Depakote at discharge and outpatient follow-up with outpatient neurologist, Dr. Rondel Oh -Of note, patient was also evaluated by speech therapy and deemed appropriate for continuing regular diet  Alzheimer disease (Jonathan Berry) - Continue Aricept, Seroquel, Zoloft - Continue Ativan as needed  Pulmonary emboli (Jonathan Berry)- (present on admission) - 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  RV systolic function mildly reduced -Treated with Lovenox during hospitalization until speech evaluation could be performed.  He has completed loading dose course of Eliquis, therefore will discharge on Eliquis 5 mg twice daily    The patient's chronic medical conditions were treated accordingly per the patient's home medication regimen except as noted.  On day of discharge, patient was felt deemed stable for discharge. Patient/family member advised to call PCP or come back to ER if needed.   Principal Diagnosis: Seizure [facility])  Discharge Diagnoses: Principal Problem:   Seizure [facility]) Active Problems:   Pulmonary emboli (Jonathan Berry)   Alzheimer disease [facility])   Discharge Instructions     Diet general   Complete  by: As directed    Increase activity slowly   Complete by: As directed    No wound care   Complete by: As directed       Allergies as of 01/17/2021   No Known Allergies      Medication List     STOP taking these medications    Apixaban Starter Pack (10mg  and  5mg ) Commonly known as: ELIQUIS STARTER PACK Replaced by: apixaban 5 MG Tabs tablet       TAKE these medications    apixaban 5 MG Tabs tablet Commonly known as: ELIQUIS Take 1 tablet (5 mg total) by mouth 2 (two) times daily. Replaces: Apixaban Starter Pack (10mg  and 5mg )   divalproex 250 MG DR tablet Commonly known as: Depakote Take 2 tablets (500 mg total) by mouth 2 (two) times daily.   donepezil 10 MG tablet Commonly known as: ARICEPT Take 10 mg by mouth at bedtime.   LORazepam 1 MG tablet Commonly known as: ATIVAN Take 0.5 tablets (0.5 mg total) by mouth 2 (two) times daily as needed for anxiety.   mirtazapine 15 MG tablet Commonly known as: REMERON Take 15 mg by mouth at bedtime.   QUEtiapine 25 MG tablet Commonly known as: SEROQUEL Take 25 mg by mouth daily.   sertraline 25 MG tablet Commonly known as: ZOLOFT Take 25 mg by mouth daily.        Follow-up Information     Carroll Kinds, MD. Schedule an appointment as soon as possible for a visit in 1 month(s).   Specialty: Neurology Contact information: [place] [address] [phone]                No Known Allergies  Consultations: Neurology   Discharge Exam: BP 134/68 (BP Location: Right Arm)    Pulse 77    Temp 98.5 F (36.9 C) (Oral)    Resp 12    SpO2 100%  Physical Exam Constitutional:      General: He is not in acute distress.    Comments: More awake and talking today and able to follow more commands  HENT:     Head: Normocephalic and atraumatic.     Mouth/Throat:     Mouth: Mucous membranes are moist.  Eyes:     Extraocular Movements: Extraocular movements intact.  Cardiovascular:     Rate and Rhythm: Normal rate and regular rhythm.     Heart sounds: Normal heart sounds.  Pulmonary:     Effort: Pulmonary effort is normal. No respiratory distress.     Breath sounds: Normal breath sounds. No wheezing.  Abdominal:     General: Bowel sounds are  normal. There is no distension.     Palpations: Abdomen is soft.     Tenderness: There is no abdominal tenderness.  Musculoskeletal:        General: Normal range of motion.     Cervical back: Normal range of motion and neck supple.  Skin:    General: Skin is warm and dry.  Neurological:     Comments: Mentation improved but still slightly delayed.  Follows commands easily  Psychiatric:        Behavior: Behavior normal.     The results of significant diagnostics from this hospitalization (including imaging, microbiology, ancillary and laboratory) are listed below for reference.   Microbiology: Recent Results (from the past 240 hour(s))  Resp Panel by RT-PCR (Flu A&B, Covid) Nasopharyngeal Swab     Status: None  Collection Time: 01/16/21 10:51 AM   Specimen: Nasopharyngeal Swab; Nasopharyngeal(NP) swabs in vial transport medium  Result Value Ref Range Status   SARS Coronavirus 2 by RT PCR NEGATIVE NEGATIVE Final    Comment: (NOTE) SARS-CoV-2 target nucleic acids are NOT DETECTED.  The SARS-CoV-2 RNA is generally detectable in upper respiratory specimens during the acute phase of infection. The lowest concentration of SARS-CoV-2 viral copies this assay can detect is 138 copies/mL. A negative result does not preclude SARS-Cov-2 infection and should not be used as the sole basis for treatment or other patient management decisions. A negative result may occur with  improper specimen collection/handling, submission of specimen other than nasopharyngeal swab, presence of viral mutation(s) within the areas targeted by this assay, and inadequate number of viral copies(<138 copies/mL). A negative result must be combined with clinical observations, patient history, and epidemiological information. The expected result is Negative.  Fact Sheet for Patients:  BloggerCourse.com  Fact Sheet for Healthcare Providers:  SeriousBroker.it  This  test is no t yet approved or cleared by the [place] FDA and  has been authorized for detection and/or diagnosis of SARS-CoV-2 by FDA under an Emergency Use Authorization (EUA). This EUA will remain  in effect (meaning this test can be used) for the duration of the COVID-19 declaration under Section 564(b)(1) of the Act, 21 U.S.C.section 360bbb-3(b)(1), unless the authorization is terminated  or revoked sooner.       Influenza A by PCR NEGATIVE NEGATIVE Final   Influenza B by PCR NEGATIVE NEGATIVE Final    Comment: (NOTE) The Xpert Xpress SARS-CoV-2/FLU/RSV plus assay is intended as an aid in the diagnosis of influenza from Nasopharyngeal swab specimens and should not be used as a sole basis for treatment. Nasal washings and aspirates are unacceptable for Xpert Xpress SARS-CoV-2/FLU/RSV testing.  Fact Sheet for Patients: BloggerCourse.com  Fact Sheet for Healthcare Providers: SeriousBroker.it  This test is not yet approved or cleared by the [place] FDA and has been authorized for detection and/or diagnosis of SARS-CoV-2 by FDA under an Emergency Use Authorization (EUA). This EUA will remain in effect (meaning this test can be used) for the duration of the COVID-19 declaration under Section 564(b)(1) of the Act, 21 U.S.C. section 360bbb-3(b)(1), unless the authorization is terminated or revoked.  Performed at [facility], [address], [place], [place] [address]      Labs: BNP (last 3 results) Recent Labs    04/26/20 0628 01/06/21 2240  BNP 92.8 166.2*   Basic Metabolic Panel: Recent Labs  Lab 01/15/21 1345 01/15/21 2227 01/17/21 0449  NA 141 142 139  K 4.1 3.7 3.5  CL 107  --  105  CO2 25  --  27  GLUCOSE 102*  --  89  BUN 8  --  5*  CREATININE 0.96  --  0.82  CALCIUM 9.3  --  8.6*  MG 2.2  --  2.1   Liver Function Tests: Recent Labs  Lab 01/15/21 1345  AST 23  ALT 31  ALKPHOS 87   BILITOT 0.5  PROT 6.1*  ALBUMIN 3.4*   No results for input(s): LIPASE, AMYLASE in the last 168 hours. Recent Labs  Lab 01/16/21 1556  AMMONIA 13   CBC: Recent Labs  Lab 01/15/21 1345 01/15/21 2227 01/17/21 0449  WBC 8.9  --  11.6*  NEUTROABS 7.4  --  8.7*  HGB 12.7* 11.9* 11.9*  HCT 39.5 35.0* 36.4*  MCV 89.0  --  87.5  PLT 421*  --  398   Cardiac Enzymes: No results for input(s): CKTOTAL, CKMB, CKMBINDEX, TROPONINI in the last 168 hours. BNP: Invalid input(s): POCBNP CBG: No results for input(s): GLUCAP in the last 168 hours. D-Dimer No results for input(s): DDIMER in the last 72 hours. Hgb A1c No results for input(s): HGBA1C in the last 72 hours. Lipid Profile No results for input(s): CHOL, HDL, LDLCALC, TRIG, CHOLHDL, LDLDIRECT in the last 72 hours. Thyroid function studies Recent Labs    01/16/21 1556  TSH 0.828   Anemia work up Recent Labs    01/16/21 1556  VITAMINB12 654  FOLATE 17.9   Urinalysis    Component Value Date/Time   COLORURINE YELLOW 01/15/2021 [place] 01/15/2021 1241   LABSPEC 1.020 01/15/2021 1241   PHURINE 6.0 01/15/2021 1241   GLUCOSEU NEGATIVE 01/15/2021 1241   HGBUR LARGE (A) 01/15/2021 1241   BILIRUBINUR NEGATIVE 01/15/2021 1241   KETONESUR NEGATIVE 01/15/2021 1241   PROTEINUR NEGATIVE 01/15/2021 1241   NITRITE NEGATIVE 01/15/2021 1241   LEUKOCYTESUR NEGATIVE 01/15/2021 1241   Sepsis Labs Invalid input(s): PROCALCITONIN,  WBC,  LACTICIDVEN Microbiology Recent Results (from the past 240 hour(s))  Resp Panel by RT-PCR (Flu A&B, Covid) Nasopharyngeal Swab     Status: None   Collection Time: 01/16/21 10:51 AM   Specimen: Nasopharyngeal Swab; Nasopharyngeal(NP) swabs in vial transport medium  Result Value Ref Range Status   SARS Coronavirus 2 by RT PCR NEGATIVE NEGATIVE Final    Comment: (NOTE) SARS-CoV-2 target nucleic acids are NOT DETECTED.  The SARS-CoV-2 RNA is generally detectable in upper  respiratory specimens during the acute phase of infection. The lowest concentration of SARS-CoV-2 viral copies this assay can detect is 138 copies/mL. A negative result does not preclude SARS-Cov-2 infection and should not be used as the sole basis for treatment or other patient management decisions. A negative result may occur with  improper specimen collection/handling, submission of specimen other than nasopharyngeal swab, presence of viral mutation(s) within the areas targeted by this assay, and inadequate number of viral copies(<138 copies/mL). A negative result must be combined with clinical observations, patient history, and epidemiological information. The expected result is Negative.  Fact Sheet for Patients:  EntrepreneurPulse.com.au  Fact Sheet for Healthcare Providers:  IncredibleEmployment.be  This test is no t yet approved or cleared by the [place] FDA and  has been authorized for detection and/or diagnosis of SARS-CoV-2 by FDA under an Emergency Use Authorization (EUA). This EUA will remain  in effect (meaning this test can be used) for the duration of the COVID-19 declaration under Section 564(b)(1) of the Act, 21 U.S.C.section 360bbb-3(b)(1), unless the authorization is terminated  or revoked sooner.       Influenza A by PCR NEGATIVE NEGATIVE Final   Influenza B by PCR NEGATIVE NEGATIVE Final    Comment: (NOTE) The Xpert Xpress SARS-CoV-2/FLU/RSV plus assay is intended as an aid in the diagnosis of influenza from Nasopharyngeal swab specimens and should not be used as a sole basis for treatment. Nasal washings and aspirates are unacceptable for Xpert Xpress SARS-CoV-2/FLU/RSV testing.  Fact Sheet for Patients: EntrepreneurPulse.com.au  Fact Sheet for Healthcare Providers: IncredibleEmployment.be  This test is not yet approved or cleared by the [place] FDA and has been  authorized for detection and/or diagnosis of SARS-CoV-2 by FDA under an Emergency Use Authorization (EUA). This EUA will remain in effect (meaning this test can be used) for the duration of the COVID-19 declaration under Section  564(b)(1) of the Act, 21 U.S.C. section 360bbb-3(b)(1), unless the authorization is terminated or revoked.  Performed at [facility], [address], [place], [place] [address]     Procedures/Studies: CT HEAD WO CONTRAST ( )  Result Date: 01/15/2021 CLINICAL DATA:  Provided history: Seizure, new onset, no history of trauma; mental status change, unknown cause. EXAM: CT HEAD WITHOUT CONTRAST TECHNIQUE: Contiguous axial images were obtained from the base of the skull through the vertex without intravenous contrast. COMPARISON:  CT head/cervical spine 01/02/2021. FINDINGS: Brain: Moderate generalized cerebral atrophy. Comparatively mild cerebellar atrophy. Mild patchy and ill-defined hypoattenuation within the cerebral white matter, nonspecific but compatible with chronic small vessel ischemic disease. There is no acute intracranial hemorrhage. No demarcated cortical infarct. No extra-axial fluid collection. No evidence of an intracranial mass. No midline shift. Vascular: No hyperdense vessel. Atherosclerotic calcifications. Skull: Normal. Negative for fracture or focal lesion. Sinuses/Orbits: Visualized orbits show no acute finding. Tiny mucous retention cyst within the right maxillary sinus. Trace mucosal thickening within the bilateral sphenoid sinuses. Minimal scattered mucosal thickening and fluid within the bilateral ethmoid sinuses. IMPRESSION: No evidence of acute intracranial abnormality. Mild chronic small vessel ischemic changes within the cerebral white matter. Moderate generalized cerebral atrophy. Comparatively mild cerebellar atrophy. Mild paranasal sinus disease, as described. Electronically Signed   By: Jackey Loge D.O.   On: 01/15/2021 14:06   CT Head  Wo Contrast  Result Date: 01/02/2021 CLINICAL DATA:  Provided history: Head trauma, minor. Facial trauma, blunt. Neck trauma. Additional history provided: Fall, laceration to chin and lip. EXAM: CT HEAD WITHOUT CONTRAST CT MAXILLOFACIAL WITHOUT CONTRAST CT CERVICAL SPINE WITHOUT CONTRAST TECHNIQUE: Multidetector CT imaging of the head, cervical spine, and maxillofacial structures were performed using the standard protocol without intravenous contrast. Multiplanar CT image reconstructions of the cervical spine and maxillofacial structures were also generated. COMPARISON:  No pertinent prior exams available for comparison. FINDINGS: CT HEAD FINDINGS Brain: Moderate generalized cerebral atrophy. Comparatively mild cerebellar atrophy. Commensurate prominence of the ventricles and sulci. Mild ill-defined hypoattenuation within the cerebral white matter, nonspecific but compatible with chronic small vessel ischemic disease. There is no acute intracranial hemorrhage. No demarcated cortical infarct. No extra-axial fluid collection. No evidence of an intracranial mass. No midline shift. Vascular: No hyperdense vessel. Atherosclerotic calcifications. Skull: Normal. Negative for fracture or focal lesion. Other: No significant mastoid effusion. CT MAXILLOFACIAL FINDINGS Osseous: No acute maxillofacial fracture is identified. Orbits: No acute or significant orbital finding. Sinuses: Tiny mucous retention cyst and trace mucosal thickening within the right maxillary sinus. Trace mucosal thickening within the left maxillary sinus. Fluid opacification of the right ethmoid air cell. Soft tissues: No definite soft tissue swelling/hematoma appreciable by CT. CT CERVICAL SPINE FINDINGS Alignment: Mild cervicothoracic levocurvature. No significant spondylolisthesis. Skull base and vertebrae: The basion-dental and atlanto-dental intervals are maintained.No evidence of acute fracture to the cervical spine. At C4-C5, there is fusion  across the disc space and facet joint ankylosis. Multilevel ventral osteophytes. Soft tissues and spinal canal: No prevertebral fluid or swelling. No visible canal hematoma. Disc levels: Cervical spondylosis with multilevel disc space narrowing, disc bulges/central disc protrusions, posterior disc osteophytes, endplate spurring, uncovertebral hypertrophy and facet arthrosis. No appreciable high-grade spinal canal stenosis. Multilevel bony neural foraminal narrowing. Upper chest: No consolidation within the imaged lung apices. No visible pneumothorax. Other: Calcified plaque within the visualized proximal major branch vessels of the neck and both carotid arteries. IMPRESSION: CT head: 1. No evidence of acute intracranial abnormality. 2. Mild chronic  small vessel ischemic changes within the cerebral white matter. 3. Moderate generalized cerebral atrophy. Comparatively mild cerebellar atrophy. CT maxillofacial: 1. No evidence of acute maxillofacial fracture. 2. Mild paranasal sinus disease, as described. CT cervical spine: 1. No evidence of acute fracture to the cervical spine. 2. Mild cervicothoracic levocurvature. 3. Cervical spondylosis, as described. 4. C4-C5 ankylosis. Electronically Signed   By: Jackey Loge D.O.   On: 01/02/2021 13:22   CT Angio Chest PE W and/or Wo Contrast  Result Date: 01/06/2021 CLINICAL DATA:  Loss of consciousness with 2 syncopal episodes. Concern for pulmonary embolus EXAM: CT ANGIOGRAPHY CHEST WITH CONTRAST TECHNIQUE: Multidetector CT imaging of the chest was performed using the standard protocol during bolus administration of intravenous contrast. Multiplanar CT image reconstructions and MIPs were obtained to evaluate the vascular anatomy. CONTRAST:  10mL OMNIPAQUE IOHEXOL 350 MG/ML SOLN COMPARISON:  Chest radiograph May 08, 2020 FINDINGS: Cardiovascular: Pulmonary emboli in the bilateral lobar pulmonary arteries with extension into segmental and subsegmental branches. The right  ventricular to left ventricular ratio is 2.4. Normal size heart. No significant pericardial effusion/thickening. Aortic atherosclerosis without aneurysmal dilation. Mediastinum/Nodes: No discrete thyroid nodule. No pathologically enlarged mediastinal, hilar or axillary lymph nodes. The trachea and esophagus are grossly unremarkable. Lungs/Pleura: Biapical pleuroparenchymal scarring. No suspicious pulmonary nodules or masses. No focal airspace consolidation. No pleural effusion. No pneumothorax. Upper Abdomen: No acute abnormality. Musculoskeletal: Multilevel degenerative changes spine. No acute osseous abnormality. Review of the MIP images confirms the above findings. IMPRESSION: 1. Positive for acute PE with CT evidence of right heart strain (RV/LV Ratio = 2.4) consistent with at least submassive (intermediate risk) PE. The presence of right heart strain has been associated with an increased risk of morbidity and mortality. 2. Aortic Atherosclerosis (ICD10-I70.0). These results were called by telephone at the time of interpretation on 01/06/2021 at 5:18 pm to provider PA Sol Passer, who verbally acknowledged these results. Electronically Signed   By: Maudry Mayhew M.D.   On: 01/06/2021 17:22   CT Cervical Spine Wo Contrast  Result Date: 01/02/2021 CLINICAL DATA:  Provided history: Head trauma, minor. Facial trauma, blunt. Neck trauma. Additional history provided: Fall, laceration to chin and lip. EXAM: CT HEAD WITHOUT CONTRAST CT MAXILLOFACIAL WITHOUT CONTRAST CT CERVICAL SPINE WITHOUT CONTRAST TECHNIQUE: Multidetector CT imaging of the head, cervical spine, and maxillofacial structures were performed using the standard protocol without intravenous contrast. Multiplanar CT image reconstructions of the cervical spine and maxillofacial structures were also generated. COMPARISON:  No pertinent prior exams available for comparison. FINDINGS: CT HEAD FINDINGS Brain: Moderate generalized cerebral atrophy.  Comparatively mild cerebellar atrophy. Commensurate prominence of the ventricles and sulci. Mild ill-defined hypoattenuation within the cerebral white matter, nonspecific but compatible with chronic small vessel ischemic disease. There is no acute intracranial hemorrhage. No demarcated cortical infarct. No extra-axial fluid collection. No evidence of an intracranial mass. No midline shift. Vascular: No hyperdense vessel. Atherosclerotic calcifications. Skull: Normal. Negative for fracture or focal lesion. Other: No significant mastoid effusion. CT MAXILLOFACIAL FINDINGS Osseous: No acute maxillofacial fracture is identified. Orbits: No acute or significant orbital finding. Sinuses: Tiny mucous retention cyst and trace mucosal thickening within the right maxillary sinus. Trace mucosal thickening within the left maxillary sinus. Fluid opacification of the right ethmoid air cell. Soft tissues: No definite soft tissue swelling/hematoma appreciable by CT. CT CERVICAL SPINE FINDINGS Alignment: Mild cervicothoracic levocurvature. No significant spondylolisthesis. Skull base and vertebrae: The basion-dental and atlanto-dental intervals are maintained.No evidence of acute fracture to the cervical spine.  At C4-C5, there is fusion across the disc space and facet joint ankylosis. Multilevel ventral osteophytes. Soft tissues and spinal canal: No prevertebral fluid or swelling. No visible canal hematoma. Disc levels: Cervical spondylosis with multilevel disc space narrowing, disc bulges/central disc protrusions, posterior disc osteophytes, endplate spurring, uncovertebral hypertrophy and facet arthrosis. No appreciable high-grade spinal canal stenosis. Multilevel bony neural foraminal narrowing. Upper chest: No consolidation within the imaged lung apices. No visible pneumothorax. Other: Calcified plaque within the visualized proximal major branch vessels of the neck and both carotid arteries. IMPRESSION: CT head: 1. No evidence  of acute intracranial abnormality. 2. Mild chronic small vessel ischemic changes within the cerebral white matter. 3. Moderate generalized cerebral atrophy. Comparatively mild cerebellar atrophy. CT maxillofacial: 1. No evidence of acute maxillofacial fracture. 2. Mild paranasal sinus disease, as described. CT cervical spine: 1. No evidence of acute fracture to the cervical spine. 2. Mild cervicothoracic levocurvature. 3. Cervical spondylosis, as described. 4. C4-C5 ankylosis. Electronically Signed   By: Kellie Simmering D.O.   On: 01/02/2021 13:22   MR BRAIN WO CONTRAST  Result Date: 01/15/2021 CLINICAL DATA:  Seizure, new onset EXAM: MRI HEAD WITHOUT CONTRAST TECHNIQUE: Multiplanar, multiecho pulse sequences of the brain and surrounding structures were obtained without intravenous contrast. COMPARISON:  No prior MRI, correlation is made with CT head 01/15/2021 and 01/02/2021 FINDINGS: Brain: Restricted diffusion to suggest acute or subacute infarct. No acute hemorrhage, mass, mass effect, or midline shift. No hydrocephalus or extra-axial collection. Degree of ventricular enlargement is commensurate with sulcal size, with advanced cerebral atrophy for age. No lobar predominance to the volume loss. 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 Grossly symmetric hippocampi, which are atrophied to a similar degree as the rest of the brain. The hippocampi are grossly normal in signal. No heterotopia or focal cortical dysplasia. Vascular: Normal flow voids. Skull and upper cervical spine: Normal marrow signal. Sinuses/Orbits: No significant mucosal thickening. The orbits are unremarkable. Other: None. IMPRESSION: No acute intracranial process. No definite seizure etiology identified. Electronically Signed   By: Merilyn Baba M.D.   On: 01/15/2021 18:13   DG Chest Port 1 View  Result Date: 01/15/2021 CLINICAL DATA:  Altered mental status EXAM:  PORTABLE CHEST 1 VIEW COMPARISON:  Chest x-ray dated December twenty-fifth 2022 FINDINGS: Cardiac and mediastinal contours are unchanged within normal limits. Unchanged biapical pleuroparenchymal scarring and mild apical pleural calcifications. Mild bibasilar atelectasis. No large pleural effusion. No evidence of pneumothorax. IMPRESSION: No acute cardiopulmonary abnormality. Electronically Signed   By: Yetta Glassman M.D.   On: 01/15/2021 13:12   DG Chest Portable 1 View  Result Date: 01/06/2021 CLINICAL DATA:  evaluate for aspiration EXAM: PORTABLE CHEST 1 VIEW COMPARISON:  May 08, 2020 FINDINGS: The cardiomediastinal silhouette is unchanged in contour. No pleural effusion. No pneumothorax. No acute pleuroparenchymal abnormality. Visualized abdomen is unremarkable. Multilevel degenerative changes of the thoracic spine. IMPRESSION: No acute cardiopulmonary abnormality. Electronically Signed   By: Valentino Saxon M.D.   On: 01/06/2021 15:00   EEG adult  Result Date: 01/16/2021 Lora Havens, MD     01/16/2021  1:54 PM Patient Name: Jonathan Berry MRN: [id number] Epilepsy Attending: Lora Havens Referring Physician/Provider: Dr Zeb Comfort Date: 01/16/2021 Duration: 27.19 mins Patient history: 76 year old male with Alzheimer's disease presented with new onset seizures. EEG to evaluate for seizure Level of alertness: lethargic AEDs during EEG study: LEV Technical aspects: This EEG study was done with scalp  electrodes positioned according to the 10-20 International system of electrode placement. Electrical activity was acquired at a sampling rate of 500Hz  and reviewed with a high frequency filter of 70Hz  and a low frequency filter of 1Hz . EEG data were recorded continuously and digitally stored. Description: EEG showed continuous generalized 3 to 6 Hz theta-delta slowing. Generalized periodic discharges with triphasic morphology at 1.5 Hz were also noted. Hyperventilation and photic stimulation were  not performed.   ABNORMALITY - Periodic discharges with triphasic morphology, generalized ( GPDs) - Continuous slow, generalized IMPRESSION: This study showed generalized periodic discharges with triphasic morphology at 1.5 Hz which is on the ictal-interictal continuum with low potential for seizures. This EEG pattern can also be seen due to toxic-metabolic causes.  Additionally, this study is suggestive of moderate diffuse encephalopathy, nonspecific etiology. No seizures were seen throughout the recording. Priyanka Barbra Sarks   Overnight EEG with video  Result Date: 01/17/2021 Lora Havens, MD     01/17/2021 10:10 AM Patient Name: Adebowale Drayton MRN: [id number] Epilepsy Attending: Lora Havens Referring Physician/Provider: Dr Zeb Comfort Duration: 01/16/2021 1300 to 01/17/2021 0945  Patient history: 76 year old male with Alzheimer's disease presented with new onset seizures. EEG to evaluate for seizure  Level of alertness: awake, asleep  AEDs during EEG study: VPA  Technical aspects: This EEG study was done with scalp electrodes positioned according to the 10-20 International system of electrode placement. Electrical activity was acquired at a sampling rate of 500Hz  and reviewed with a high frequency filter of 70Hz  and a low frequency filter of 1Hz . EEG data were recorded continuously and digitally stored.  Description: During awake state, no clear posterior dominant rhythm was seen. Sleep was characterized by vertex waves, sleep spindles (12 to 14 Hz), maximal frontocentral region. EEG showed continuous generalized 3 to 6 Hz theta-delta slowing.  Intermittent generalized periodic discharges with triphasic morphology at 1 to 1.5 Hz were also noted, more prominent when awake/stimulated. Hyperventilation and photic stimulation were not performed.    ABNORMALITY - Periodic discharges with triphasic morphology, generalized ( GPDs) - Continuous slow, generalized  IMPRESSION: This study showed generalized periodic  discharges with triphasic morphology at 1-1.5 Hz.  This EEG pattern can be on the ictal-interictal continuum.  However, the morphology and frequency of discharges as well as reactivity to stimulation is most commonly due to toxic-metabolic causes.   Additionally, this study is suggestive of moderate diffuse encephalopathy, nonspecific etiology. No seizures were seen throughout the recording.  Lora Havens   ECHOCARDIOGRAM COMPLETE  Result Date: 01/07/2021    ECHOCARDIOGRAM REPORT   Patient Name:   BLISS FESSEL Date of Exam: 01/07/2021 Medical Rec #:  [id number]   Height:       68.0 in Accession #:    [id number]  Weight:       142.0 lb Date of Birth:  Feb 04, 1945   BSA:          1.767 m Patient Age:    59 years    BP:           109/65 mmHg Patient Gender: M           HR:           75 bpm. Exam Location:  Inpatient Procedure: 2D Echo, Cardiac Doppler and Color Doppler Indications:    Pulmonary embolus  History:        Patient has no prior history of Echocardiogram examinations.  Risk Factors:Hypertension.  Sonographer:    Glo Herring Referring Phys: [id number] [place]  Sonographer Comments: Suboptimal apical window. IMPRESSIONS  1. Left ventricular ejection fraction, by estimation, is 60 to 65%. The left ventricle has normal function. The left ventricle has no regional wall motion abnormalities. Left ventricular diastolic parameters are consistent with Grade I diastolic dysfunction (impaired relaxation).  2. Severe hypokineisis of the RV free wall with preserved apical contractility (McConnell's sign) is present. Right ventricular systolic function is mildly reduced. The right ventricular size is normal.  3. The mitral valve is normal in structure. Mild mitral valve regurgitation. No evidence of mitral stenosis.  4. The aortic valve is tricuspid. There is mild calcification of the aortic valve. There is mild thickening of the aortic valve. Aortic valve regurgitation is trivial. Aortic  valve sclerosis/calcification is present, without any evidence of aortic stenosis.  5. The inferior vena cava is normal in size with greater than 50% respiratory variability, suggesting right atrial pressure of 3 mmHg. FINDINGS  Left Ventricle: Left ventricular ejection fraction, by estimation, is 60 to 65%. The left ventricle has normal function. The left ventricle has no regional wall motion abnormalities. The left ventricular internal cavity size was normal in size. There is  no left ventricular hypertrophy. Left ventricular diastolic parameters are consistent with Grade I diastolic dysfunction (impaired relaxation). Right Ventricle: Severe hypokineisis of the RV free wall with preserved apical contractility (McConnell's sign) is present. The right ventricular size is normal. No increase in right ventricular wall thickness. Right ventricular systolic function is mildly reduced. Left [facility]: Left atrial size was normal in size. Right [facility]: Right atrial size was normal in size. Pericardium: There is no evidence of pericardial effusion. Mitral Valve: The mitral valve is normal in structure. Mild mitral annular calcification. Mild mitral valve regurgitation. No evidence of mitral valve stenosis. Tricuspid Valve: The tricuspid valve is normal in structure. Tricuspid valve regurgitation is trivial. No evidence of tricuspid stenosis. Aortic Valve: The aortic valve is tricuspid. There is mild calcification of the aortic valve. There is mild thickening of the aortic valve. Aortic valve regurgitation is trivial. Aortic valve sclerosis/calcification is present, without any evidence of aortic stenosis. Aortic valve mean gradient measures 6.0 mmHg. Aortic valve peak gradient measures 11.7 mmHg. Aortic valve area, by VTI measures 1.60 cm. Pulmonic Valve: The pulmonic valve was normal in structure. Pulmonic valve regurgitation is mild. No evidence of pulmonic stenosis. Aorta: The aortic root and ascending aorta are  structurally normal, with no evidence of dilitation. Venous: The inferior vena cava is normal in size with greater than 50% respiratory variability, suggesting right atrial pressure of 3 mmHg. IAS/Shunts: No atrial level shunt detected by color flow Doppler.  LEFT VENTRICLE PLAX 2D LVIDd:         4.10 cm   Diastology LVIDs:         2.50 cm   LV e' medial:    5.44 cm/s LV PW:         1.10 cm   LV E/e' medial:  10.4 LV IVS:        1.10 cm   LV e' lateral:   6.96 cm/s LVOT diam:     2.10 cm   LV E/e' lateral: 8.1 LV SV:         52 LV SV Index:   29 LVOT Area:     3.46 cm  RIGHT VENTRICLE             IVC RV  S prime:     14.10 cm/s  IVC diam: 2.10 cm LEFT ATRIUM         Index LA diam:    2.90 cm 1.64 cm/m  AORTIC VALVE                     PULMONIC VALVE AV Area (Vmax):    1.59 cm      PV Vmax:       0.74 m/s AV Area (Vmean):   1.61 cm      PV Peak grad:  2.2 mmHg AV Area (VTI):     1.60 cm AV Vmax:           171.00 cm/s AV Vmean:          111.000 cm/s AV VTI:            0.324 m AV Peak Grad:      11.7 mmHg AV Mean Grad:      6.0 mmHg LVOT Vmax:         78.30 cm/s LVOT Vmean:        51.700 cm/s LVOT VTI:          0.150 m LVOT/AV VTI ratio: 0.46  AORTA Ao Root diam: 3.80 cm Ao Asc diam:  3.30 cm MITRAL VALVE               TRICUSPID VALVE MV Area (PHT): 3.51 cm    TR Peak grad:   15.1 mmHg MV Decel Time: 216 msec    TR Vmax:        194.00 cm/s MV E velocity: 56.60 cm/s MV A velocity: 72.00 cm/s  SHUNTS MV E/A ratio:  0.79        Systemic VTI:  0.15 m                            Systemic Diam: 2.10 cm Dani Gobble Croitoru MD Electronically signed by Sanda Klein MD Signature Date/Time: 01/07/2021/1:07:03 PM    Final    CT Maxillofacial Wo Contrast  Result Date: 01/02/2021 CLINICAL DATA:  Provided history: Head trauma, minor. Facial trauma, blunt. Neck trauma. Additional history provided: Fall, laceration to chin and lip. EXAM: CT HEAD WITHOUT CONTRAST CT MAXILLOFACIAL WITHOUT CONTRAST CT CERVICAL SPINE WITHOUT CONTRAST  TECHNIQUE: Multidetector CT imaging of the head, cervical spine, and maxillofacial structures were performed using the standard protocol without intravenous contrast. Multiplanar CT image reconstructions of the cervical spine and maxillofacial structures were also generated. COMPARISON:  No pertinent prior exams available for comparison. FINDINGS: CT HEAD FINDINGS Brain: Moderate generalized cerebral atrophy. Comparatively mild cerebellar atrophy. Commensurate prominence of the ventricles and sulci. Mild ill-defined hypoattenuation within the cerebral white matter, nonspecific but compatible with chronic small vessel ischemic disease. There is no acute intracranial hemorrhage. No demarcated cortical infarct. No extra-axial fluid collection. No evidence of an intracranial mass. No midline shift. Vascular: No hyperdense vessel. Atherosclerotic calcifications. Skull: Normal. Negative for fracture or focal lesion. Other: No significant mastoid effusion. CT MAXILLOFACIAL FINDINGS Osseous: No acute maxillofacial fracture is identified. Orbits: No acute or significant orbital finding. Sinuses: Tiny mucous retention cyst and trace mucosal thickening within the right maxillary sinus. Trace mucosal thickening within the left maxillary sinus. Fluid opacification of the right ethmoid air cell. Soft tissues: No definite soft tissue swelling/hematoma appreciable by CT. CT CERVICAL SPINE FINDINGS Alignment: Mild cervicothoracic levocurvature. No significant spondylolisthesis. Skull base and vertebrae: The basion-dental and atlanto-dental intervals  are maintained.No evidence of acute fracture to the cervical spine. At C4-C5, there is fusion across the disc space and facet joint ankylosis. Multilevel ventral osteophytes. Soft tissues and spinal canal: No prevertebral fluid or swelling. No visible canal hematoma. Disc levels: Cervical spondylosis with multilevel disc space narrowing, disc bulges/central disc protrusions, posterior  disc osteophytes, endplate spurring, uncovertebral hypertrophy and facet arthrosis. No appreciable high-grade spinal canal stenosis. Multilevel bony neural foraminal narrowing. Upper chest: No consolidation within the imaged lung apices. No visible pneumothorax. Other: Calcified plaque within the visualized proximal major branch vessels of the neck and both carotid arteries. IMPRESSION: CT head: 1. No evidence of acute intracranial abnormality. 2. Mild chronic small vessel ischemic changes within the cerebral white matter. 3. Moderate generalized cerebral atrophy. Comparatively mild cerebellar atrophy. CT maxillofacial: 1. No evidence of acute maxillofacial fracture. 2. Mild paranasal sinus disease, as described. CT cervical spine: 1. No evidence of acute fracture to the cervical spine. 2. Mild cervicothoracic levocurvature. 3. Cervical spondylosis, as described. 4. C4-C5 ankylosis. Electronically Signed   By: Kellie Simmering D.O.   On: 01/02/2021 13:22     Time coordinating discharge: Over 59 minutes    Dwyane Dee, MD  [facility] 01/17/2021, 1:44 PM

## 2021-01-17 NOTE — Procedures (Addendum)
Patient Name: Jonathan Berry  MRN: [id number]  Epilepsy Attending: Charlsie Quest  Referring Physician/Provider: Dr Lindie Spruce Duration: 01/16/2021 1300 to 01/17/2021 0945   Patient history: 76 year old male with Alzheimer's disease presented with new onset seizures. EEG to evaluate for seizure   Level of alertness: awake, asleep   AEDs during EEG study: VPA   Technical aspects: This EEG study was done with scalp electrodes positioned according to the 10-20 International system of electrode placement. Electrical activity was acquired at a sampling rate of 500Hz  and reviewed with a high frequency filter of 70Hz  and a low frequency filter of 1Hz . EEG data were recorded continuously and digitally stored.    Description: During awake state, no clear posterior dominant rhythm was seen. Sleep was characterized by vertex waves, sleep spindles (12 to 14 Hz), maximal frontocentral region. EEG showed continuous generalized 3 to 6 Hz theta-delta slowing.  Intermittent generalized periodic discharges with triphasic morphology at 1 to 1.5 Hz were also noted, more prominent when awake/stimulated. Hyperventilation and photic stimulation were not performed.      ABNORMALITY - Periodic discharges with triphasic morphology, generalized ( GPDs) - Continuous slow, generalized   IMPRESSION: This study showed generalized periodic discharges with triphasic morphology at 1-1.5 Hz.  This EEG pattern can be on the ictal-interictal continuum.  However, the morphology and frequency of discharges as well as reactivity to stimulation is most commonly due to toxic-metabolic causes.     Additionally, this study is suggestive of moderate diffuse encephalopathy, nonspecific etiology. No seizures were seen throughout the recording.    Abbigale Mcelhaney 

## 2021-01-17 NOTE — Progress Notes (Signed)
LTM maint complete - no skin breakdown [facility] monitored, Event button test confirmed by [facility]. ? ?

## 2021-01-17 NOTE — Progress Notes (Addendum)
Subjective: No clinical seizures overnight.  Patient is awake, answering questions, pleasantly confused.  He is at baseline per wife.  ROS: negative except above  Examination  Vital signs in last 24 hours: Temp:  [97.5 F (36.4 C)-99.3 F (37.4 C)] 98.5 F (36.9 C) (01/05 0819) Pulse Rate:  [64-91] 89 (01/05 0819) Resp:  [15-25] 15 (01/05 0819) BP: (114-171)/(67-135) 171/72 (01/05 0819) SpO2:  [93 %-99 %] 99 % (01/05 0819)  General: lying in bed, NAD CVS: pulse-normal rate and rhythm RS: breathing comfortably, CTAB Extremities: warn, no edema Neuro: Awake, alert, oriented to self, not oriented to other people (this is baseline for right), follows simple commands, cranial nerves II to XII appear grossly intact, antigravity strength in all 4 extremities  Basic Metabolic Panel: Recent Labs  Lab 01/15/21 1345 01/15/21 2227 01/17/21 0449  NA 141 142 139  K 4.1 3.7 3.5  CL 107  --  105  CO2 25  --  27  GLUCOSE 102*  --  89  BUN 8  --  5*  CREATININE 0.96  --  0.82  CALCIUM 9.3  --  8.6*  MG 2.2  --  2.1    CBC: Recent Labs  Lab 01/15/21 1345 01/15/21 2227 01/17/21 0449  WBC 8.9  --  11.6*  NEUTROABS 7.4  --  8.7*  HGB 12.7* 11.9* 11.9*  HCT 39.5 35.0* 36.4*  MCV 89.0  --  87.5  PLT 421*  --  398     Coagulation Studies: No results for input(s): LABPROT, INR in the last 72 hours.  Imaging Reviewed: No new brain imaging overnight  ASSESSMENT AND PLAN: 76 year old male with Alzheimer's disease presented with new onset seizures.   New onset seizures -No clear provoking factors.   -Alzheimer's disease does put patient at increased risk for seizure recurrence.    Recommendations -Continue Depakote 500 mg twice daily -DC LTM as no seizures overnight -Continue seizure precautions -Follow-up with neurology in 2 to 3 months after discharge -Discussed plan with patient's wife at bedside and Dr. Sabino Gasser via secure chat  Seizure precautions: Per [facility] statutes, patients with seizures are not allowed to drive until they have been seizure-free for six months and cleared by a physician    Use caution when using heavy equipment or power tools. Avoid working on ladders or at heights. Take showers instead of baths. Ensure the water temperature is not too high on the home water heater. Do not go swimming alone. Do not lock yourself in a room alone (i.e. bathroom). When caring for infants or small children, sit down when holding, feeding, or changing them to minimize risk of injury to the child in the event you have a seizure. Maintain good sleep hygiene. Avoid alcohol.    If patient has another seizure, call 911 and bring them back to the ED if: A.  The seizure lasts longer than 5 minutes.      B.  The patient doesn't wake shortly after the seizure or has new problems such as difficulty seeing, speaking or moving following the seizure C.  The patient was injured during the seizure D.  The patient has a temperature over 102 F (39C) E.  The patient vomited during the seizure and now is having trouble breathing    During the Seizure   - First, ensure adequate ventilation and place patients on the floor on their left side  Loosen clothing around the neck and ensure the airway is patent. If  the patient is clenching the teeth, do not force the mouth open with any object as this can cause severe damage - Remove all items from the surrounding that can be hazardous. The patient may be oblivious to what's happening and may not even know what he or she is doing. If the patient is confused and wandering, either gently guide him/her away and block access to outside areas - Reassure the individual and be comforting - Call 911. In most cases, the seizure ends before EMS arrives. However, there are cases when seizures may last over 3 to 5 minutes. Or the individual may have developed breathing difficulties or severe injuries. If a pregnant patient or a person with  diabetes develops a seizure, it is prudent to call an ambulance. - Finally, if the patient does not regain full consciousness, then call EMS. Most patients will remain confused for about 45 to 90 minutes after a seizure, so you must use judgment in calling for help. - Avoid restraints but make sure the patient is in a bed with padded side rails - Place the individual in a lateral position with the neck slightly flexed; this will help the saliva drain from the mouth and prevent the tongue from falling backward - Remove all nearby furniture and other hazards from the area - Provide verbal assurance as the individual is regaining consciousness - Provide the patient with privacy if possible - Call for help and start treatment as ordered by the caregiver    After the Seizure (Postictal Stage)   After a seizure, most patients experience confusion, fatigue, muscle pain and/or a headache. Thus, one should permit the individual to sleep. For the next few days, reassurance is essential. Being calm and helping reorient the person is also of importance.   Most seizures are painless and end spontaneously. Seizures are not harmful to others but can lead to complications such as stress on the lungs, brain and the heart. Individuals with prior lung problems may develop labored breathing and respiratory distress.     I have spent a total of 26 minutes with the patient reviewing hospital notes,  test results, labs and examining the patient as well as establishing an assessment and plan.  > 50% of time was spent in direct patient care.  Zeb Comfort Epilepsy [facility] For questions after 5pm please refer to AMION to reach the Neurologist on call

## 2021-01-17 NOTE — NC FL2 (Signed)
[place] LEVEL OF CARE SCREENING TOOL     IDENTIFICATION  Patient Name: Jonathan Berry Birthdate: [birth year] Sex: male Admission Date (Current Location): 01/15/2021  [facility] and [place] Number:  Herbalist and Address:  [facility], [place] [address] [place], [place] [address]      Provider Number: [id number]  Attending Physician Name and Address:  Dwyane Dee, MD  Relative Name and Phone Number:       Current Level of Care: Hospital Recommended Level of Care: Memory Care Prior Approval Number:    Date Approved/Denied:   PASRR Number:    Discharge Plan: Other (Comment) (Memory Care)    Current Diagnoses: Patient Active Problem List   Diagnosis Date Noted   Alzheimer disease [facility])    Seizure ([place]) 01/15/2021   Protein-calorie malnutrition, severe 01/08/2021   Pulmonary emboli ([place]) 01/06/2021   Pneumonia due to COVID-19 virus 04/25/2020   ARF (acute renal failure) ([place]) 04/25/2020   DNR (do not resuscitate) 04/25/2020   Essential hypertension 04/25/2020    Orientation RESPIRATION BLADDER Height & Weight     Self  Normal Incontinent Weight:   Height:     BEHAVIORAL SYMPTOMS/MOOD NEUROLOGICAL BOWEL NUTRITION STATUS      Incontinent Diet (regular)  AMBULATORY STATUS COMMUNICATION OF NEEDS Skin   Limited Assist Verbally Normal                       Personal Care Assistance Level of Assistance  Bathing, Feeding, Dressing Bathing Assistance: Limited assistance Feeding assistance: Limited assistance Dressing Assistance: Limited assistance     Functional Limitations Info             SPECIAL CARE FACTORS FREQUENCY                       Contractures Contractures Info: Not present    Additional Factors Info  Code Status, Allergies, Psychotropic Code Status Info: DNR Allergies Info: NKA Psychotropic Info: Aricept 76m daily at bed; Remeron 112mdaily at bed; Seroquel 2542maily; Zoloft 94m44maily         Current Medications (01/17/2021):  This is the current hospital active medication list Current Facility-Administered Medications  Medication Dose Route Frequency Provider Last Rate Last Admin   0.9 %  sodium chloride infusion  75 mL/hr Intravenous Continuous ZhanWynetta FinesMD 75 mL/hr at 01/17/21 0200 75 mL/hr at 01/17/21 0200   apixaban (ELIQUIS) tablet 5 mg  5 mg Oral BID GirgDwyane Dee   5 mg at 01/17/21 1226   chlorhexidine gluconate (MEDLINE KIT) (PERIDEX) 0.12 % solution 15 mL  15 mL Mouth Rinse BID ZhanWynetta FinesMD   15 mL at 01/16/21 2159   donepezil (ARICEPT) tablet 10 mg  10 mg Oral QHS ZhanWynetta FinesMD   10 mg at 01/16/21 2200   LORazepam (ATIVAN) injection 4 mg  4 mg Intravenous Q5 Min x 2 PRN ZhanWynetta FinesMD       LORazepam (ATIVAN) tablet 0.5 mg  0.5 mg Oral BID PRN ZhanWynetta FinesMD       MEDLINE mouth rinse  15 mL Mouth Rinse 10 times per day ZhanWynetta FinesMD   15 mL at 01/17/21 1222   mirtazapine (REMERON) tablet 15 mg  15 mg Oral QHS ZhanWynetta FinesMD   15 mg at 01/16/21 2200   QUEtiapine (SEROQUEL) tablet 25 mg  25  mg Oral Daily Wynetta Fines T, MD   25 mg at 01/17/21 1220   sertraline (ZOLOFT) tablet 25 mg  25 mg Oral Daily Wynetta Fines T, MD   25 mg at 01/17/21 1220   valproic acid (DEPAKENE) 250 MG capsule 500 mg  500 mg Oral BID Dwyane Dee, MD   500 mg at 01/17/21 1220     Discharge Medications: apixaban 5 MG Tabs tablet Commonly known as: ELIQUIS Take 1 tablet (5 mg total) by mouth 2 (two) times daily. Replaces: Apixaban Starter Pack (50m and 575m    divalproex 250 MG DR tablet Commonly known as: Depakote Take 2 tablets (500 mg total) by mouth 2 (two) times daily.    donepezil 10 MG tablet Commonly known as: ARICEPT Take 10 mg by mouth at bedtime.    LORazepam 1 MG tablet Commonly known as: ATIVAN Take 0.5 tablets (0.5 mg total) by mouth 2 (two) times daily as needed for anxiety.    mirtazapine 15 MG tablet Commonly known as:  REMERON Take 15 mg by mouth at bedtime.    QUEtiapine 25 MG tablet Commonly known as: SEROQUEL Take 25 mg by mouth daily.    sertraline 25 MG tablet Commonly known as: ZOLOFT Take 25 mg by mouth daily.    Relevant Imaging Results:  Relevant Lab Results:   Additional In[place]CSW

## 2021-01-17 NOTE — TOC Transition Note (Signed)
Transition of Care [facility]) - CM/SW Discharge Note   Patient Details  Name: Jonathan Berry MRN: [id number] Date of Birth: [birth year]  [facility]) CM/SW Contact:  Baldemar Lenis, LCSW Phone Number: 01/17/2021, 3:48 PM   Clinical Narrative:    CSW attempted to contact [facility] to inform about discharge, left a voicemail and never received a call back. CSW discussed with wife at bedside, she will take all documents herself and transport patient back to memory care. Wife to hire caregivers for the next couple of days to stay with the patient, and agreeable to resuming PT with [facility]. CSW sent orders to [facility], printed out all discharge documentation for wife to take.   Nurse to call report to [phone].    Final next level of care: Memory Care Barriers to Discharge: Barriers Resolved   Patient Goals and CMS Choice        Discharge Placement              Patient chooses bed at: [facility] Patient to be transferred to facility by: Family Name of family member notified: [place] Patient and family notified of of transfer: 01/17/21  Discharge Plan and Services                          HH Arranged: PT [facility] Agency: Other - See comment International aid/development worker) Date HH Agency Contacted: 01/17/21   Representative spoke with at [facility] Agency: Rene Kocher  Social Determinants of Health (SDOH) Interventions     Readmission Risk Interventions No flowsheet data found.

## 2021-01-17 NOTE — Progress Notes (Signed)
LTM EEG discontinued - no skin breakdown at unhook.   

## 2021-04-08 ENCOUNTER — Other Ambulatory Visit: Payer: Self-pay

## 2021-04-08 ENCOUNTER — Encounter (HOSPITAL_COMMUNITY): Payer: Self-pay

## 2021-04-08 ENCOUNTER — Emergency Department (HOSPITAL_COMMUNITY)
Admission: EM | Admit: 2021-04-08 | Discharge: 2021-04-08 | Disposition: A | Discharge status: Home / Self Care | Payer: Medicare Other | Attending: Emergency Medicine | Admitting: Emergency Medicine

## 2021-04-08 DIAGNOSIS — Z7901 Long term (current) use of anticoagulants: Secondary | ICD-10-CM | POA: Diagnosis not present

## 2021-04-08 DIAGNOSIS — I10 Essential (primary) hypertension: Secondary | ICD-10-CM | POA: Insufficient documentation

## 2021-04-08 DIAGNOSIS — Z8616 Personal history of COVID-19: Secondary | ICD-10-CM | POA: Diagnosis not present

## 2021-04-08 DIAGNOSIS — G309 Alzheimer's disease, unspecified: Secondary | ICD-10-CM | POA: Diagnosis not present

## 2021-04-08 DIAGNOSIS — G40909 Epilepsy, unspecified, not intractable, without status epilepticus: Secondary | ICD-10-CM | POA: Insufficient documentation

## 2021-04-08 DIAGNOSIS — R569 Unspecified convulsions: Secondary | ICD-10-CM | POA: Diagnosis present

## 2021-04-08 HISTORY — DX: Unspecified convulsions: R56.9

## 2021-04-08 LAB — CBC
HCT: 39.7 % (ref 39.0–52.0)
Hemoglobin: 13.2 g/dL (ref 13.0–17.0)
MCH: 29.9 pg (ref 26.0–34.0)
MCHC: 33.2 g/dL (ref 30.0–36.0)
MCV: 90 fL (ref 80.0–100.0)
Platelets: 259 10*3/uL (ref 150–400)
RBC: 4.41 MIL/uL (ref 4.22–5.81)
RDW: 13.2 % (ref 11.5–15.5)
WBC: 12.8 10*3/uL — ABNORMAL HIGH (ref 4.0–10.5)
nRBC: 0 % (ref 0.0–0.2)

## 2021-04-08 LAB — COMPREHENSIVE METABOLIC PANEL
ALT: 20 U/L (ref 0–44)
AST: 21 U/L (ref 15–41)
Albumin: 3.7 g/dL (ref 3.5–5.0)
Alkaline Phosphatase: 67 U/L (ref 38–126)
Anion gap: 9 (ref 5–15)
BUN: 15 mg/dL (ref 8–23)
CO2: 23 mmol/L (ref 22–32)
Calcium: 9.1 mg/dL (ref 8.9–10.3)
Chloride: 107 mmol/L (ref 98–111)
Creatinine, Ser: 0.93 mg/dL (ref 0.61–1.24)
GFR, Estimated: >60 mL/min (ref >60)
Glucose, Bld: 104 mg/dL — ABNORMAL HIGH (ref 70–99)
Potassium: 4.1 mmol/L (ref 3.5–5.1)
Sodium: 139 mmol/L (ref 135–145)
Total Bilirubin: 1 mg/dL (ref 0.3–1.2)
Total Protein: 6.2 g/dL — ABNORMAL LOW (ref 6.5–8.1)

## 2021-04-08 MED ORDER — SODIUM CHLORIDE 0.9 % IV SOLN
100.0000 mg | Freq: Once | INTRAVENOUS | Status: AC
  Administered 2021-04-08: 100 mg via INTRAVENOUS
  Filled 2021-04-08: qty 10

## 2021-04-08 MED ORDER — LACOSAMIDE 50 MG PO TABS: 50.0000 mg | ORAL_TABLET | Freq: Two times a day (BID) | ORAL | 0 refills | Status: AC

## 2021-04-08 NOTE — Discharge Instructions (Addendum)
Follow-up with one of the neurology groups. °

## 2021-04-08 NOTE — ED Triage Notes (Signed)
Pt BIB from [facility] with seizures at 2330 and another at 0600. Hx seizure in the past 6 months. Pt on Depakote and wife had him taken off 2 months ago. Pt has tremors at baseline and ambulatory with alzheimer's.  ? ?122/56 ?76 Hr  ?109 CBG  ?96% 20 g ?

## 2021-04-08 NOTE — ED Triage Notes (Signed)
Pt is only alert to self babbles at baseline but per wife occasionally will answer simple questions but you still don't know if he is giving the right answer, for instance wife states he might say he is hot when really he is cold.  ?

## 2021-04-09 NOTE — ED Provider Notes (Signed)
?[facility] EMERGENCY DEPARTMENT ?Provider Note ? ? ?CSN: [id number] ?Arrival date & time: 04/08/21  0867 ? ?  ? ?History ? ?Chief Complaint  ?Patient presents with  ? Seizures  ? ? ?Rollyn Scialdone is a 76 y.o. male. ? ? ?Seizures ?Patient with history of dementia.  Also history of seizures.  Had seizure at home.  Had 1 last night and 1 again in the morning.  Has had seizures in the last 6 months.  Had been on Depakote but wife took him off because he was too sedated.  Also previously had been on Keppra but also to sedate.  No injury.  Has been doing well otherwise.  Most of the history comes from the patient's wife what she arrives later ?  ?Past Medical History:  ?Diagnosis Date  ? Alzheimer disease (HCC)   ? COVID   ? Hypercholesteremia   ? Hypertension   ? Seizures (HCC)   ? ? ?Home Medications ?Prior to Admission medications   ?Medication Sig Start Date End Date Taking? Authorizing Provider  ?apixaban (ELIQUIS) 5 MG TABS tablet Take 1 tablet (5 mg total) by mouth 2 (two) times daily. 01/17/21  Yes Lewie Chamber, MD  ?donepezil (ARICEPT) 10 MG tablet Take 10 mg by mouth at bedtime.   Yes [provider]  ?lacosamide (VIMPAT) 50 MG TABS tablet Take 1 tablet (50 mg total) by mouth 2 (two) times daily. 04/08/21  Yes Benjiman Core, MD  ?LORazepam (ATIVAN) 1 MG tablet Take 0.5 tablets (0.5 mg total) by mouth 2 (two) times daily as needed for anxiety. 01/10/21  Yes Marinda Elk, MD  ?sertraline (ZOLOFT) 25 MG tablet Take 25 mg by mouth daily.   Yes [provider]  ?divalproex (DEPAKOTE) 250 MG DR tablet Take 2 tablets (500 mg total) by mouth 2 (two) times daily for 3 days. ?Patient not taking: Reported on 04/08/2021 01/17/21 01/20/21  Lewie Chamber, MD  ?   ? ?Allergies    ?Patient has no known allergies.   ? ?Review of Systems   ?Review of Systems  ?Constitutional:  Negative for appetite change.  ?Respiratory:  Negative for shortness of breath.   ?Neurological:  Positive for  seizures.  ? ?Physical Exam ?Updated Vital Signs ?BP 134/71   Pulse 71   Temp 97.9 ?F (36.6 ?C) (Oral)   Resp 18   Ht 5\' 8"  (1.727 m)   SpO2 95%   BMI 20.25 kg/m?  ?Physical Exam ?Vitals and nursing note reviewed.  ?HENT:  ?   Head: Normocephalic.  ?Cardiovascular:  ?   Rate and Rhythm: Normal rate.  ?Neurological:  ?   Mental Status: He is alert.  ?   Comments: Awake and initial confusion but later returned to baseline.  ? ? ?ED Results / Procedures / Treatments   ?Labs ?(all labs ordered are listed, but only abnormal results are displayed) ?Labs Reviewed  ?COMPREHENSIVE METABOLIC PANEL - Abnormal; Notable for the following components:  ?    Result Value  ? Glucose, Bld 104 (*)   ? Total Protein 6.2 (*)   ? All other components within normal limits  ?CBC - Abnormal; Notable for the following components:  ? WBC 12.8 (*)   ? All other components within normal limits  ? ? ?EKG ?None ? ?Radiology ?No results found. ? ?Procedures ?Procedures  ? ? ?Medications Ordered in ED ?Medications  ?lacosamide (VIMPAT) 100 mg in sodium chloride 0.9 % 25 mL IVPB (0 mg Intravenous Stopped 04/08/21  2979)  ? ? ?ED Course/ Medical Decision Making/ A&P ?  ?                        ?Medical Decision Making ?Amount and/or Complexity of Data Reviewed ?Labs: ordered. ? ?Risk ?Prescription drug management. ? ? ?Patient presented with seizure.  History of same.  Not currently on seizure medicines due to side effects of sedation.  Discussed over phone with Dr. Selina Cooley.  We determined that likely the best course of action would either be a lower dose Keppra or starting Vimpat.  Discussed with wife and will start Vimpat.  Had IV load and patient tolerated well.  Not really sedated.  Will give 50 mg of Vimpat twice a day and have follow-up with neurology.  Known seizure disorder.  Lab work reviewed and reassuring.  Will not further evaluate the cause of the seizure.  Discharge home ? ? ? ? ? ? ? ?Final Clinical Impression(s) / ED  Diagnoses ?Final diagnoses:  ?Seizure (HCC)  ? ? ?Rx / DC Orders ?ED Discharge Orders   ? ?      Ordered  ?  lacosamide (VIMPAT) 50 MG TABS tablet  2 times daily       ? 04/08/21 1130  ? ?  ?  ? ?  ? ? ?  ?Benjiman Core, MD ?04/09/21 0715 ? ?

## 2021-09-15 ENCOUNTER — Encounter (HOSPITAL_COMMUNITY): Payer: Self-pay | Admitting: Emergency Medicine

## 2021-09-15 ENCOUNTER — Other Ambulatory Visit: Payer: Self-pay

## 2021-09-15 ENCOUNTER — Emergency Department (HOSPITAL_COMMUNITY)
Admission: EM | Admit: 2021-09-15 | Discharge: 2021-09-16 | Disposition: A | Discharge status: Home / Self Care | Payer: Medicare Other | Attending: Emergency Medicine | Admitting: Emergency Medicine

## 2021-09-15 ENCOUNTER — Emergency Department (HOSPITAL_COMMUNITY): Payer: Medicare Other

## 2021-09-15 DIAGNOSIS — G40909 Epilepsy, unspecified, not intractable, without status epilepticus: Secondary | ICD-10-CM | POA: Diagnosis not present

## 2021-09-15 DIAGNOSIS — Y900 Blood alcohol level of less than 20 mg/100 ml: Secondary | ICD-10-CM | POA: Insufficient documentation

## 2021-09-15 DIAGNOSIS — Y92002 Bathroom of unspecified non-institutional (private) residence single-family (private) house as the place of occurrence of the external cause: Secondary | ICD-10-CM | POA: Insufficient documentation

## 2021-09-15 DIAGNOSIS — I10 Essential (primary) hypertension: Secondary | ICD-10-CM | POA: Diagnosis not present

## 2021-09-15 DIAGNOSIS — Z7901 Long term (current) use of anticoagulants: Secondary | ICD-10-CM | POA: Diagnosis not present

## 2021-09-15 DIAGNOSIS — F0280 Dementia in other diseases classified elsewhere without behavioral disturbance: Secondary | ICD-10-CM | POA: Insufficient documentation

## 2021-09-15 DIAGNOSIS — S0990XA Unspecified injury of head, initial encounter: Secondary | ICD-10-CM

## 2021-09-15 DIAGNOSIS — W182XXA Fall in (into) shower or empty bathtub, initial encounter: Secondary | ICD-10-CM | POA: Diagnosis not present

## 2021-09-15 DIAGNOSIS — K5730 Diverticulosis of large intestine without perforation or abscess without bleeding: Secondary | ICD-10-CM | POA: Diagnosis not present

## 2021-09-15 DIAGNOSIS — Y93E1 Activity, personal bathing and showering: Secondary | ICD-10-CM | POA: Diagnosis not present

## 2021-09-15 DIAGNOSIS — Z23 Encounter for immunization: Secondary | ICD-10-CM | POA: Diagnosis not present

## 2021-09-15 DIAGNOSIS — G309 Alzheimer's disease, unspecified: Secondary | ICD-10-CM | POA: Diagnosis not present

## 2021-09-15 DIAGNOSIS — W19XXXA Unspecified fall, initial encounter: Secondary | ICD-10-CM

## 2021-09-15 DIAGNOSIS — R569 Unspecified convulsions: Secondary | ICD-10-CM

## 2021-09-15 DIAGNOSIS — S0101XA Laceration without foreign body of scalp, initial encounter: Secondary | ICD-10-CM | POA: Insufficient documentation

## 2021-09-15 DIAGNOSIS — Z20822 Contact with and (suspected) exposure to covid-19: Secondary | ICD-10-CM | POA: Insufficient documentation

## 2021-09-15 DIAGNOSIS — Z79899 Other long term (current) drug therapy: Secondary | ICD-10-CM | POA: Insufficient documentation

## 2021-09-15 LAB — CBC
HCT: 39.2 % (ref 39.0–52.0)
Hemoglobin: 13.2 g/dL (ref 13.0–17.0)
MCH: 29.9 pg (ref 26.0–34.0)
MCHC: 33.7 g/dL (ref 30.0–36.0)
MCV: 88.7 fL (ref 80.0–100.0)
Platelets: 263 10*3/uL (ref 150–400)
RBC: 4.42 MIL/uL (ref 4.22–5.81)
RDW: 12.6 % (ref 11.5–15.5)
WBC: 8.9 10*3/uL (ref 4.0–10.5)
nRBC: 0 % (ref 0.0–0.2)

## 2021-09-15 LAB — URINALYSIS, ROUTINE W REFLEX MICROSCOPIC
Bilirubin Urine: NEGATIVE
Glucose, UA: NEGATIVE mg/dL
Hgb urine dipstick: NEGATIVE
Ketones, ur: 5 mg/dL — AB
Leukocytes,Ua: NEGATIVE
Nitrite: NEGATIVE
Protein, ur: NEGATIVE mg/dL
Specific Gravity, Urine: 1.018 (ref 1.005–1.030)
pH: 6 (ref 5.0–8.0)

## 2021-09-15 LAB — COMPREHENSIVE METABOLIC PANEL
ALT: 17 U/L (ref 0–44)
AST: 23 U/L (ref 15–41)
Albumin: 3.5 g/dL (ref 3.5–5.0)
Alkaline Phosphatase: 68 U/L (ref 38–126)
Anion gap: 13 (ref 5–15)
BUN: 18 mg/dL (ref 8–23)
CO2: 20 mmol/L — ABNORMAL LOW (ref 22–32)
Calcium: 8.9 mg/dL (ref 8.9–10.3)
Chloride: 105 mmol/L (ref 98–111)
Creatinine, Ser: 1.03 mg/dL (ref 0.61–1.24)
GFR, Estimated: >60 mL/min (ref >60)
Glucose, Bld: 115 mg/dL — ABNORMAL HIGH (ref 70–99)
Potassium: 3.6 mmol/L (ref 3.5–5.1)
Sodium: 138 mmol/L (ref 135–145)
Total Bilirubin: 0.4 mg/dL (ref 0.3–1.2)
Total Protein: 5.8 g/dL — ABNORMAL LOW (ref 6.5–8.1)

## 2021-09-15 LAB — I-STAT CHEM 8, ED
BUN: 16 mg/dL (ref 8–23)
Calcium, Ion: 1.16 mmol/L (ref 1.15–1.40)
Chloride: 104 mmol/L (ref 98–111)
Creatinine, Ser: 0.9 mg/dL (ref 0.61–1.24)
Glucose, Bld: 111 mg/dL — ABNORMAL HIGH (ref 70–99)
HCT: 40 % (ref 39.0–52.0)
Hemoglobin: 13.6 g/dL (ref 13.0–17.0)
Potassium: 3.7 mmol/L (ref 3.5–5.1)
Sodium: 140 mmol/L (ref 135–145)
TCO2: 21 mmol/L — ABNORMAL LOW (ref 22–32)

## 2021-09-15 LAB — SAMPLE TO BLOOD BANK

## 2021-09-15 LAB — LACTIC ACID, PLASMA: Lactic Acid, Venous: <0.3 mmol/L — ABNORMAL LOW (ref 0.5–1.9)

## 2021-09-15 LAB — PROTIME-INR
INR: 1.1 (ref 0.8–1.2)
Prothrombin Time: 14.4 seconds (ref 11.4–15.2)

## 2021-09-15 LAB — RESP PANEL BY RT-PCR (FLU A&B, COVID) ARPGX2
Influenza A by PCR: NEGATIVE
Influenza B by PCR: NEGATIVE
SARS Coronavirus 2 by RT PCR: NEGATIVE

## 2021-09-15 LAB — ETHANOL: Alcohol, Ethyl (B): <10 mg/dL (ref <10)

## 2021-09-15 LAB — VALPROIC ACID LEVEL: Valproic Acid Lvl: <10 ug/mL — ABNORMAL LOW (ref 50.0–100.0)

## 2021-09-15 MED ORDER — TETANUS-DIPHTH-ACELL PERTUSSIS 5-2.5-18.5 LF-MCG/0.5 IM SUSY
0.5000 mL | PREFILLED_SYRINGE | Freq: Once | INTRAMUSCULAR | Status: AC
  Administered 2021-09-15: 0.5 mL via INTRAMUSCULAR
  Filled 2021-09-15: qty 0.5

## 2021-09-15 MED ORDER — VALPROATE SODIUM 100 MG/ML IV SOLN
500.0000 mg | Freq: Once | INTRAVENOUS | Status: AC
  Administered 2021-09-15: 500 mg via INTRAVENOUS
  Filled 2021-09-15: qty 5

## 2021-09-15 MED ORDER — LORAZEPAM 1 MG PO TABS
1.0000 mg | ORAL_TABLET | Freq: Once | ORAL | Status: AC
  Administered 2021-09-15: 1 mg via ORAL
  Filled 2021-09-15: qty 1

## 2021-09-15 NOTE — ED Notes (Signed)
Patient transported to CT 

## 2021-09-15 NOTE — ED Triage Notes (Signed)
Pt BIB EMS from [facility], pt had unwitnessed seizure. Staff heard pt fall and came in to find pt seizing laying prone in the shower. Pt nonverbal at baseline, appears to be at baseline at this time per EMS. Hx of seizures, have been attempting to change medications. Pt taking Eliquis. Laceration noted to back of head c-collar in place.   EMS vitals: 140/70 HR 90 CBG 110 98% RA

## 2021-09-15 NOTE — Discharge Instructions (Addendum)
Your trauma imaging was negative for acute injury.  You had a roughly 3 cm laceration of the posterior scalp which was repaired with 5 staples.  These will need to remain in place for the next 10 days.  Watch for signs of developing infection.  You were loaded with Depakote given the concern for seizure at your facility.  Recommend outpatient neurology follow-up.

## 2021-09-15 NOTE — ED Provider Notes (Signed)
  [facility] EMERGENCY DEPARTMENT Provider Note   CSN: [id number] Arrival date & time: 09/15/21  1713     History {Add pertinent medical, surgical, social history, OB history to HPI:1} Chief Complaint  Patient presents with   Fall   Seizures    Jonathan Berry is a 76 y.o. male.   Fall  Seizures      Home Medications Prior to Admission medications   Medication Sig Start Date End Date Taking? Authorizing Provider  apixaban (ELIQUIS) 5 MG TABS tablet Take 1 tablet (5 mg total) by mouth 2 (two) times daily. 01/17/21   Lewie Chamber, MD  divalproex (DEPAKOTE) 250 MG DR tablet Take 2 tablets (500 mg total) by mouth 2 (two) times daily for 3 days. Patient not taking: Reported on 04/08/2021 01/17/21 01/20/21  Lewie Chamber, MD  donepezil (ARICEPT) 10 MG tablet Take 10 mg by mouth at bedtime.    [provider]  lacosamide (VIMPAT) 50 MG TABS tablet Take 1 tablet (50 mg total) by mouth 2 (two) times daily. 04/08/21   Benjiman Core, MD  LORazepam (ATIVAN) 1 MG tablet Take 0.5 tablets (0.5 mg total) by mouth 2 (two) times daily as needed for anxiety. 01/10/21   Marinda Elk, MD  sertraline (ZOLOFT) 25 MG tablet Take 25 mg by mouth daily.    [provider]      Allergies    Patient has no known allergies.    Review of Systems   Review of Systems  Neurological:  Positive for seizures.    Physical Exam Updated Vital Signs BP (!) 152/81 (BP Location: Right Arm)   Pulse 73   Temp 98.6 F (37 C) (Oral)   Resp 15   Ht 5\' 8"  (1.727 m)   Wt 60.4 kg   SpO2 99%   BMI 20.25 kg/m  Physical Exam  ED Results / Procedures / Treatments   Labs (all labs ordered are listed, but only abnormal results are displayed) Labs Reviewed - No data to display  EKG None  Radiology No results found.  Procedures Procedures  {Document cardiac monitor, telemetry assessment procedure when appropriate:1}  Medications Ordered in ED Medications - No  data to display  ED Course/ Medical Decision Making/ A&P                           Medical Decision Making  ***  {Document critical care time when appropriate:1} {Document review of labs and clinical decision tools ie heart score, Chads2Vasc2 etc:1}  {Document your independent review of radiology images, and any outside records:1} {Document your discussion with family members, caretakers, and with consultants:1} {Document social determinants of health affecting pt's care:1} {Document your decision making why or why not admission, treatments were needed:1} Final Clinical Impression(s) / ED Diagnoses Final diagnoses:  None    Rx / DC Orders ED Discharge Orders     None

## 2021-09-15 NOTE — ED Notes (Signed)
Wound on back of head cleaned and irrigated with saline.

## 2021-09-15 NOTE — ED Notes (Signed)
Trauma Response Nurse Documentation   Jonathan Berry is a 76 y.o. male arriving to [facility] ED via EMS  On Eliquis (apixaban) daily. Trauma was activated as a Level 2 by ED Charge RN based on the following trauma criteria Elderly patients > 65 with head trauma on anti-coagulation (excluding ASA).   Patient cleared for CT by Dr. Karene Fry. Pt transported to CT with trauma response nurse present to monitor. RN remained with the patient throughout their absence from the department for clinical observation.   GCS 11 (at baseline).  History   Past Medical History:  Diagnosis Date   Alzheimer disease (HCC)    COVID    Hypercholesteremia    Hypertension    Seizures (HCC)      Past Surgical History:  Procedure Laterality Date   KNEE SURGERY     MANDIBLE SURGERY        Initial Focused Assessment (If applicable, or please see trauma documentation): - C-collar in place - Controlled bleeding to top of head on right side. - PERRLA - GCS 11 (baseline due to advanced alzheimers)  - VSS  CT's Completed:   CT Head and CT C-Spine   Interventions:  - trauma labs - CT head and neck - CXR - Pelvic XR  Plan for disposition:  Other Unsure at this time.  Consults completed:  none at 1800 .  Event Summary: Pt is from [facility], had a unwitnessed seizure.  Staff heard the pt fall and came in and found pt seizing, laying prone in the shower.  Pt is on eliquis.  Nonverbal at baseline per EMS.  Hx of seizures.    Bedside handoff with ED RN Adrian Prows, Durwin Nora  Trauma Response RN  Please call TRN at [phone] for further assistance.

## 2021-09-15 NOTE — ED Notes (Signed)
Seizure precautions put in place, side rails padded w/ blankets, suction set up at bedside, oxygen set up at bedside.

## 2021-09-15 NOTE — ED Notes (Signed)
Pt transported to CT by TRN 

## 2021-09-15 NOTE — Progress Notes (Signed)
Orthopedic Tech Progress Note Patient Details:  Jonathan Berry [birth year] [id number]  Level 2 trauma  Patient ID: Jonathan Berry, male   DOB: May 04, 1945, 76 y.o.   MRN: [id number]  Docia Furl 09/15/2021, 6:16 PM

## 2021-09-16 NOTE — ED Notes (Signed)
Report given to nurse, Alcario Drought at [facility]. Pt taken back to facility via [facility]

## 2022-06-02 ENCOUNTER — Non-Acute Institutional Stay: Payer: Medicare Other | Admitting: Hospice

## 2022-06-02 DIAGNOSIS — Z515 Encounter for palliative care: Secondary | ICD-10-CM

## 2022-06-02 DIAGNOSIS — R569 Unspecified convulsions: Secondary | ICD-10-CM

## 2022-06-02 DIAGNOSIS — F32A Depression, unspecified: Secondary | ICD-10-CM

## 2022-06-02 DIAGNOSIS — F0390 Unspecified dementia without behavioral disturbance: Secondary | ICD-10-CM

## 2022-06-02 NOTE — Progress Notes (Signed)
Therapist, nutritional Palliative Care Consult Note Telephone: [phone]  Fax: [phone]  PATIENT NAME: Jonathan Berry [address] [place] [address] [phone] (home)  DOB: 03/26/1945 MRN: [id number]  PRIMARY CARE PROVIDER:    Ara Kussmaul NP  REFERRING PROVIDER:   Ara Kussmaul NP  RESPONSIBLE PARTY:    Contact Information     Name Relation Home Work [address]   [place], [place] Spouse [phone]  [phone]      I met face to face with patient in the facility. Visit to build trust and highlight Palliative Medicine as specialized medical care for people living with serious illness, aimed at facilitating better quality of life through symptoms relief, assisting with advance care planning and complex medical decision making.  Spouse- Gin is with patient during visit. Visit consisted of counseling and education dealing with the complex and emotionally intense issues of symptom management and palliative care in the setting of serious and potentially life-threatening illness. Palliative care team will continue to support patient, patient's family, and medical team.   Palliative care team will continue to support patient, patient's family, and medical team.  ASSESSMENT AND / RECOMMENDATIONS:  ------------------------------------------------------------------------------------------------------------------------------------------------- Advance Care Planning: Our advance care planning conversation included a discussion about:    The value and importance of advance care planning  Difference between Hospice and Palliative care Exploration of goals of care in the event of a sudden injury or illness  Identification and preparation of a healthcare agent  Review and updating or creation of an  advance directive document . Decision not to resuscitate or to de-escalate disease focused treatments due to poor prognosis.  CODE STATUS: Do Not  Resuscitate  Goals of Care: Goals include to maximize quality of life and symptom management.  Family is interested in hospice service in the future when patient qualifies for it.  I spent  20 minutes providing this initial consultation. More than 50% of the time in this consultation was spent on counseling patient and coordinating communication. --------------------------------------------------------------------------------------------------------------------------------------  Symptom Management/Plan: Althziemer's Dementia: Worsening memory loss/confusion, impoverished thought, limited language in line with dementia disease trajectory.  Incontinent of bowel and bladder, nonambulatory, fast 7A.  Continue Aricept as ordered.  Provide assistance with ADLs, provide cueing and redirection as needed.  Fall/safety precautions.  Seizure: Mini seizure seizure last week.  Monitor closely, seizure precautions.  Continue lacosamide as ordered. Routine CBC CMP  Depression: Managed with Zoloft.  Encourage socialization and participation in facility activities.  Psych consult as needed.  Follow up: Palliative care will continue to follow for complex medical decision making, advance care planning, and clarification of goals. Return 6 weeks or prn. Encouraged to call provider sooner with any concerns.   Family /Caregiver/Community Supports: Spouse lives close by and is involved in his care.  Strong family support system identified.  HOSPICE ELIGIBILITY/DIAGNOSIS: TBD  Chief Complaint: Initial Palliative care visit  HISTORY OF PRESENT ILLNESS:  Jonathan Berry is a 77 y.o. year old male  with multiple morbidities requiring close monitoring/management, and with high risk of complications and  mortality:  Advanced Dementia -Alzheimer, seizure disorder, HLD, HTN.  History of pulmonary embolism, currently on Eliquis.  Patient denies pain/discomfort, in no acute distress.  Spouse with no concerns today. History  obtained from review of EMR, discussion with primary team, caregiver, family and/or Mr. Ellen.  Review and summarization of Epic records shows history from other than patient. Rest of 10 point ROS asked and negative. Independent interpretation of tests and reviewed  as needed, available labs, patient records, imaging, studies and related documents from the EMR.  PHYSICAL EXAM: GEN: in no acute distress  Cardiac: S1 S2, no LE edema feet/ankles  Respiratory:  clear to auscultation bilaterally GI: soft, nontender,  + BS   MS: Moving all extremities, nonambulatory  Skin: warm and dry, no rash to visible skin Neuro: Generalized weakness, nonfocal, alert and oriented x 1, memory loss Psych: non-anxious affect     PAST MEDICAL HISTORY:  Active Ambulatory Problems    Diagnosis Date Noted   Pneumonia due to COVID-19 virus 04/25/2020   ARF (acute renal failure) (HCC) 04/25/2020   DNR (do not resuscitate) 04/25/2020   Essential hypertension 04/25/2020   Pulmonary emboli (HCC) 01/06/2021   Protein-calorie malnutrition, severe 01/08/2021   Seizure (HCC) 01/15/2021   Alzheimer disease (HCC)    Resolved Ambulatory Problems    Diagnosis Date Noted   No Resolved Ambulatory Problems   Past Medical History:  Diagnosis Date   COVID    Hypercholesteremia    Hypertension    Seizures (HCC)     SOCIAL HX:  Social History   Tobacco Use   Smoking status: Never   Smokeless tobacco: Never  Substance Use Topics   Alcohol use: Yes    Comment: 3 glasses of wine a week     FAMILY HX:  Family History  Problem Relation Age of Onset   Diabetes Mellitus II Neg Hx       ALLERGIES: No Known Allergies    PERTINENT MEDICATIONS:  Outpatient Encounter Medications as of 06/02/2022  Medication Sig   apixaban (ELIQUIS) 5 MG TABS tablet Take 1 tablet (5 mg total) by mouth 2 (two) times daily.   divalproex (DEPAKOTE) 250 MG DR tablet Take 2 tablets (500 mg total) by mouth 2 (two) times daily for 3  days. (Patient not taking: Reported on 04/08/2021)   donepezil (ARICEPT) 10 MG tablet Take 10 mg by mouth at bedtime.   lacosamide (VIMPAT) 50 MG TABS tablet Take 1 tablet (50 mg total) by mouth 2 (two) times daily.   LORazepam (ATIVAN) 1 MG tablet Take 0.5 tablets (0.5 mg total) by mouth 2 (two) times daily as needed for anxiety.   sertraline (ZOLOFT) 25 MG tablet Take 25 mg by mouth daily.   No facility-administered encounter medications on file as of 06/02/2022.     Thank you for the opportunity to participate in the care of Mr. Chopin.  The palliative care team will continue to follow. Please call our office at [phone] if we can be of additional assistance.   Note: Portions of this note were generated with Scientist, clinical (histocompatibility and immunogenetics). Dictation errors may occur despite best attempts at proofreading.  Rosaura Carpenter, NP

## 2022-06-20 ENCOUNTER — Non-Acute Institutional Stay: Payer: Medicare Other | Admitting: Hospice

## 2022-06-20 DIAGNOSIS — Z515 Encounter for palliative care: Secondary | ICD-10-CM

## 2022-06-20 DIAGNOSIS — F0390 Unspecified dementia without behavioral disturbance: Secondary | ICD-10-CM

## 2022-06-20 DIAGNOSIS — F419 Anxiety disorder, unspecified: Secondary | ICD-10-CM

## 2022-06-20 DIAGNOSIS — R569 Unspecified convulsions: Secondary | ICD-10-CM

## 2022-06-20 NOTE — Progress Notes (Signed)
Therapist, nutritional Palliative Care Consult Note Telephone: [phone]  Fax: [phone]  PATIENT NAME: Jonathan Berry [address] [place] [address] [phone] (home)  DOB: 02/09/1945 MRN: [id number]  PRIMARY CARE PROVIDER:    Ara Kussmaul NP  REFERRING PROVIDER:   Ara Kussmaul NP  RESPONSIBLE PARTY:    Contact Information     Name Relation Home Work [address]   Nathaneil, [name] Spouse [phone]  [phone]      I met face to face with patient in the facility. Visit to build trust and highlight Palliative Medicine as specialized medical care for people living with serious illness, aimed at facilitating better quality of life through symptoms relief, assisting with advance care planning and complex medical decision making.  NP called updated [name] on the visit. She expressed appreciation at the update.  Visit consisted of counseling and education dealing with the complex and emotionally intense issues of symptom management and palliative care in the setting of serious and potentially life-threatening illness. Palliative care team will continue to support patient, patient's family, and medical team.   Palliative care team will continue to support patient, patient's family, and medical team.  ASSESSMENT AND / RECOMMENDATIONS:    CODE STATUS: Do Not Resuscitate  Goals of Care: Goals include to maximize quality of life and symptom management.  Family is interested in hospice service in the future when patient qualifies for it.  Symptom Management/Plan: Althziemer's Dementia: Patient requiring more cuing to feed self, difficulty finding words, limited language, memory loss/confusion, impoverished thought. Incontinent of bowel and bladder, nonambulatory, fast 7A.  Continue Aricept as ordered.  Provide assistance with ADLs, provide cueing and redirection as needed.  Fall/safety precautions.  Seizure: Mini seizure 05/28/22.  Monitor  closely, seizure precautions.  Continue lacosamide as ordered. Routine CBC CMP  Depression: Managed with Zoloft.  Encourage socialization and participation in facility activities.  Psych consult as needed.  Follow up: Palliative care will continue to follow for complex medical decision making, advance care planning, and clarification of goals. Return 6 weeks or prn. Encouraged to call provider sooner with any concerns.   Family /Caregiver/Community Supports: Spouse lives close by and is involved in his care.  Strong family support system maintained.  HOSPICE ELIGIBILITY/DIAGNOSIS: TBD  Chief Complaint: f/u visit  HISTORY OF PRESENT ILLNESS:  Jonathan Berry is a 77 y.o. year old male  with multiple morbidities requiring close monitoring/management, and with high risk of complications and  mortality:  Advanced Dementia -Alzheimer, seizure disorder, HLD, HTN.  History of pulmonary embolism, currently on Eliquis.  Patient denies pain/discomfort, in no acute distress. FLACC 0. History obtained from review of EMR, discussion with primary team, caregiver, family and/or [name].  Review and summarization of Epic records shows history from other than patient. Rest of 10 point ROS asked and negative. Independent interpretation of tests and reviewed as needed, available labs, patient records, imaging, studies and related documents from the EMR.  PHYSICAL EXAM: GEN: in no acute distress  Cardiac: S1 S2, no LE edema feet/ankles  Respiratory:  clear to auscultation bilaterally GI: soft, nontender,  + BS   MS: Moving all extremities, ambulatory, tremors in bil hands, spouse says it is chronic Neuro: Generalized weakness, nonfocal, alert and oriented x 1, memory loss Psych: non-anxious affect     PAST MEDICAL HISTORY:  Active Ambulatory Problems    Diagnosis Date Noted   Pneumonia due to COVID-19 virus 04/25/2020   ARF (acute renal failure) (HCC) 04/25/2020  DNR (do not resuscitate) 04/25/2020    Essential hypertension 04/25/2020   Pulmonary emboli (HCC) 01/06/2021   Protein-calorie malnutrition, severe 01/08/2021   Seizure (HCC) 01/15/2021   Alzheimer disease (HCC)    Resolved Ambulatory Problems    Diagnosis Date Noted   No Resolved Ambulatory Problems   Past Medical History:  Diagnosis Date   COVID    Hypercholesteremia    Hypertension    Seizures (HCC)     SOCIAL HX:  Social History   Tobacco Use   Smoking status: Never   Smokeless tobacco: Never  Substance Use Topics   Alcohol use: Yes    Comment: 3 glasses of wine a week     FAMILY HX:  Family History  Problem Relation Age of Onset   Diabetes Mellitus II Neg Hx       ALLERGIES: No Known Allergies    PERTINENT MEDICATIONS:  Outpatient Encounter Medications as of 06/20/2022  Medication Sig   apixaban (ELIQUIS) 5 MG TABS tablet Take 1 tablet (5 mg total) by mouth 2 (two) times daily.   divalproex (DEPAKOTE) 250 MG DR tablet Take 2 tablets (500 mg total) by mouth 2 (two) times daily for 3 days. (Patient not taking: Reported on 04/08/2021)   donepezil (ARICEPT) 10 MG tablet Take 10 mg by mouth at bedtime.   lacosamide (VIMPAT) 50 MG TABS tablet Take 1 tablet (50 mg total) by mouth 2 (two) times daily.   LORazepam (ATIVAN) 1 MG tablet Take 0.5 tablets (0.5 mg total) by mouth 2 (two) times daily as needed for anxiety.   sertraline (ZOLOFT) 25 MG tablet Take 25 mg by mouth daily.   No facility-administered encounter medications on file as of 06/20/2022.    I spent 40 minutes providing this consultation; time includes spent with patient/family, chart review and documentation. More than 50% of the time in this consultation was spent on care coordination. Thank you for the opportunity to participate in the care of [name].  The palliative care team will continue to follow. Please call our office at [phone] if we can be of additional assistance.   Note: Portions of this note were generated with Administrator, sports. Dictation errors may occur despite best attempts at proofreading.  Rosaura Carpenter, NP

## 2022-10-06 ENCOUNTER — Emergency Department (HOSPITAL_COMMUNITY)
Admission: EM | Admit: 2022-10-06 | Discharge: 2022-10-06 | Disposition: A | Discharge status: Skilled Nursing Facility | Payer: Medicare Other | Attending: Emergency Medicine | Admitting: Emergency Medicine

## 2022-10-06 ENCOUNTER — Encounter (HOSPITAL_COMMUNITY): Payer: Self-pay | Admitting: Emergency Medicine

## 2022-10-06 ENCOUNTER — Emergency Department (HOSPITAL_COMMUNITY): Payer: Medicare Other

## 2022-10-06 DIAGNOSIS — R569 Unspecified convulsions: Secondary | ICD-10-CM | POA: Diagnosis present

## 2022-10-06 DIAGNOSIS — Z7901 Long term (current) use of anticoagulants: Secondary | ICD-10-CM | POA: Diagnosis not present

## 2022-10-06 LAB — CBC WITH DIFFERENTIAL/PLATELET
Abs Immature Granulocytes: 0.04 10*3/uL (ref 0.00–0.07)
Basophils Absolute: 0.1 10*3/uL (ref 0.0–0.1)
Basophils Relative: 1 %
Eosinophils Absolute: 0.3 10*3/uL (ref 0.0–0.5)
Eosinophils Relative: 4 %
HCT: 41.4 % (ref 39.0–52.0)
Hemoglobin: 13.4 g/dL (ref 13.0–17.0)
Immature Granulocytes: 1 %
Lymphocytes Relative: 21 %
Lymphs Abs: 1.8 10*3/uL (ref 0.7–4.0)
MCH: 28.8 pg (ref 26.0–34.0)
MCHC: 32.4 g/dL (ref 30.0–36.0)
MCV: 89 fL (ref 80.0–100.0)
Monocytes Absolute: 0.6 10*3/uL (ref 0.1–1.0)
Monocytes Relative: 7 %
Neutro Abs: 5.7 10*3/uL (ref 1.7–7.7)
Neutrophils Relative %: 66 %
Platelets: 274 10*3/uL (ref 150–400)
RBC: 4.65 MIL/uL (ref 4.22–5.81)
RDW: 13.1 % (ref 11.5–15.5)
WBC: 8.5 10*3/uL (ref 4.0–10.5)
nRBC: 0 % (ref 0.0–0.2)

## 2022-10-06 LAB — COMPREHENSIVE METABOLIC PANEL
ALT: 13 U/L (ref 0–44)
AST: 20 U/L (ref 15–41)
Albumin: 3.2 g/dL — ABNORMAL LOW (ref 3.5–5.0)
Alkaline Phosphatase: 84 U/L (ref 38–126)
Anion gap: 12 (ref 5–15)
BUN: 13 mg/dL (ref 8–23)
CO2: 23 mmol/L (ref 22–32)
Calcium: 8.6 mg/dL — ABNORMAL LOW (ref 8.9–10.3)
Chloride: 104 mmol/L (ref 98–111)
Creatinine, Ser: 0.99 mg/dL (ref 0.61–1.24)
GFR, Estimated: >60 mL/min (ref >60)
Glucose, Bld: 106 mg/dL — ABNORMAL HIGH (ref 70–99)
Potassium: 3.5 mmol/L (ref 3.5–5.1)
Sodium: 139 mmol/L (ref 135–145)
Total Bilirubin: 0.6 mg/dL (ref 0.3–1.2)
Total Protein: 5.6 g/dL — ABNORMAL LOW (ref 6.5–8.1)

## 2022-10-06 LAB — MAGNESIUM: Magnesium: 2 mg/dL (ref 1.7–2.4)

## 2022-10-06 LAB — CBG MONITORING, ED: Glucose-Capillary: 107 mg/dL — ABNORMAL HIGH (ref 70–99)

## 2022-10-06 NOTE — ED Triage Notes (Signed)
Pt from [facility] memory [facility]. Staff helping him after being in bathroom. Witnessed full body convulsions, lips turned blue, drooling, eyes rolled back in head. Lasted 2 mins. Staff say he seems mostly at baseline now possibly a little more lethargic than normal.  CBG WNL per EMS.

## 2022-10-06 NOTE — ED Notes (Signed)
Patient transported to CT 

## 2022-10-06 NOTE — ED Notes (Signed)
Unable to reach memory care unit at [facility], front desk mentioned that memory unit has not been answering phones all night. Pt did not come with facility paperwork.

## 2022-10-06 NOTE — ED Provider Notes (Signed)
Jonathan Berry EMERGENCY DEPARTMENT AT [facility] Provider Note   CSN: [id number] Arrival date & time: 10/06/22  0631     History  Chief Complaint  Patient presents with   Seizures    Jonathan Berry is a 77 y.o. male.  HPI Patient presents from nursing facility after seizure-like episode.  History is obtained from nursing staff, EMS as the patient is essentially nonverbal, which is apparently baseline. Per nursing the patient had episode of seizure-like activity with minimal interactivity subsequently.  EMS reports the patient was nonverbal in transport, hemodynamically unremarkable.  No reported fall, trauma, episode was witnessed.  Level 5 caveat secondary to acuity of condition.    Home Medications Prior to Admission medications   Medication Sig Start Date End Date Taking? Authorizing Provider  acetaminophen (TYLENOL) 500 MG tablet Take 500 mg by mouth every 6 (six) hours as needed for mild pain, moderate pain or fever.   Yes [provider]  apixaban (ELIQUIS) 5 MG TABS tablet Take 1 tablet (5 mg total) by mouth 2 (two) times daily. 01/17/21  Yes Lewie Chamber, MD  donepezil (ARICEPT) 10 MG tablet Take 10 mg by mouth at bedtime.   Yes [provider]  lacosamide (VIMPAT) 50 MG TABS tablet Take 1 tablet (50 mg total) by mouth 2 (two) times daily. 04/08/21  Yes Benjiman Core, MD  LORazepam (ATIVAN) 1 MG tablet Take 0.5 tablets (0.5 mg total) by mouth 2 (two) times daily as needed for anxiety. 01/10/21  Yes Marinda Elk, MD  Polyethyl Glycol-Propyl Glycol (SYSTANE) 0.4-0.3 % SOLN Place 2 drops into both eyes 4 (four) times daily as needed (dry eyes/redness/irritation/discomfort).   Yes [provider]  sertraline (ZOLOFT) 50 MG tablet Take 50 mg by mouth daily. 06/04/22  Yes [provider]  ZINC OXIDE, TOPICAL, (SECURA PROTECTIVE) 10 % CREA Apply 1 Application topically daily as needed (excessive soiling).   Yes [provider]  divalproex (DEPAKOTE) 250 MG DR tablet Take 2 tablets (500 mg total) by mouth 2 (two) times daily for 3 days. Patient not taking: Reported on 04/08/2021 01/17/21 01/20/21  Lewie Chamber, MD      Allergies    Patient has no known allergies.    Review of Systems   Review of Systems  Unable to perform ROS: Dementia    Physical Exam Updated Vital Signs BP (!) 145/84   Pulse 77   Temp 98.3 F (36.8 C) (Axillary)   Resp 19   Ht 5\' 8"  (1.727 m)   Wt 60.4 kg   SpO2 94%   BMI 20.25 kg/m  Physical Exam Vitals and nursing note reviewed.  Constitutional:      General: He is not in acute distress.    Appearance: He is well-developed.     Comments: Withdrawn elderly male eyes open, minimally interactive  HENT:     Head: Normocephalic and atraumatic.  Eyes:     Conjunctiva/sclera: Conjunctivae normal.  Cardiovascular:     Rate and Rhythm: Normal rate and regular rhythm.  Pulmonary:     Effort: Pulmonary effort is normal. No respiratory distress.     Breath sounds: No stridor.  Abdominal:     General: There is no distension.  Skin:    General: Skin is warm and dry.  Neurological:     Comments: Moves his extremities spontaneously, does not follow commands, is nonverbal  Psychiatric:        Cognition and Memory: Cognition is impaired. Memory is impaired.  ED Results / Procedures / Treatments   Labs (all labs ordered are listed, but only abnormal results are displayed) Labs Reviewed  COMPREHENSIVE METABOLIC PANEL - Abnormal; Notable for the following components:      Result Value   Glucose, Bld 106 (*)    Calcium 8.6 (*)    Total Protein 5.6 (*)    Albumin 3.2 (*)    All other components within normal limits  CBG MONITORING, ED - Abnormal; Notable for the following components:   Glucose-Capillary 107 (*)    All other components within normal limits  CBC WITH DIFFERENTIAL/PLATELET  MAGNESIUM    EKG EKG Interpretation Date/Time:  Monday October 06 2022 06:38:21  EDT Ventricular Rate:  67 PR Interval:  169 QRS Duration:  108 QT Interval:  414 QTC Calculation: 437 R Axis:   -13  Text Interpretation: Sinus rhythm Artifact Abnormal ECG Confirmed by Gerhard Munch [id number] on 10/06/2022 10:05:46 AM  Radiology CT Head Wo Contrast  Result Date: 10/06/2022 CLINICAL DATA:  Mental status change with unknown cause. EXAM: CT HEAD WITHOUT CONTRAST TECHNIQUE: Contiguous axial images were obtained from the base of the skull through the vertex without intravenous contrast. RADIATION DOSE REDUCTION: This exam was performed according to the departmental dose-optimization program which includes automated exposure control, adjustment of the mA and/or kV according to patient size and/or use of iterative reconstruction technique. COMPARISON:  09/15/2021 FINDINGS: Brain: No evidence of acute infarction, hemorrhage, hydrocephalus, extra-axial collection or mass lesion/mass effect. Generalized brain atrophy, advanced. Vascular: No hyperdense vessel or unexpected calcification. Skull: Normal. Negative for fracture or focal lesion. Sinuses/Orbits: No acute finding. IMPRESSION: 1. No acute or reversible finding. 2. Advanced brain atrophy. Electronically Signed   By: Tiburcio Pea M.D.   On: 10/06/2022 09:54    Procedures Procedures    Medications Ordered in ED Medications - No data to display  ED Course/ Medical Decision Making/ A&P                                 Medical Decision Making Elderly male, anticoagulated with a history of PE presents after an episode of seizure like activity. Patient is nonverbal here, hemodynamically unremarkable.  Dementia is prohibitive for obtaining history, but the patient was monitored, had labs, head CT with consideration electrolyte abnormalities, intracranial bleed or other abnormality. Cardiac 75 sinus normal Pulse ox 100% room air normal Patient monitored for hours without decompensation.  Head CT reviewed, labs reviewed, no  intracranial abnormality, labs within normal limits, no additional seizure activity.  I discussed his case with his wife via telephone, patient will return to his nursing facility.   Amount and/or Complexity of Data Reviewed Independent Historian: EMS External Data Reviewed: notes. Labs: ordered. Decision-making details documented in ED Course. Radiology: ordered and independent interpretation performed. Decision-making details documented in ED Course. ECG/medicine tests: ordered and independent interpretation performed. Decision-making details documented in ED Course.  Risk Prescription drug management. Decision regarding hospitalization. Diagnosis or treatment significantly limited by social determinants of health.  Update: Patient in similar condition as on arrival.   Final Clinical Impression(s) / ED Diagnoses Final diagnoses:  Seizure [facility])    Rx / DC Orders ED Discharge Orders     None         Gerhard Munch, MD 10/06/22 1007

## 2022-10-06 NOTE — Discharge Instructions (Signed)
Please be sure to follow-up with your physician.  In particular discussed today's seizure episode and your medications to ensure that you are on an appropriate regimen. Return here for concerning changes in your condition.

## 2022-10-06 NOTE — ED Notes (Signed)
[facility] called for discharge.

## 2023-03-18 ENCOUNTER — Other Ambulatory Visit: Payer: Self-pay

## 2023-03-18 ENCOUNTER — Emergency Department (HOSPITAL_COMMUNITY)

## 2023-03-18 ENCOUNTER — Inpatient Hospital Stay (HOSPITAL_COMMUNITY)
Admission: EM | Admit: 2023-03-18 | Discharge: 2023-03-24 | DRG: 871 | Disposition: A | Discharge status: Skilled Nursing Facility | Source: Skilled Nursing Facility | Attending: Internal Medicine | Admitting: Internal Medicine

## 2023-03-18 ENCOUNTER — Inpatient Hospital Stay (HOSPITAL_COMMUNITY)

## 2023-03-18 DIAGNOSIS — E870 Hyperosmolality and hypernatremia: Secondary | ICD-10-CM | POA: Diagnosis present

## 2023-03-18 DIAGNOSIS — G40909 Epilepsy, unspecified, not intractable, without status epilepticus: Secondary | ICD-10-CM | POA: Diagnosis present

## 2023-03-18 DIAGNOSIS — A419 Sepsis, unspecified organism: Principal | ICD-10-CM | POA: Diagnosis present

## 2023-03-18 DIAGNOSIS — R1310 Dysphagia, unspecified: Secondary | ICD-10-CM | POA: Diagnosis present

## 2023-03-18 DIAGNOSIS — E860 Dehydration: Secondary | ICD-10-CM | POA: Diagnosis present

## 2023-03-18 DIAGNOSIS — K529 Noninfective gastroenteritis and colitis, unspecified: Secondary | ICD-10-CM | POA: Diagnosis present

## 2023-03-18 DIAGNOSIS — G309 Alzheimer's disease, unspecified: Secondary | ICD-10-CM | POA: Diagnosis present

## 2023-03-18 DIAGNOSIS — Z66 Do not resuscitate: Secondary | ICD-10-CM | POA: Diagnosis present

## 2023-03-18 DIAGNOSIS — A0811 Acute gastroenteropathy due to Norwalk agent: Secondary | ICD-10-CM | POA: Diagnosis present

## 2023-03-18 DIAGNOSIS — R509 Fever, unspecified: Secondary | ICD-10-CM | POA: Diagnosis not present

## 2023-03-18 DIAGNOSIS — R569 Unspecified convulsions: Secondary | ICD-10-CM

## 2023-03-18 DIAGNOSIS — J690 Pneumonitis due to inhalation of food and vomit: Secondary | ICD-10-CM | POA: Diagnosis not present

## 2023-03-18 DIAGNOSIS — Z86711 Personal history of pulmonary embolism: Secondary | ICD-10-CM

## 2023-03-18 DIAGNOSIS — J9811 Atelectasis: Secondary | ICD-10-CM | POA: Diagnosis present

## 2023-03-18 DIAGNOSIS — F0280 Dementia in other diseases classified elsewhere without behavioral disturbance: Secondary | ICD-10-CM | POA: Diagnosis present

## 2023-03-18 DIAGNOSIS — E876 Hypokalemia: Secondary | ICD-10-CM | POA: Diagnosis not present

## 2023-03-18 DIAGNOSIS — Z1152 Encounter for screening for COVID-19: Secondary | ICD-10-CM | POA: Diagnosis not present

## 2023-03-18 DIAGNOSIS — Z79899 Other long term (current) drug therapy: Secondary | ICD-10-CM

## 2023-03-18 DIAGNOSIS — Z7901 Long term (current) use of anticoagulants: Secondary | ICD-10-CM | POA: Diagnosis not present

## 2023-03-18 DIAGNOSIS — E7800 Pure hypercholesterolemia, unspecified: Secondary | ICD-10-CM | POA: Diagnosis present

## 2023-03-18 DIAGNOSIS — I10 Essential (primary) hypertension: Secondary | ICD-10-CM | POA: Diagnosis present

## 2023-03-18 DIAGNOSIS — A045 Campylobacter enteritis: Secondary | ICD-10-CM | POA: Diagnosis present

## 2023-03-18 DIAGNOSIS — I2699 Other pulmonary embolism without acute cor pulmonale: Secondary | ICD-10-CM | POA: Diagnosis present

## 2023-03-18 LAB — RESP PANEL BY RT-PCR (RSV, FLU A&B, COVID)  RVPGX2
Influenza A by PCR: NEGATIVE
Influenza B by PCR: NEGATIVE
Resp Syncytial Virus by PCR: NEGATIVE
SARS Coronavirus 2 by RT PCR: NEGATIVE

## 2023-03-18 LAB — RESPIRATORY PANEL BY PCR

## 2023-03-18 LAB — COMPREHENSIVE METABOLIC PANEL
ALT: 36 U/L (ref 0–44)
AST: 87 U/L — ABNORMAL HIGH (ref 15–41)
Albumin: 3.6 g/dL (ref 3.5–5.0)
Alkaline Phosphatase: 60 U/L (ref 38–126)
Anion gap: 18 — ABNORMAL HIGH (ref 5–15)
BUN: 28 mg/dL — ABNORMAL HIGH (ref 8–23)
CO2: 24 mmol/L (ref 22–32)
Calcium: 9.5 mg/dL (ref 8.9–10.3)
Chloride: 108 mmol/L (ref 98–111)
Creatinine, Ser: 1.08 mg/dL (ref 0.61–1.24)
GFR, Estimated: >60 mL/min (ref >60)
Glucose, Bld: 108 mg/dL — ABNORMAL HIGH (ref 70–99)
Potassium: 4.2 mmol/L (ref 3.5–5.1)
Sodium: 150 mmol/L — ABNORMAL HIGH (ref 135–145)
Total Bilirubin: 0.8 mg/dL (ref 0.0–1.2)
Total Protein: 7.1 g/dL (ref 6.5–8.1)

## 2023-03-18 LAB — CBC WITH DIFFERENTIAL/PLATELET
Abs Immature Granulocytes: 0.03 10*3/uL (ref 0.00–0.07)
Basophils Absolute: 0 10*3/uL (ref 0.0–0.1)
Basophils Relative: 0 %
Eosinophils Absolute: 0 10*3/uL (ref 0.0–0.5)
Eosinophils Relative: 0 %
HCT: 47.9 % (ref 39.0–52.0)
Hemoglobin: 15.6 g/dL (ref 13.0–17.0)
Immature Granulocytes: 0 %
Lymphocytes Relative: 16 %
Lymphs Abs: 1.6 10*3/uL (ref 0.7–4.0)
MCH: 28.9 pg (ref 26.0–34.0)
MCHC: 32.6 g/dL (ref 30.0–36.0)
MCV: 88.7 fL (ref 80.0–100.0)
Monocytes Absolute: 0.9 10*3/uL (ref 0.1–1.0)
Monocytes Relative: 9 %
Neutro Abs: 7.6 10*3/uL (ref 1.7–7.7)
Neutrophils Relative %: 75 %
Platelets: 269 10*3/uL (ref 150–400)
RBC: 5.4 MIL/uL (ref 4.22–5.81)
RDW: 13.2 % (ref 11.5–15.5)
WBC: 10.1 10*3/uL (ref 4.0–10.5)
nRBC: 0 % (ref 0.0–0.2)

## 2023-03-18 LAB — I-STAT CG4 LACTIC ACID, ED: Lactic Acid, Venous: 1.6 mmol/L (ref 0.5–1.9)

## 2023-03-18 LAB — PROTIME-INR
INR: 1.5 — ABNORMAL HIGH (ref 0.8–1.2)
Prothrombin Time: 18.2 s — ABNORMAL HIGH (ref 11.4–15.2)

## 2023-03-18 LAB — APTT: aPTT: 33 s (ref 24–36)

## 2023-03-18 LAB — CBG MONITORING, ED: Glucose-Capillary: 145 mg/dL — ABNORMAL HIGH (ref 70–99)

## 2023-03-18 LAB — CK: Total CK: 1433 U/L — ABNORMAL HIGH (ref 49–397)

## 2023-03-18 MED ORDER — SODIUM CHLORIDE 0.9 % IV SOLN
2.0000 g | Freq: Three times a day (TID) | INTRAVENOUS | Status: DC
  Administered 2023-03-19: 2 g via INTRAVENOUS
  Filled 2023-03-18: qty 12.5

## 2023-03-18 MED ORDER — METRONIDAZOLE 500 MG/100ML IV SOLN
500.0000 mg | Freq: Two times a day (BID) | INTRAVENOUS | Status: DC
  Administered 2023-03-19 – 2023-03-20 (×3): 500 mg via INTRAVENOUS
  Filled 2023-03-18 (×3): qty 100

## 2023-03-18 MED ORDER — LACTATED RINGERS IV SOLN
INTRAVENOUS | Status: DC
Start: 2023-03-18 — End: 2023-03-18

## 2023-03-18 MED ORDER — HEPARIN (PORCINE) 25000 UT/250ML-% IV SOLN
950.0000 [IU]/h | INTRAVENOUS | Status: DC
  Administered 2023-03-18: 900 [IU]/h via INTRAVENOUS
  Administered 2023-03-19: 950 [IU]/h via INTRAVENOUS
  Filled 2023-03-18 (×2): qty 250

## 2023-03-18 MED ORDER — PANTOPRAZOLE SODIUM 40 MG IV SOLR
40.0000 mg | Freq: Two times a day (BID) | INTRAVENOUS | Status: DC
  Administered 2023-03-18 – 2023-03-24 (×12): 40 mg via INTRAVENOUS
  Filled 2023-03-18 (×12): qty 10

## 2023-03-18 MED ORDER — DEXTROSE 5 % IV SOLN: INTRAVENOUS | Status: AC

## 2023-03-18 MED ORDER — VANCOMYCIN HCL IN DEXTROSE 1-5 GM/200ML-% IV SOLN
1000.0000 mg | Freq: Once | INTRAVENOUS | Status: AC
  Administered 2023-03-18: 1000 mg via INTRAVENOUS
  Filled 2023-03-18: qty 200

## 2023-03-18 MED ORDER — SODIUM CHLORIDE 0.9 % IV BOLUS
500.0000 mL | Freq: Once | INTRAVENOUS | Status: AC
  Administered 2023-03-18: 500 mL via INTRAVENOUS

## 2023-03-18 MED ORDER — ACETAMINOPHEN 650 MG RE SUPP
650.0000 mg | Freq: Once | RECTAL | Status: AC
  Administered 2023-03-18: 650 mg via RECTAL
  Filled 2023-03-18: qty 1

## 2023-03-18 MED ORDER — THIAMINE HCL 100 MG/ML IJ SOLN
100.0000 mg | Freq: Every day | INTRAMUSCULAR | Status: DC
  Administered 2023-03-18 – 2023-03-24 (×7): 100 mg via INTRAVENOUS
  Filled 2023-03-18 (×7): qty 2

## 2023-03-18 MED ORDER — ACETAMINOPHEN 650 MG RE SUPP
650.0000 mg | Freq: Four times a day (QID) | RECTAL | Status: DC | PRN
  Administered 2023-03-20: 650 mg via RECTAL
  Filled 2023-03-18: qty 1

## 2023-03-18 MED ORDER — METRONIDAZOLE 500 MG/100ML IV SOLN
500.0000 mg | Freq: Once | INTRAVENOUS | Status: AC
  Administered 2023-03-18: 500 mg via INTRAVENOUS
  Filled 2023-03-18: qty 100

## 2023-03-18 MED ORDER — HYDRALAZINE HCL 20 MG/ML IJ SOLN
10.0000 mg | Freq: Four times a day (QID) | INTRAMUSCULAR | Status: DC | PRN
  Administered 2023-03-20: 10 mg via INTRAVENOUS
  Filled 2023-03-18: qty 1

## 2023-03-18 MED ORDER — SODIUM CHLORIDE 0.9 % IV SOLN
2.0000 g | Freq: Once | INTRAVENOUS | Status: AC
  Administered 2023-03-18: 2 g via INTRAVENOUS
  Filled 2023-03-18: qty 12.5

## 2023-03-18 MED ORDER — ACETAMINOPHEN 325 MG PO TABS: 650.0000 mg | ORAL_TABLET | Freq: Four times a day (QID) | ORAL | Status: DC | PRN

## 2023-03-18 NOTE — Progress Notes (Addendum)
 PHARMACY - ANTICOAGULATION CONSULT NOTE  Pharmacy Consult for [facility] IV Infusion Indication: pulmonary embolus (2022)  No Known Allergies  Patient Measurements:   Heparin Dosing Weight: 64.5 kg  Vital Signs: Temp: 101.1 F (38.4 C) (03/05 1506) Temp Source: Rectal (03/05 1506) BP: 165/89 (03/05 1630) Pulse Rate: 85 (03/05 1630)  Labs: Recent Labs    03/18/23 1508  HGB 15.6  HCT 47.9  PLT 269  APTT 33  LABPROT 18.2*  INR 1.5*  CREATININE 1.08     Medical History: Past Medical History:  Diagnosis Date   Alzheimer disease (HCC)    COVID    Hypercholesteremia    Hypertension    Seizures (HCC)     Medications:  (Not in a hospital admission)  Scheduled:   pantoprazole (PROTONIX) IV  40 mg Intravenous Q12H   thiamine (VITAMIN B1) injection  100 mg Intravenous Daily   Infusions:   [START ON 03/19/2023] ceFEPime (MAXIPIME) IV     dextrose 50 mL/hr at 03/18/23 1851   metronidazole 500 mg (03/18/23 1856)   [START ON 03/19/2023] metronidazole     vancomycin 1,000 mg (03/18/23 1900)   PRN: acetaminophen **OR** acetaminophen, hydrALAZINE Anti-infectives (From admission, onward)    Start     Dose/Rate Route Frequency Ordered Stop   03/19/23 0600  metroNIDAZOLE (FLAGYL) IVPB 500 mg        500 mg 100 mL/hr over 60 Minutes Intravenous Every 12 hours 03/18/23 1816     03/19/23 0100  ceFEPIme (MAXIPIME) 2 g in sodium chloride 0.9 % 100 mL IVPB        2 g 200 mL/hr over 30 Minutes Intravenous Every 8 hours 03/18/23 1933     03/18/23 1630  ceFEPIme (MAXIPIME) 2 g in sodium chloride 0.9 % 100 mL IVPB        2 g 200 mL/hr over 30 Minutes Intravenous  Once 03/18/23 1626 03/18/23 1706   03/18/23 1630  metroNIDAZOLE (FLAGYL) IVPB 500 mg        500 mg 100 mL/hr over 60 Minutes Intravenous  Once 03/18/23 1626     03/18/23 1630  vancomycin (VANCOCIN) IVPB 1000 mg/200 mL premix        1,000 mg 200 mL/hr over 60 Minutes Intravenous  Once 03/18/23 1626         Assessment: 77  YOM past medical history  of seizures, dementia, pulmonary embolism and PE diagnosed in December 2022 (Eliquis PTA) brought by EMS from nursing facility fever .  Patient had decreased PO intake.  He is nonverbal at baseline. CBC wnl. Last dose of Eliquis 03/18/23 at 0800.   Goal of Therapy:  aPTT 66-102 seconds Monitor platelets by anticoagulation protocol: Yes   Plan:  Start heparin infusion at 900 units/hr Check aPTT level in 6 hours and daily while on heparin Continue to monitor H&H and platelets  Wilmer Floor 03/18/2023,7:37 PM

## 2023-03-18 NOTE — H&P (Signed)
 History and Physical    Patient: Jonathan Berry WJX:[id number] DOB: 23-May-1945 DOA: 03/18/2023 DOS: the patient was seen and examined on 03/18/2023 PCP: Collene Mares, PA  Patient coming from:  [facility] in memory care unit   Chief complaint: Chief Complaint  Patient presents with   Fever   HPI:  Jonathan Berry is a 78 y.o. male with past medical history  of seizures, dementia, pulmonary embolism and PE diagnosed in December 2022 brought by EMS from nursing facility fever .  Patient had decreased p.o. intake.  He is nonverbal at baseline. Admission requested for patient is altered and question sepsis. Per wife pt is nonverbal at baseline occasional words.  Pt is able walks with assistance.  The SNF told her that he had norovirus as it is going around and pt had diarrhea and fever. At baseline he eats very well and normal. Pt knows wife and understands what she is saying but intermittently he does not understand. Pt has seizure history and started after going to [facility].  Pt had two PE at [facility] as well.   >>ED Course: In emergency room meet SIRS criteria, HPI is limited and is per chart review. Vitals:   03/18/23 1508 03/18/23 1510 03/18/23 1535 03/18/23 1630  BP:  (!) 144/102 (!) 118/97 (!) 165/89  Pulse: (!) 54 84 86 85  Temp:      Resp:  (!) 22 17 19   SpO2: 94% 92% 94% 92%  TempSrc:      Chest x-ray done today was negative for any acute intracranial processes or pneumonia. ED evaluation  so far shows: Patient's respiratory panel is negative for flu RSV and COVID.  Patient was started on IV fluids, urinalysis was pending. Metabolic panel shows sodium 150 chloride 108 glucose 108 creatinine 1.08 anion gap of 18 lactic acid normal at 1.6. CBC shows normal white count hemoglobin of 15.6 and normal platelets of 269.   In the emergency room  pt has received the following treatment thus far: Medications  lactated ringers infusion (has no administration in time range)   ceFEPIme (MAXIPIME) 2 g in sodium chloride 0.9 % 100 mL IVPB (2 g Intravenous New Bag/Given 03/18/23 1636)  metroNIDAZOLE (FLAGYL) IVPB 500 mg (has no administration in time range)  vancomycin (VANCOCIN) IVPB 1000 mg/200 mL premix (has no administration in time range)  acetaminophen (TYLENOL) suppository 650 mg (650 mg Rectal Given 03/18/23 1603)  sodium chloride 0.9 % bolus 500 mL (500 mLs Intravenous New Bag/Given 03/18/23 1608)   Review of Systems  Unable to perform ROS: Patient nonverbal   Past Medical History:  Diagnosis Date   Alzheimer disease (HCC)    COVID    Hypercholesteremia    Hypertension    Seizures (HCC)    Past Surgical History:  Procedure Laterality Date   KNEE SURGERY     MANDIBLE SURGERY      reports that he has never smoked. He has never used smokeless tobacco. He reports current alcohol use. He reports that he does not use drugs.  No Known Allergies  Family History  Problem Relation Age of Onset   Diabetes Mellitus II Neg Hx     Prior to Admission medications   Medication Sig Start Date End Date Taking? Authorizing Provider  acetaminophen (TYLENOL) 500 MG tablet Take 500 mg by mouth every 6 (six) hours as needed for mild pain, moderate pain or fever.    [provider]  apixaban (ELIQUIS) 5 MG TABS tablet Take  1 tablet (5 mg total) by mouth 2 (two) times daily. 01/17/21   Lewie Chamber, MD  divalproex (DEPAKOTE) 250 MG DR tablet Take 2 tablets (500 mg total) by mouth 2 (two) times daily for 3 days. Patient not taking: Reported on 04/08/2021 01/17/21 01/20/21  Lewie Chamber, MD  donepezil (ARICEPT) 10 MG tablet Take 10 mg by mouth at bedtime.    [provider]  lacosamide (VIMPAT) 50 MG TABS tablet Take 1 tablet (50 mg total) by mouth 2 (two) times daily. 04/08/21   Benjiman Core, MD  LORazepam (ATIVAN) 1 MG tablet Take 0.5 tablets (0.5 mg total) by mouth 2 (two) times daily as needed for anxiety. 01/10/21   Marinda Elk, MD  Polyethyl  Glycol-Propyl Glycol (SYSTANE) 0.4-0.3 % SOLN Place 2 drops into both eyes 4 (four) times daily as needed (dry eyes/redness/irritation/discomfort).    [provider]  sertraline (ZOLOFT) 50 MG tablet Take 50 mg by mouth daily. 06/04/22   [provider]  ZINC OXIDE, TOPICAL, (SECURA PROTECTIVE) 10 % CREA Apply 1 Application topically daily as needed (excessive soiling).    [provider]   Vitals:   03/18/23 1508 03/18/23 1510 03/18/23 1535 03/18/23 1630  BP:  (!) 144/102 (!) 118/97 (!) 165/89  Pulse: (!) 54 84 86 85  Resp:  (!) 22 17 19   Temp:      TempSrc:      SpO2: 94% 92% 94% 92%   Physical Exam Vitals and nursing note reviewed.  Constitutional:      General: He is not in acute distress. HENT:     Head: Normocephalic and atraumatic.     Right Ear: Hearing normal.     Left Ear: Hearing normal.     Nose: Nose normal. No nasal deformity.     Mouth/Throat:     Lips: Pink.     Tongue: No lesions.     Pharynx: Oropharynx is clear.  Eyes:     General: Lids are normal.     Extraocular Movements: Extraocular movements intact.  Cardiovascular:     Rate and Rhythm: Normal rate and regular rhythm.     Heart sounds: Normal heart sounds.  Pulmonary:     Effort: Pulmonary effort is normal.     Breath sounds: Normal breath sounds.  Abdominal:     General: Bowel sounds are normal. There is no distension.     Palpations: Abdomen is soft. There is no mass.     Tenderness: There is no abdominal tenderness.  Musculoskeletal:     Right lower leg: No edema.     Left lower leg: No edema.  Skin:    General: Skin is warm.  Neurological:     General: No focal deficit present.     Cranial Nerves: Cranial nerves 2-12 are intact.  Psychiatric:        Attention and Perception: Attention normal.     Comments: But nonverbal. Closes eyes to threat.  Does not follow commands. Hands are tremulous. DNR form at bedside.        Labs on Admission: I have personally  reviewed following labs and imaging studies Results for orders placed or performed during the hospital encounter of 03/18/23 (from the past 24 hours)  Comprehensive metabolic panel     Status: Abnormal   Collection Time: 03/18/23  3:08 PM  Result Value Ref Range   Sodium 150 (H) 135 - 145 mmol/L   Potassium 4.2 3.5 - 5.1 mmol/L   Chloride  108 98 - 111 mmol/L   CO2 24 22 - 32 mmol/L   Glucose, Bld 108 (H) 70 - 99 mg/dL   BUN 28 (H) 8 - 23 mg/dL   Creatinine, Ser 1.61 0.61 - 1.24 mg/dL   Calcium 9.5 8.9 - 09.6 mg/dL   Total Protein 7.1 6.5 - 8.1 g/dL   Albumin 3.6 3.5 - 5.0 g/dL   AST 87 (H) 15 - 41 U/L   ALT 36 0 - 44 U/L   Alkaline Phosphatase 60 38 - 126 U/L   Total Bilirubin 0.8 0.0 - 1.2 mg/dL   GFR, Estimated >04 >54 mL/min   Anion gap 18 (H) 5 - 15  CBC with Differential     Status: None   Collection Time: 03/18/23  3:08 PM  Result Value Ref Range   WBC 10.1 4.0 - 10.5 K/uL   RBC 5.40 4.22 - 5.81 MIL/uL   Hemoglobin 15.6 13.0 - 17.0 g/dL   HCT 09.8 11.9 - 14.7 %   MCV 88.7 80.0 - 100.0 fL   MCH 28.9 26.0 - 34.0 pg   MCHC 32.6 30.0 - 36.0 g/dL   RDW 82.9 56.2 - 13.0 %   Platelets 269 150 - 400 K/uL   nRBC 0.0 0.0 - 0.2 %   Neutrophils Relative % 75 %   Neutro Abs 7.6 1.7 - 7.7 K/uL   Lymphocytes Relative 16 %   Lymphs Abs 1.6 0.7 - 4.0 K/uL   Monocytes Relative 9 %   Monocytes Absolute 0.9 0.1 - 1.0 K/uL   Eosinophils Relative 0 %   Eosinophils Absolute 0.0 0.0 - 0.5 K/uL   Basophils Relative 0 %   Basophils Absolute 0.0 0.0 - 0.1 K/uL   Immature Granulocytes 0 %   Abs Immature Granulocytes 0.03 0.00 - 0.07 K/uL  Protime-INR     Status: Abnormal   Collection Time: 03/18/23  3:08 PM  Result Value Ref Range   Prothrombin Time 18.2 (H) 11.4 - 15.2 seconds   INR 1.5 (H) 0.8 - 1.2  APTT     Status: None   Collection Time: 03/18/23  3:08 PM  Result Value Ref Range   aPTT 33 24 - 36 seconds  Resp panel by RT-PCR (RSV, Flu A&B, Covid) Anterior Nasal Swab     Status:  None   Collection Time: 03/18/23  3:11 PM   Specimen: Anterior Nasal Swab  Result Value Ref Range   SARS Coronavirus 2 by RT PCR NEGATIVE NEGATIVE   Influenza A by PCR NEGATIVE NEGATIVE   Influenza B by PCR NEGATIVE NEGATIVE   Resp Syncytial Virus by PCR NEGATIVE NEGATIVE  I-Stat Lactic Acid, ED     Status: None   Collection Time: 03/18/23  3:15 PM  Result Value Ref Range   Lactic Acid, Venous 1.6 0.5 - 1.9 mmol/L   Recent Results (from the past 720 hours)  Resp panel by RT-PCR (RSV, Flu A&B, Covid) Anterior Nasal Swab     Status: None   Collection Time: 03/18/23  3:11 PM   Specimen: Anterior Nasal Swab  Result Value Ref Range Status   SARS Coronavirus 2 by RT PCR NEGATIVE NEGATIVE Final   Influenza A by PCR NEGATIVE NEGATIVE Final   Influenza B by PCR NEGATIVE NEGATIVE Final    Comment: (NOTE) The Xpert Xpress SARS-CoV-2/FLU/RSV plus assay is intended as an aid in the diagnosis of influenza from Nasopharyngeal swab specimens and should not be used as a sole basis for treatment. Nasal  washings and aspirates are unacceptable for Xpert Xpress SARS-CoV-2/FLU/RSV testing.  Fact Sheet for Patients: BloggerCourse.com  Fact Sheet for Healthcare Providers: SeriousBroker.it  This test is not yet approved or cleared by the [place] FDA and has been authorized for detection and/or diagnosis of SARS-CoV-2 by FDA under an Emergency Use Authorization (EUA). This EUA will remain in effect (meaning this test can be used) for the duration of the COVID-19 declaration under Section 564(b)(1) of the Act, 21 U.S.C. section 360bbb-3(b)(1), unless the authorization is terminated or revoked.     Resp Syncytial Virus by PCR NEGATIVE NEGATIVE Final    Comment: (NOTE) Fact Sheet for Patients: BloggerCourse.com  Fact Sheet for Healthcare Providers: SeriousBroker.it  This test is not yet  approved or cleared by the [place] FDA and has been authorized for detection and/or diagnosis of SARS-CoV-2 by FDA under an Emergency Use Authorization (EUA). This EUA will remain in effect (meaning this test can be used) for the duration of the COVID-19 declaration under Section 564(b)(1) of the Act, 21 U.S.C. section 360bbb-3(b)(1), unless the authorization is terminated or revoked.  Performed at [facility], [address], [place], [place] [address]    CBC:    Latest Ref Rng & Units 03/18/2023    3:08 PM 10/06/2022    6:40 AM 09/15/2021    5:52 PM  CBC  WBC 4.0 - 10.5 K/uL 10.1  8.5    Hemoglobin 13.0 - 17.0 g/dL 60.4  54.0  98.1   Hematocrit 39.0 - 52.0 % 47.9  41.4  40.0   Platelets 150 - 400 K/uL 269  274     Basic Metabolic Panel: Recent Labs  Lab 03/18/23 1508  NA 150*  K 4.2  CL 108  CO2 24  GLUCOSE 108*  BUN 28*  CREATININE 1.08  CALCIUM 9.5   Creatinine: Lab Results  Component Value Date   CREATININE 1.08 03/18/2023   CREATININE 0.99 10/06/2022   CREATININE 0.90 09/15/2021   Liver Function Tests:    Latest Ref Rng & Units 03/18/2023    3:08 PM 10/06/2022    6:40 AM 09/15/2021    5:41 PM  Hepatic Function  Total Protein 6.5 - 8.1 g/dL 7.1  5.6  5.8   Albumin 3.5 - 5.0 g/dL 3.6  3.2  3.5   AST 15 - 41 U/L 87  20  23   ALT 0 - 44 U/L 36  13  17   Alk Phosphatase 38 - 126 U/L 60  84  68   Total Bilirubin 0.0 - 1.2 mg/dL 0.8  0.6  0.4    Coagulation Profile: Recent Labs  Lab 03/18/23 1508  INR 1.5*   Cardiac Enzymes: No results for input(s): "CKTOTAL", "CKMB", "CKMBINDEX", "TROPONINI" in the last 168 hours. BNP (last 3 results) No results for input(s): "PROBNP" in the last 8760 hours. HbA1C: No results for input(s): "HGBA1C" in the last 72 hours. Lipid Profile: No results for input(s): "CHOL", "HDL", "LDLCALC", "TRIG", "CHOLHDL", "LDLDIRECT" in the last 72 hours.  Radiological Exams on Admission: DG Chest Port 1 View Result Date:  03/18/2023 CLINICAL DATA:  Sepsis. EXAM: PORTABLE CHEST 1 VIEW COMPARISON:  09/15/2021 FINDINGS: The heart size and mediastinal contours are within normal limits. Both lungs are clear. The visualized skeletal structures are unremarkable. IMPRESSION: No active disease. Electronically Signed   By: Danae Orleans M.D.   On: 03/18/2023 18:07    Data Reviewed: Relevant notes from primary care and specialist visits, past  discharge summaries as available in EHR, including Care Everywhere. Prior diagnostic testing as pertinent to current admission diagnoses, Updated medications and problem lists for reconciliation ED course, including vitals, labs, imaging, treatment and response to treatment,Triage notes, nursing and pharmacy notes and ED provider's notes Notable results as noted in HPI.Discussed case with EDMD/ ED APP/ or Specialty MD on call and as needed.  >>Assessment and Plan: * Fever 2/2 GI iIlness.  Collect stool cultures and studies.   Hypernatremia    Latest Ref Rng & Units 03/18/2023    3:08 PM 10/06/2022    6:40 AM 09/15/2021    5:52 PM  BMP  Glucose 70 - 99 mg/dL 147  829  562   BUN 8 - 23 mg/dL 28  13  16    Creatinine 0.61 - 1.24 mg/dL 1.30  8.65  7.84   Sodium 135 - 145 mmol/L 150  139  140   Potassium 3.5 - 5.1 mmol/L 4.2  3.5  3.7   Chloride 98 - 111 mmol/L 108  104  104   CO2 22 - 32 mmol/L 24  23    Calcium 8.9 - 10.3 mg/dL 9.5  8.6    2/2 to dehydration possibly from diarrhea.  D5W at 75 cc x 24 hours.      Sepsis (HCC) Vitals:   03/18/23 1508 03/18/23 1510 03/18/23 1535 03/18/23 1630  BP:  (!) 144/102 (!) 118/97 (!) 165/89  Pulse: (!) 54 84 86 85  Temp:      Resp:  (!) 22 17 19   SpO2: 94% 92% 94% 92%  TempSrc:       Pt was had blood cultures in ed and urine cultures.  We will cont broad spectrum iv abx with vancomycin / cefepime and flagyl.     Alzheimer disease (HCC) Hold aricept. We will start iv AED.  Seizure [facility]) Seizure precaution.  Will d/w pharm to  convert to IV meds.   Pulmonary emboli (HCC) Will start pt on heparin until he is alert and taking   Essential hypertension Vitals:   03/18/23 1510 03/18/23 1535 03/18/23 1630  BP: (!) 144/102 (!) 118/97 (!) 165/89  Prn hydralazine.  Cont MIVF.    DNR (do not resuscitate) Wife agrees with FULL DNR, with no intervention.  Conservative medical measures no feeding tube.  No CPR/ NO ventilation.     D/w wife about his fair prognosis and unclear source of infection. Although the diarrhea was not reported to me earlier. We will obtain stool studies.   DVT prophylaxis:  Heparin  Consults:  None  Advance Care Planning:    Code Status: Full Code   Family Communication:  Wife on phone.  Disposition Plan:  Home. Severity of Illness: The appropriate patient status for this patient is INPATIENT. Inpatient status is judged to be reasonable and necessary in order to provide the required intensity of service to ensure the patient's safety. The patient's presenting symptoms, physical exam findings, and initial radiographic and laboratory data in the context of their chronic comorbidities is felt to place them at high risk for further clinical deterioration. Furthermore, it is not anticipated that the patient will be medically stable for discharge from the hospital within 2 midnights of admission.   * I certify that at the point of admission it is my clinical judgment that the patient will require inpatient hospital care spanning beyond 2 midnights from the point of admission due to high intensity of service, high risk for further deterioration and  high frequency of surveillance required.*  Author: Gertha Calkin, MD 03/18/2023 7:24 PM  For on call review [email or web address].   Unresulted Labs (From admission, onward)     Start     Ordered   03/19/23 0500  Protime-INR  Tomorrow morning,   R        03/18/23 1815   03/19/23 0500  Cortisol-am, blood  Tomorrow morning,   R        03/18/23 1815    03/18/23 1911  Gastrointestinal Panel by PCR , Stool  (Gastrointestinal Panel by PCR, Stool                                                                                                                                                     **Does Not include CLOSTRIDIUM DIFFICILE testing. **If CDIFF testing is needed, place order from the "C Difficile Testing" order set.**)  Once,   R        03/18/23 1910   03/18/23 1910  Respiratory (~20 pathogens) panel by PCR  (Respiratory panel by PCR (~20 pathogens, ~24 hr TAT)  w precautions)  Once,   R        03/18/23 1910   03/18/23 1808  Urinalysis, Routine w reflex microscopic -Urine, Random  Once,   URGENT       Question:  Specimen Source  Answer:  Urine, Random   03/18/23 1807   03/18/23 1714  CK  Once,   URGENT        03/18/23 1713   03/18/23 1508  Blood Culture (routine x 2)  (Undifferentiated presentation (screening labs and basic nursing orders))  BLOOD CULTURE X 2,   STAT      03/18/23 1507   03/18/23 1508  Urinalysis, w/ Reflex to Culture (Infection Suspected) -Urine, Unspecified Source  (Undifferentiated presentation (screening labs and basic nursing orders))  ONCE - URGENT,   URGENT       Question:  Specimen Source  Answer:  Urine, Unspecified Source   03/18/23 1507            Orders Placed This Encounter  Procedures   Critical Care   Blood Culture (routine x 2)   Resp panel by RT-PCR (RSV, Flu A&B, Covid) Anterior Nasal Swab   Respiratory (~20 pathogens) panel by PCR   Gastrointestinal Panel by PCR , Stool   DG Chest Port 1 View   CT HEAD WO CONTRAST ( )   Comprehensive metabolic panel   CBC with Differential   Protime-INR   APTT   Urinalysis, w/ Reflex to Culture (Infection Suspected) -Urine, Unspecified Source   CK   Urinalysis, Routine w reflex microscopic -Urine, Random   Protime-INR   Cortisol-am, blood   Diet NPO time specified   Document height and weight   Assess and Document Glasgow Coma Scale   Document  vital  signs within 1-hour of fluid bolus completion.  Notify provider of abnormal vital signs despite fluid resuscitation.   Refer to Sidebar Report: Sepsis Bundle ED/IP   Notify provider for difficulties obtaining IV access   Initiate Carrier Fluid Protocol   DO NOT delay antibiotics if unable to obtain blood culture.   Vital signs   Notify physician (specify)   Mobility Protocol: No Restrictions RN to initiate protocols based on patient's level of care   Refer to Sidebar Report Refer to ICU, Med-Surg, Progressive, and Step-Down Mobility Protocol Sidebars   If lactate (lactic acid) >2, verify repeat lactic acid order has been placed to be drawn   Document vital signs within 1-hour of fluid bolus completion and notify provider of bolus completion   Vital signs   Vital signs   RN to call RRT (rapid response team)   Initiate Adult Central Line Maintenance and Catheter Protocol for patients with central line (CVC, PICC, Port, Hemodialysis, Trialysis)   Apply Sepsis Care Plan   Refer to Sidebar Report: Sepsis Bundle ED/IP   Assess and Document Glasgow Coma Scale   Initiate Oral Care Protocol   Initiate Carrier Fluid Protocol   RN may order General Admission PRN Orders utilizing "General Admission PRN medications" (through manage orders) for the following patient needs: allergy symptoms (Claritin), cold sores (Carmex), cough (Robitussin DM), eye irritation (Liquifilm Tears), hemorrhoids (Tucks), indigestion (Maalox), minor skin irritation (Hydrocortisone Cream), muscle pain Romeo Apple Gay), nose irritation (saline nasal spray) and sore throat (Chloraseptic spray).   SCDs   Cardiac Monitoring - Continuous Indefinite   Swallow screen   Full code   Consult to hospitalist   Code Sepsis activation.  This occurs automatically when order is signed and prioritizes pharmacy, lab, and radiology services for STAT collections and interventions.  If [facility] downtime, call [facility] [phone]) to activate Code Sepsis.    monitoring by pharmacy   CeFEPIme (MAXIPIME) per pharmacy consult            vancomycin per pharmacy consult   Enteric precautions (UV disinfection) C difficile, Norovirus   Pulse oximetry (single)   ED EKG   EKG 12-Lead   Insert peripheral IV X 1   Insert 2nd peripheral IV if not already present.   Admit to Inpatient (patient's expected length of stay will be greater than 2 midnights or inpatient only procedure)

## 2023-03-18 NOTE — Assessment & Plan Note (Signed)
 Vitals:   03/18/23 1510 03/18/23 1535 03/18/23 1630  BP: (!) 144/102 (!) 118/97 (!) 165/89  Prn hydralazine.  Cont MIVF.

## 2023-03-18 NOTE — Assessment & Plan Note (Signed)
 Vitals:   03/18/23 1508 03/18/23 1510 03/18/23 1535 03/18/23 1630  BP:  (!) 144/102 (!) 118/97 (!) 165/89  Pulse: (!) 54 84 86 85  Temp:      Resp:  (!) 22 17 19   SpO2: 94% 92% 94% 92%  TempSrc:       Pt was had blood cultures in ed and urine cultures.  We will cont broad spectrum iv abx with vancomycin / cefepime and flagyl.

## 2023-03-18 NOTE — Assessment & Plan Note (Signed)
 Hold aricept. We will start iv AED.

## 2023-03-18 NOTE — ED Triage Notes (Signed)
 Pt arrives via EMS from [facility]. Staff informed EMS that the patient has had a fever since Monday and has not been eating well.  Pt is reportedly nonverbal at baseline and does not follow commands.

## 2023-03-18 NOTE — Progress Notes (Signed)
 Pharmacy Antibiotic Note  Jonathan Berry is a 78 y.o. male admitted on 03/18/2023 with signs of sepsis.  Pharmacy has been consulted for vancomycin and cefepime dosing. Patient is febrile (Tmax 101.1), WBC 10.1, Scr 1.08.  Plan: Vancomycin 1000 IV every 24 hours.  Goal trough 15-20 mcg/mL.Calculated AUC 407. Cefepime 2000 mg IV every 8 hours    Temp (24hrs), Avg:101.1 F (38.4 C), Min:101.1 F (38.4 C), Max:101.1 F (38.4 C)  Recent Labs  Lab 03/18/23 1508 03/18/23 1515  WBC 10.1  --   CREATININE 1.08  --   LATICACIDVEN  --  1.6    CrCl cannot be calculated (Unknown ideal weight.).    No Known Allergies  Antimicrobials this admission: Vancomycin 3/5 >>  Cefepime 3/5 >>   Dose adjustments this admission:   Microbiology results: 3/5 BCx:   Thank you for allowing pharmacy to be a part of this patient's care.  Wilmer Floor 03/18/2023 7:22 PM

## 2023-03-18 NOTE — Assessment & Plan Note (Addendum)
 Wife agrees with FULL DNR, with no intervention.  Conservative medical measures no feeding tube.  No CPR/ NO ventilation.

## 2023-03-18 NOTE — Hospital Course (Signed)
 Jonathan Berry

## 2023-03-18 NOTE — ED Provider Notes (Signed)
  Provider Note MRN:  [id number]  Arrival date & time: 03/18/23    ED Course and Medical Decision Making  Assumed care from Dr Fredderick Phenix at shift change.   Clinical Course as of 03/18/23 1709  Wed Mar 18, 2023  1621 Handoff from [facility] Plan admission 78 yo male, hx dementia, seizure here w/ fever Temp 101.1, HyperNa @150  SIRS, unclear source Undiff sepsis abx with cultures ordered Hemodynamically he is stable Admit for SIRS/ fever [SG]    Clinical Course User Index [SG] Sloan Leiter, DO     Plan per prior physician admit Spoke with [facility] for admission   .Critical Care  Performed by: Sloan Leiter, DO Authorized by: Sloan Leiter, DO   Critical care provider statement:    Critical care time (minutes):  31   Critical care time was exclusive of:  Separately billable procedures and treating other patients   Critical care was necessary to treat or prevent imminent or life-threatening deterioration of the following conditions:  Metabolic crisis   Critical care was time spent personally by me on the following activities:  Development of treatment plan with patient or surrogate, discussions with consultants, evaluation of patient's response to treatment, examination of patient, ordering and review of laboratory studies, ordering and review of radiographic studies, ordering and performing treatments and interventions, pulse oximetry, re-evaluation of patient's condition and review of old charts   Care discussed with: admitting provider     Final Clinical Impressions(s) / ED Diagnoses     ICD-10-CM   1. Sepsis without acute organ dysfunction, due to unspecified organism (HCC)  A41.9     2. Hypernatremia  E87.0       ED Discharge Orders     None       Discharge Instructions   None        Sloan Leiter, DO 03/18/23 1709

## 2023-03-18 NOTE — Assessment & Plan Note (Signed)
 2/2 GI iIlness.  Collect stool cultures and studies.

## 2023-03-18 NOTE — ED Provider Notes (Signed)
 [place] EMERGENCY DEPARTMENT AT [facility] Provider Note   CSN: [id number] Arrival date & time: 03/18/23  1449     History  Chief Complaint  Patient presents with   Fever    Jonathan Berry is a 78 y.o. male.  Patient is a 78 year old male with a history of dementia, seizures, hyperlipidemia who presents with fever.  History is limited due to his dementia.  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  Per EMS, he is at his baseline, nonverbal state per nursing home report.  He has had a recent fever.  He has had decreased oral intake.  Reportedly there is been a GI bug going around the nursing home.       Home Medications Prior to Admission medications   Medication Sig Start Date End Date Taking? Authorizing Provider  acetaminophen (TYLENOL) 500 MG tablet Take 500 mg by mouth every 6 (six) hours as needed for mild pain, moderate pain or fever.    [provider]  apixaban (ELIQUIS) 5 MG TABS tablet Take 1 tablet (5 mg total) by mouth 2 (two) times daily. 01/17/21   Lewie Chamber, MD  divalproex (DEPAKOTE) 250 MG DR tablet Take 2 tablets (500 mg total) by mouth 2 (two) times daily for 3 days. Patient not taking: Reported on 04/08/2021 01/17/21 01/20/21  Lewie Chamber, MD  donepezil (ARICEPT) 10 MG tablet Take 10 mg by mouth at bedtime.    [provider]  lacosamide (VIMPAT) 50 MG TABS tablet Take 1 tablet (50 mg total) by mouth 2 (two) times daily. 04/08/21   Benjiman Core, MD  LORazepam (ATIVAN) 1 MG tablet Take 0.5 tablets (0.5 mg total) by mouth 2 (two) times daily as needed for anxiety. 01/10/21   Marinda Elk, MD  Polyethyl Glycol-Propyl Glycol (SYSTANE) 0.4-0.3 % SOLN Place 2 drops into both eyes 4 (four) times daily as needed (dry eyes/redness/irritation/discomfort).    [provider]  sertraline (ZOLOFT) 50 MG tablet Take 50 mg by mouth daily. 06/04/22   [provider]  ZINC OXIDE, TOPICAL, (SECURA PROTECTIVE) 10 % CREA  Apply 1 Application topically daily as needed (excessive soiling).    [provider]      Allergies    Patient has no known allergies.    Review of Systems   Review of Systems  Unable to perform ROS: Patient nonverbal    Physical Exam Updated Vital Signs BP (!) 118/97   Pulse 86   Temp (!) 101.1 F (38.4 C) (Rectal)   Resp 17   SpO2 94%  Physical Exam Constitutional:      Appearance: He is well-developed.     Comments: Patient has eyes closed but will open them to verbal stimuli  HENT:     Head: Normocephalic and atraumatic.  Eyes:     Pupils: Pupils are equal, round, and reactive to light.  Cardiovascular:     Rate and Rhythm: Regular rhythm. Tachycardia present.     Heart sounds: Normal heart sounds.  Pulmonary:     Effort: Pulmonary effort is normal. No respiratory distress.     Breath sounds: Normal breath sounds. No wheezing or rales.  Chest:     Chest wall: No tenderness.  Abdominal:     General: Bowel sounds are normal.     Palpations: Abdomen is soft.     Tenderness: There is no abdominal tenderness. There is no guarding or rebound.  Musculoskeletal:  General: Normal range of motion.     Cervical back: Normal range of motion and neck supple.  Lymphadenopathy:     Cervical: No cervical adenopathy.  Skin:    General: Skin is warm and dry.     Findings: No rash.  Neurological:     Comments: Will open his eyes to verbal stimuli.  He is moving his extremities apparently symmetrically.  He is got a tremor which reportedly is at baseline     ED Results / Procedures / Treatments   Labs (all labs ordered are listed, but only abnormal results are displayed) Labs Reviewed  COMPREHENSIVE METABOLIC PANEL - Abnormal; Notable for the following components:      Result Value   Sodium 150 (*)    Glucose, Bld 108 (*)    BUN 28 (*)    AST 87 (*)    Anion gap 18 (*)    All other components within normal limits  PROTIME-INR - Abnormal; Notable for the  following components:   Prothrombin Time 18.2 (*)    INR 1.5 (*)    All other components within normal limits  RESP PANEL BY RT-PCR (RSV, FLU A&B, COVID)  RVPGX2  CULTURE, BLOOD (ROUTINE X 2)  CULTURE, BLOOD (ROUTINE X 2)  CBC WITH DIFFERENTIAL/PLATELET  APTT  URINALYSIS, W/ REFLEX TO CULTURE (INFECTION SUSPECTED)  I-STAT CG4 LACTIC ACID, ED    EKG EKG Interpretation Date/Time:  Wednesday March 18 2023 15:09:11 EST Ventricular Rate:  96 PR Interval:  138 QRS Duration:  72 QT Interval:  407 QTC Calculation: 515 R Axis:   10  Text Interpretation: Sinus rhythm Borderline repolarization abnormality Prolonged QT interval Confirmed by Rolan Bucco [phone]) on 03/18/2023 3:53:51 PM  Radiology No results found.  Procedures Procedures    Medications Ordered in ED Medications  lactated ringers infusion (has no administration in time range)  ceFEPIme (MAXIPIME) 2 g in sodium chloride 0.9 % 100 mL IVPB (has no administration in time range)  metroNIDAZOLE (FLAGYL) IVPB 500 mg (has no administration in time range)  vancomycin (VANCOCIN) IVPB 1000 mg/200 mL premix (has no administration in time range)  acetaminophen (TYLENOL) suppository 650 mg (650 mg Rectal Given 03/18/23 1603)  sodium chloride 0.9 % bolus 500 mL (500 mLs Intravenous New Bag/Given 03/18/23 1608)    ED Course/ Medical Decision Making/ A&P Clinical Course as of 03/18/23 1635  Wed Mar 18, 2023  1621 Handoff from [facility] Plan admission Temp 101.1, HyperNa @150  SIRS, unclear source [SG]    Clinical Course User Index [SG] Sloan Leiter, DO                                 Medical Decision Making Amount and/or Complexity of Data Reviewed Labs: ordered. Radiology: ordered.  Risk OTC drugs. Prescription drug management.   Patient is a 78 year old male who presents with fever.  He is nonverbal at baseline.  His vital signs showed some mild tachycardia initially but this has resolved.  He has not been hypotensive.  His  labs show normal white count.  His lactate is normal.  His sodium is little bit elevated.  He did not have any abdominal pain on exam.  Chest x-ray was performed which was interpreted by me to show no obvious pneumonia.  His COVID/flu was negative.  Will go ahead and start him on antibiotics for unclear source and start IV fluids.  Urinalysis is pending.  Will  plan admission to the hospitalist service.  CRITICAL CARE Performed by: Rolan Bucco Total critical care time: 60 minutes Critical care time was exclusive of separately billable procedures and treating other patients. Critical care was necessary to treat or prevent imminent or life-threatening deterioration. Critical care was time spent personally by me on the following activities: development of treatment plan with patient and/or surrogate as well as nursing, discussions with consultants, evaluation of patient's response to treatment, examination of patient, obtaining history from patient or surrogate, ordering and performing treatments and interventions, ordering and review of laboratory studies, ordering and review of radiographic studies, pulse oximetry and re-evaluation of patient's condition.   Final Clinical Impression(s) / ED Diagnoses Final diagnoses:  Sepsis without acute organ dysfunction, due to unspecified organism [facility])    Rx / DC Orders ED Discharge Orders     None         Rolan Bucco, MD 03/18/23 [phone]

## 2023-03-18 NOTE — Assessment & Plan Note (Signed)
 Will start pt on heparin until he is alert and taking

## 2023-03-18 NOTE — Sepsis Progress Note (Signed)
 eLink is following this Code Sepsis.

## 2023-03-18 NOTE — Assessment & Plan Note (Signed)
    Latest Ref Rng & Units 03/18/2023    3:08 PM 10/06/2022    6:40 AM 09/15/2021    5:52 PM  BMP  Glucose 70 - 99 mg/dL 161  096  045   BUN 8 - 23 mg/dL 28  13  16    Creatinine 0.61 - 1.24 mg/dL 4.09  8.11  9.14   Sodium 135 - 145 mmol/L 150  139  140   Potassium 3.5 - 5.1 mmol/L 4.2  3.5  3.7   Chloride 98 - 111 mmol/L 108  104  104   CO2 22 - 32 mmol/L 24  23    Calcium 8.9 - 10.3 mg/dL 9.5  8.6    2/2 to dehydration possibly from diarrhea.  D5W at 75 cc x 24 hours.

## 2023-03-18 NOTE — Assessment & Plan Note (Signed)
 Seizure precaution.  Will d/w pharm to convert to IV meds.

## 2023-03-19 DIAGNOSIS — E870 Hyperosmolality and hypernatremia: Secondary | ICD-10-CM | POA: Diagnosis not present

## 2023-03-19 LAB — BLOOD CULTURE ID PANEL (REFLEXED) - BCID2

## 2023-03-19 LAB — COMPREHENSIVE METABOLIC PANEL
ALT: 32 U/L (ref 0–44)
AST: 65 U/L — ABNORMAL HIGH (ref 15–41)
Albumin: 3.1 g/dL — ABNORMAL LOW (ref 3.5–5.0)
Alkaline Phosphatase: 53 U/L (ref 38–126)
Anion gap: 11 (ref 5–15)
BUN: 23 mg/dL (ref 8–23)
CO2: 21 mmol/L — ABNORMAL LOW (ref 22–32)
Calcium: 8.3 mg/dL — ABNORMAL LOW (ref 8.9–10.3)
Chloride: 113 mmol/L — ABNORMAL HIGH (ref 98–111)
Creatinine, Ser: 0.97 mg/dL (ref 0.61–1.24)
GFR, Estimated: >60 mL/min (ref >60)
Glucose, Bld: 183 mg/dL — ABNORMAL HIGH (ref 70–99)
Potassium: 4.1 mmol/L (ref 3.5–5.1)
Sodium: 145 mmol/L (ref 135–145)
Total Bilirubin: 1 mg/dL (ref 0.0–1.2)
Total Protein: 5.9 g/dL — ABNORMAL LOW (ref 6.5–8.1)

## 2023-03-19 LAB — APTT
aPTT: 62 s — ABNORMAL HIGH (ref 24–36)
aPTT: 91 s — ABNORMAL HIGH (ref 24–36)

## 2023-03-19 LAB — CORTISOL-AM, BLOOD: Cortisol - AM: 9.3 ug/dL (ref 6.7–22.6)

## 2023-03-19 LAB — CBC
HCT: 41.8 % (ref 39.0–52.0)
Hemoglobin: 13.7 g/dL (ref 13.0–17.0)
MCH: 28.8 pg (ref 26.0–34.0)
MCHC: 32.8 g/dL (ref 30.0–36.0)
MCV: 87.8 fL (ref 80.0–100.0)
Platelets: 243 10*3/uL (ref 150–400)
RBC: 4.76 MIL/uL (ref 4.22–5.81)
RDW: 13 % (ref 11.5–15.5)
WBC: 10.6 10*3/uL — ABNORMAL HIGH (ref 4.0–10.5)
nRBC: 0 % (ref 0.0–0.2)

## 2023-03-19 LAB — URINALYSIS, ROUTINE W REFLEX MICROSCOPIC
Bilirubin Urine: NEGATIVE
Glucose, UA: NEGATIVE mg/dL
Ketones, ur: 5 mg/dL — AB
Leukocytes,Ua: NEGATIVE
Nitrite: NEGATIVE
Protein, ur: 30 mg/dL — AB
Specific Gravity, Urine: 1.035 — ABNORMAL HIGH (ref 1.005–1.030)
pH: 5 (ref 5.0–8.0)

## 2023-03-19 LAB — CK: Total CK: 710 U/L — ABNORMAL HIGH (ref 49–397)

## 2023-03-19 LAB — PROTIME-INR
INR: 1.4 — ABNORMAL HIGH (ref 0.8–1.2)
Prothrombin Time: 17.6 s — ABNORMAL HIGH (ref 11.4–15.2)

## 2023-03-19 LAB — MAGNESIUM: Magnesium: 2 mg/dL (ref 1.7–2.4)

## 2023-03-19 LAB — PHOSPHORUS: Phosphorus: 3.5 mg/dL (ref 2.5–4.6)

## 2023-03-19 MED ORDER — LORAZEPAM 0.5 MG PO TABS: 0.5000 mg | ORAL_TABLET | Freq: Two times a day (BID) | ORAL | Status: DC | PRN

## 2023-03-19 MED ORDER — SERTRALINE HCL 50 MG PO TABS
50.0000 mg | ORAL_TABLET | Freq: Every day | ORAL | Status: DC
Start: 2023-03-20 — End: 2023-03-24
  Administered 2023-03-20: 50 mg via ORAL
  Filled 2023-03-19 (×2): qty 1

## 2023-03-19 MED ORDER — SODIUM CHLORIDE 0.9 % IV SOLN
2.0000 g | Freq: Every day | INTRAVENOUS | Status: DC
  Administered 2023-03-19 – 2023-03-24 (×6): 2 g via INTRAVENOUS
  Filled 2023-03-19 (×6): qty 20

## 2023-03-19 MED ORDER — LACOSAMIDE 50 MG PO TABS
50.0000 mg | ORAL_TABLET | Freq: Two times a day (BID) | ORAL | Status: DC
  Administered 2023-03-19 – 2023-03-20 (×2): 50 mg via ORAL
  Filled 2023-03-19 (×2): qty 1

## 2023-03-19 MED ORDER — DONEPEZIL HCL 10 MG PO TABS
10.0000 mg | ORAL_TABLET | Freq: Every day | ORAL | Status: DC
  Administered 2023-03-19 – 2023-03-23 (×3): 10 mg via ORAL
  Filled 2023-03-19 (×3): qty 1

## 2023-03-19 MED ORDER — VANCOMYCIN HCL IN DEXTROSE 1-5 GM/200ML-% IV SOLN
1000.0000 mg | INTRAVENOUS | Status: DC
  Administered 2023-03-19: 1000 mg via INTRAVENOUS
  Filled 2023-03-19: qty 200

## 2023-03-19 NOTE — Plan of Care (Signed)
  Problem: Fluid Volume: Goal: Hemodynamic stability will improve Outcome: Progressing   Problem: Education: Goal: Knowledge of General Education information will improve Description: Including pain rating scale, medication(s)/side effects and non-pharmacologic comfort measures Outcome: Progressing   Problem: Activity: Goal: Risk for activity intolerance will decrease Outcome: Progressing   Problem: Pain Managment: Goal: General experience of comfort will improve and/or be controlled Outcome: Progressing   Problem: Safety: Goal: Ability to remain free from injury will improve Outcome: Progressing   Problem: Skin Integrity: Goal: Risk for impaired skin integrity will decrease Outcome: Progressing

## 2023-03-19 NOTE — Plan of Care (Signed)
  Problem: Fluid Volume: Goal: Hemodynamic stability will improve Outcome: Progressing   Problem: Clinical Measurements: Goal: Diagnostic test results will improve Outcome: Progressing   Problem: Clinical Measurements: Goal: Signs and symptoms of infection will decrease Outcome: Progressing   Problem: Nutrition: Goal: Adequate nutrition will be maintained Outcome: Progressing

## 2023-03-19 NOTE — Evaluation (Signed)
 Clinical/Bedside Swallow Evaluation Patient Details  Name: Jonathan Berry MRN: [id number] Date of Birth: 1945/11/12  Today's Date: 03/19/2023 Time: SLP Start Time (ACUTE ONLY): 1145 SLP Stop Time (ACUTE ONLY): 1208 SLP Time Calculation (min) (ACUTE ONLY): 23 min  Past Medical History:  Past Medical History:  Diagnosis Date   Alzheimer disease (HCC)    COVID    Hypercholesteremia    Hypertension    Seizures (HCC)    Past Surgical History:  Past Surgical History:  Procedure Laterality Date   KNEE SURGERY     MANDIBLE SURGERY     HPI:  78yo male admitted from [facility] memory care 03/18/23 with fever, decreased PO intake. PMH: seizures, HLD, dementia - nonverbal, PE. CXR negative for PNA.    Assessment / Plan / Recommendation  Clinical Impression  Pt does not exhibit obvious oral issues or overt s/s aspiration. Recommend dys2/thin liquid, primarily for energy conservation.   Pt awake and alert. No family present. Unable to complete oral care or assess orofacial strength/function, as pt tightened closed lips and would not allow SLP to visualize or clean oral cavity. He was unable to follow commands as well. Nonverbal, but vocalizations were clear (not wet). Pt accepted trials of ice chips, thin liquid via straw, puree, and solid textures. No obvious oral issues. Pt exhibited an audible swallow with thin liquid, but no overt s/s aspiration given multiple trials of thin, puree, and solid textures. Recommend beginning dys2/thin liquid, meds crushed in puree. Safe swallow precautions left with RN to post in pt room. SLP will follow to assess diet tolerance, determine appropriateness for advanced textures, and continue education.  SLP Visit Diagnosis: Dysphagia, unspecified (R13.10)    Aspiration Risk  Mild aspiration risk    Diet Recommendation Dysphagia 2 (Fine chop);Thin liquid    Liquid Administration via: Straw;Spoon Medication Administration: Crushed with puree Supervision: Staff  to assist with self feeding;Full supervision/cueing for compensatory strategies Compensations: Minimize environmental distractions;Slow rate;Small sips/bites Postural Changes: Seated upright at 90 degrees;Remain upright for at least 30 minutes after po intake    Other  Recommendations Oral Care Recommendations: Oral care BID;Staff/trained caregiver to provide oral care    Recommendations for follow up therapy are one component of a multi-disciplinary discharge planning process, led by the attending physician.  Recommendations may be updated based on patient status, additional functional criteria and insurance authorization.  Follow up Recommendations Follow physician's recommendations for discharge plan and follow up therapies      Assistance Recommended at Discharge  Memory care unit  Functional Status Assessment Patient has not had a recent decline in their functional status  Frequency and Duration min 1 x/week  1 week;2 weeks       Prognosis Prognosis for improved oropharyngeal function: Fair Barriers to Reach Goals: Cognitive deficits      Swallow Study   General Date of Onset: 03/18/23 HPI: 78yo male admitted from [facility] memory care 03/18/23 with fever, decreased PO intake. PMH: seizures, HLD, dementia - nonverbal, PE. CXR negative for PNA. Type of Study: Bedside Swallow Evaluation Previous Swallow Assessment: BSE 01/17/21 = reg/thin Diet Prior to this Study: NPO Temperature Spikes Noted: Yes (101 on admit) Respiratory Status: Room air History of Recent Intubation: No Behavior/Cognition: Alert;Cooperative;Doesn't follow directions Oral Cavity Assessment: Dry Oral Care Completed by SLP: No (pt would not open mouth to allow oral care to be performed) Oral Cavity - Dentition: Other (Comment) (unable to visualize) Vision: Functional for self-feeding Self-Feeding Abilities: Needs assist Patient Positioning: Upright  in bed Baseline Vocal Quality: Normal Volitional Cough:  Cognitively unable to elicit Volitional Swallow: Unable to elicit    Oral/Motor/Sensory Function Overall Oral Motor/Sensory Function: Other (comment) (unable to assess)   Ice Chips Ice chips: Within functional limits Presentation: Spoon   Thin Liquid Thin Liquid: Impaired Presentation: Straw Pharyngeal  Phase Impairments: Other (comments) (audible swallow)    Nectar Thick Nectar Thick Liquid: Not tested   Honey Thick Honey Thick Liquid: Not tested   Puree Puree: Within functional limits Presentation: Spoon   Solid     Solid: Within functional limits Presentation: Self Fed (required assistance)     Jonathan Berry B. Jonathan Berry, Jonathan Berry, CCC-SLP Speech Language Pathologist Office: [phone]  Jonathan Berry 03/19/2023,12:19 PM

## 2023-03-19 NOTE — Progress Notes (Signed)
 PHARMACY - ANTICOAGULATION CONSULT NOTE  Pharmacy Consult for heparin Indication:  h/o VTE  Labs: Recent Labs    03/18/23 1508 03/19/23 0318  HGB 15.6  --   HCT 47.9  --   PLT 269  --   APTT 33 62*  LABPROT 18.2* 17.6*  INR 1.5* 1.4*  CREATININE 1.08  --   CKTOTAL 1,433*  --    Assessment: 77yo male subtherapeutic on heparin with initial dosing while DOAC on hold; no infusion issues or signs of bleeding per RN.  Goal of Therapy:  aPTT 66-102 seconds   Plan:  Increase heparin infusion by 1-2 units/kg/hr to 1000 units/hr. Check PTT in 8 hours.   Vernard Gambles, PharmD, BCPS 03/19/2023 3:51 AM

## 2023-03-19 NOTE — Progress Notes (Addendum)
 PROGRESS NOTE    Jonathan Berry  WUJ:[id number] DOB: 1945-10-26 DOA: 03/18/2023 PCP: Collene Mares, [place]   Brief Narrative: 78 year old with past medical history significant for seizure, dementia, pulmonary embolism and PE December 2022 presented via EMS from memory care facility due to fever, poor oral intake, patient is nonverbal at baseline.  Admission was requested for sepsis.  Per wife patient is nonverbal at baseline, say occasional words.  Norovirus outbreak at his facility.  Patient had diarrhea and fever was sent for further evaluation.   Assessment & Plan:   Principal Problem:   Fever Active Problems:   DNR (do not resuscitate)   Essential hypertension   Pulmonary emboli (HCC)   Seizure (HCC)   Alzheimer disease (HCC)   Sepsis (HCC)   Hypernatremia  1-Fever, HO of Diarrhea;  -Facility with Norovirus outbreak.  -GI pathogen and CT order -Urinalysis ordered, urine culture ordered -Respiratory panel negative -Influenza A, B and RSV negative -Chest x-ray: No active disease  Hypernatremia, dehydration: -In the setting of poor oral intake dehydration -Continue with IV fluids D5   Sepsis: -Presents with fever temperature 101, respiration rate 27, tachycardia -No source of infection identified at this point but there is possible norovirus outbreak at his facility -Continue with IV fluids and IV antibiotics -Chest X-ray no active disease -GI pathogen, C diff ordered.   Alzheimer  disease: -Resume Aricept.   Seizure disorder -resume Vimpat.   History of PE -On heparin gtt, transition to eliquis when taking orals.   Essential hypertension -PRN Hydralazine.  -not on meds prior to admission.       Estimated body mass index is 20.25 kg/m as calculated from the following:   Height as of 10/06/22: 5\' 8"  (1.727 m).   Weight as of 10/06/22: 60.4 kg.   DVT prophylaxis: heparin  Code Status: Full code Family Communication: Care  discussed with Wife who was at  bedside. Called wife spoke over phone, she agrees with DNR, No ICU transfer to central line, if things get worse make sure he is comfortable. Plan to treat Infection, continue with Fluids, antibiotics Disposition Plan:  Status is: Inpatient Remains inpatient appropriate because: Management of dehydration.     Consultants:  None  Procedures:  None  Antimicrobials:    Subjective: He is alert, said "OK" Per wife patient is only able to say a few words some times.   Objective: Vitals:   03/19/23 0538 03/19/23 0545 03/19/23 0600 03/19/23 0615  BP:  (!) 154/104 (!) 171/89 (!) 157/93  Pulse:  70 (!) 181 98  Resp:  14 16 17   Temp: 97.8 F (36.6 C)     TempSrc: Oral     SpO2:  98% 94% 96%   No intake or output data in the 24 hours ending 03/19/23 0808 There were no vitals filed for this visit.  Examination:  General exam: Appears calm and comfortable  Respiratory system: Clear to auscultation. Respiratory effort normal. Cardiovascular system: S1 & S2 heard, RRR. No JVD, murmurs, rubs, gallops or clicks. No pedal edema. Gastrointestinal system: Abdomen is nondistended, soft and nontender. No organomegaly or masses felt. Normal bowel sounds heard. Central nervous system: Alert  Extremities: No edema     Data Reviewed: I have personally reviewed following labs and imaging studies  CBC: Recent Labs  Lab 03/18/23 1508 03/19/23 0548  WBC 10.1 10.6*  NEUTROABS 7.6  --   HGB 15.6 13.7  HCT 47.9 41.8  MCV 88.7 87.8  PLT 269 243  Basic Metabolic Panel: Recent Labs  Lab 03/18/23 1508 03/19/23 0544  NA 150* 145  K 4.2 4.1  CL 108 113*  CO2 24 21*  GLUCOSE 108* 183*  BUN 28* 23  CREATININE 1.08 0.97  CALCIUM 9.5 8.3*  MG  --  2.0  PHOS  --  3.5   GFR: CrCl cannot be calculated (Unknown ideal weight.). Liver Function Tests: Recent Labs  Lab 03/18/23 1508 03/19/23 0544  AST 87* 65*  ALT 36 32  ALKPHOS 60 53  BILITOT 0.8 1.0  PROT 7.1 5.9*  ALBUMIN 3.6  3.1*   No results for input(s): "LIPASE", "AMYLASE" in the last 168 hours. No results for input(s): "AMMONIA" in the last 168 hours. Coagulation Profile: Recent Labs  Lab 03/18/23 1508 03/19/23 0318  INR 1.5* 1.4*   Cardiac Enzymes: Recent Labs  Lab 03/18/23 1508  CKTOTAL 1,433*   BNP (last 3 results) No results for input(s): "PROBNP" in the last 8760 hours. HbA1C: No results for input(s): "HGBA1C" in the last 72 hours. CBG: Recent Labs  Lab 03/18/23 1952  GLUCAP 145*   Lipid Profile: No results for input(s): "CHOL", "HDL", "LDLCALC", "TRIG", "CHOLHDL", "LDLDIRECT" in the last 72 hours. Thyroid Function Tests: No results for input(s): "TSH", "T4TOTAL", "FREET4", "T3FREE", "THYROIDAB" in the last 72 hours. Anemia Panel: No results for input(s): "VITAMINB12", "FOLATE", "FERRITIN", "TIBC", "IRON", "RETICCTPCT" in the last 72 hours. Sepsis Labs: Recent Labs  Lab 03/18/23 1515  LATICACIDVEN 1.6    Recent Results (from the past 240 hours)  Resp panel by RT-PCR (RSV, Flu A&B, Covid) Anterior Nasal Swab     Status: None   Collection Time: 03/18/23  3:11 PM   Specimen: Anterior Nasal Swab  Result Value Ref Range Status   SARS Coronavirus 2 by RT PCR NEGATIVE NEGATIVE Final   Influenza A by PCR NEGATIVE NEGATIVE Final   Influenza B by PCR NEGATIVE NEGATIVE Final    Comment: (NOTE) The Xpert Xpress SARS-CoV-2/FLU/RSV plus assay is intended as an aid in the diagnosis of influenza from Nasopharyngeal swab specimens and should not be used as a sole basis for treatment. Nasal washings and aspirates are unacceptable for Xpert Xpress SARS-CoV-2/FLU/RSV testing.  Fact Sheet for Patients: BloggerCourse.com  Fact Sheet for Healthcare Providers: SeriousBroker.it  This test is not yet approved or cleared by the [place] FDA and has been authorized for detection and/or diagnosis of SARS-CoV-2 by FDA under an Emergency Use  Authorization (EUA). This EUA will remain in effect (meaning this test can be used) for the duration of the COVID-19 declaration under Section 564(b)(1) of the Act, 21 U.S.C. section 360bbb-3(b)(1), unless the authorization is terminated or revoked.     Resp Syncytial Virus by PCR NEGATIVE NEGATIVE Final    Comment: (NOTE) Fact Sheet for Patients: BloggerCourse.com  Fact Sheet for Healthcare Providers: SeriousBroker.it  This test is not yet approved or cleared by the [place] FDA and has been authorized for detection and/or diagnosis of SARS-CoV-2 by FDA under an Emergency Use Authorization (EUA). This EUA will remain in effect (meaning this test can be used) for the duration of the COVID-19 declaration under Section 564(b)(1) of the Act, 21 U.S.C. section 360bbb-3(b)(1), unless the authorization is terminated or revoked.  Performed at [facility], [address] [place], [place] [address]   Respiratory (~20 pathogens) panel by PCR     Status: None   Collection Time: 03/18/23  8:38 PM   Specimen: Nasopharyngeal Swab; Respiratory  Result Value  Ref Range Status   Adenovirus NOT DETECTED NOT DETECTED Final   Coronavirus 229E NOT DETECTED NOT DETECTED Final    Comment: (NOTE) The Coronavirus on the Respiratory Panel, DOES NOT test for the novel  Coronavirus (2019 nCoV)    Coronavirus HKU1 NOT DETECTED NOT DETECTED Final   Coronavirus NL63 NOT DETECTED NOT DETECTED Final   Coronavirus OC43 NOT DETECTED NOT DETECTED Final   Metapneumovirus NOT DETECTED NOT DETECTED Final   Rhinovirus / Enterovirus NOT DETECTED NOT DETECTED Final   Influenza A NOT DETECTED NOT DETECTED Final   Influenza B NOT DETECTED NOT DETECTED Final   Parainfluenza Virus 1 NOT DETECTED NOT DETECTED Final   Parainfluenza Virus 2 NOT DETECTED NOT DETECTED Final   Parainfluenza Virus 3 NOT DETECTED NOT DETECTED Final   Parainfluenza Virus 4 NOT  DETECTED NOT DETECTED Final   Respiratory Syncytial Virus NOT DETECTED NOT DETECTED Final   Bordetella pertussis NOT DETECTED NOT DETECTED Final   Bordetella Parapertussis NOT DETECTED NOT DETECTED Final   Chlamydophila pneumoniae NOT DETECTED NOT DETECTED Final   Mycoplasma pneumoniae NOT DETECTED NOT DETECTED Final    Comment: Performed at [facility], [address] [place], [place] [address]         Radiology Studies: CT HEAD WO CONTRAST ( ) Result Date: 03/18/2023 CLINICAL DATA:  Mental status change, unknown cause EXAM: CT HEAD WITHOUT CONTRAST TECHNIQUE: Contiguous axial images were obtained from the base of the skull through the vertex without intravenous contrast. RADIATION DOSE REDUCTION: This exam was performed according to the departmental dose-optimization program which includes automated exposure control, adjustment of the mA and/or kV according to patient size and/or use of iterative reconstruction technique. COMPARISON:  CT head 10/06/2022 FINDINGS: Brain: No intracranial hemorrhage, mass effect, or evidence of acute infarct. No hydrocephalus. No extra-axial fluid collection. Age advanced cerebral atrophy. Chronic small vessel ischemic disease. Vascular: No hyperdense vessel. Intracranial arterial calcification. Skull: No fracture or focal lesion. Sinuses/Orbits: No acute finding. Other: None. IMPRESSION: 1. No acute intracranial abnormality. 2. Age advanced cerebral atrophy. Electronically Signed   By: Minerva Fester M.D.   On: 03/18/2023 21:19   DG Chest Port 1 View Result Date: 03/18/2023 CLINICAL DATA:  Sepsis. EXAM: PORTABLE CHEST 1 VIEW COMPARISON:  09/15/2021 FINDINGS: The heart size and mediastinal contours are within normal limits. Both lungs are clear. The visualized skeletal structures are unremarkable. IMPRESSION: No active disease. Electronically Signed   By: Danae Orleans M.D.   On: 03/18/2023 18:07        Scheduled Meds:  pantoprazole (PROTONIX) IV  40  mg Intravenous Q12H   thiamine (VITAMIN B1) injection  100 mg Intravenous Daily   Continuous Infusions:  dextrose 75 mL/hr at 03/18/23 2245   heparin 1,000 Units/hr (03/19/23 0353)   metronidazole 500 mg (03/19/23 0536)     LOS: 1 day    Time spent: 35 minutes    Tanashia Ciesla A Orin Eberwein, MD [facility]   If 7PM-7AM, please contact night-coverage [email or web address]  03/19/2023, 8:08 AM

## 2023-03-19 NOTE — Progress Notes (Signed)
 PHARMACY - PHYSICIAN COMMUNICATION CRITICAL VALUE ALERT - BLOOD CULTURE IDENTIFICATION (BCID)  Jonathan Berry is an 78 y.o. male who presented to [facility] on 03/18/2023 with a chief complaint of fever, diarrhea, and poor oral intake.  Assessment:  blood cultures 1/4 staph species, likely contaminant. Continue antibiotics until final culture.  Current antibiotics: vancomycin, ceftriaxone, metronidazole  Changes to prescribed antibiotics recommended:  Patient is on recommended antibiotics - No changes needed  No results found for this or any previous visit.   Alphia Moh, PharmD, BCPS, BCCP Clinical Pharmacist  Please check AMION for all [facility] phone numbers After 10:00 PM, call Main Pharmacy [phone]

## 2023-03-19 NOTE — Progress Notes (Addendum)
 PHARMACY - ANTICOAGULATION CONSULT NOTE  Pharmacy Consult for heparin Indication:  h/o VTE  Labs: Recent Labs    03/18/23 1508 03/19/23 0318 03/19/23 0544 03/19/23 0548 03/19/23 1351 03/19/23 1624  HGB 15.6  --   --  13.7  --   --   HCT 47.9  --   --  41.8  --   --   PLT 269  --   --  243  --   --   APTT 33 62*  --   --   --  91*  LABPROT 18.2* 17.6*  --   --   --   --   INR 1.5* 1.4*  --   --   --   --   CREATININE 1.08  --  0.97  --   --   --   CKTOTAL 1,433*  --   --   --  710*  --    Assessment: 78yo male with hx PE 2022 on apixaban PTA. Holding pending further work up. Pharmacy consulted for heparin.    APTT 91 is therapeutic on 1000 units/hr.  No issues with infusion or bleeding per RN.    Goal of Therapy:  aPTT 66-102 seconds Monitor platelets per anticoagulation protocol: yes  Plan:  Decrease heparin to 950 units/hr F/u aPTT until correlates with heparin level  Monitor daily aptt, heparin level, CBC, signs/symptoms of bleeding  F/u restart apixaban    Alphia Moh, PharmD, BCPS, [facility] Clinical Pharmacist  Please check AMION for all [facility] phone numbers After 10:00 PM, call Main Pharmacy [phone]

## 2023-03-20 ENCOUNTER — Inpatient Hospital Stay (HOSPITAL_COMMUNITY)

## 2023-03-20 DIAGNOSIS — E870 Hyperosmolality and hypernatremia: Secondary | ICD-10-CM | POA: Diagnosis not present

## 2023-03-20 LAB — CULTURE, BLOOD (ROUTINE X 2): Special Requests: ADEQUATE

## 2023-03-20 LAB — GLUCOSE, CAPILLARY: Glucose-Capillary: 97 mg/dL (ref 70–99)

## 2023-03-20 LAB — CBC
HCT: 39.9 % (ref 39.0–52.0)
Hemoglobin: 13.6 g/dL (ref 13.0–17.0)
MCH: 29.5 pg (ref 26.0–34.0)
MCHC: 34.1 g/dL (ref 30.0–36.0)
MCV: 86.6 fL (ref 80.0–100.0)
Platelets: 233 10*3/uL (ref 150–400)
RBC: 4.61 MIL/uL (ref 4.22–5.81)
RDW: 12.3 % (ref 11.5–15.5)
WBC: 9.6 10*3/uL (ref 4.0–10.5)
nRBC: 0 % (ref 0.0–0.2)

## 2023-03-20 LAB — C DIFFICILE QUICK SCREEN W PCR REFLEX
C Diff antigen: NEGATIVE
C Diff interpretation: NOT DETECTED
C Diff toxin: NEGATIVE

## 2023-03-20 LAB — BASIC METABOLIC PANEL
Anion gap: 10 (ref 5–15)
BUN: 12 mg/dL (ref 8–23)
CO2: 26 mmol/L (ref 22–32)
Calcium: 8.6 mg/dL — ABNORMAL LOW (ref 8.9–10.3)
Chloride: 104 mmol/L (ref 98–111)
Creatinine, Ser: 0.83 mg/dL (ref 0.61–1.24)
GFR, Estimated: >60 mL/min (ref >60)
Glucose, Bld: 108 mg/dL — ABNORMAL HIGH (ref 70–99)
Potassium: 4.3 mmol/L (ref 3.5–5.1)
Sodium: 140 mmol/L (ref 135–145)

## 2023-03-20 LAB — APTT: aPTT: 199 s (ref 24–36)

## 2023-03-20 LAB — MAGNESIUM: Magnesium: 1.9 mg/dL (ref 1.7–2.4)

## 2023-03-20 LAB — HEPARIN LEVEL (UNFRACTIONATED): Heparin Unfractionated: >1.1 [IU]/mL — ABNORMAL HIGH (ref 0.30–0.70)

## 2023-03-20 MED ORDER — MORPHINE SULFATE (PF) 2 MG/ML IV SOLN
1.0000 mg | INTRAVENOUS | Status: DC | PRN
  Administered 2023-03-20 (×2): 1 mg via INTRAVENOUS
  Filled 2023-03-20 (×2): qty 1

## 2023-03-20 MED ORDER — ENOXAPARIN SODIUM 60 MG/0.6ML IJ SOSY
60.0000 mg | PREFILLED_SYRINGE | Freq: Two times a day (BID) | INTRAMUSCULAR | Status: DC
  Administered 2023-03-20 – 2023-03-24 (×8): 60 mg via SUBCUTANEOUS
  Filled 2023-03-20 (×8): qty 0.6

## 2023-03-20 MED ORDER — APIXABAN 5 MG PO TABS
5.0000 mg | ORAL_TABLET | Freq: Two times a day (BID) | ORAL | Status: DC
  Filled 2023-03-20: qty 1

## 2023-03-20 MED ORDER — SODIUM CHLORIDE 0.9 % IV SOLN
50.0000 mg | Freq: Two times a day (BID) | INTRAVENOUS | Status: DC
  Administered 2023-03-20 – 2023-03-23 (×7): 50 mg via INTRAVENOUS
  Filled 2023-03-20 (×9): qty 5

## 2023-03-20 MED ORDER — BUDESONIDE 0.25 MG/2ML IN SUSP
0.2500 mg | Freq: Two times a day (BID) | RESPIRATORY_TRACT | Status: DC
  Administered 2023-03-20 – 2023-03-24 (×6): 0.25 mg via RESPIRATORY_TRACT
  Filled 2023-03-20 (×8): qty 2

## 2023-03-20 MED ORDER — LORAZEPAM 2 MG/ML IJ SOLN
0.5000 mg | Freq: Four times a day (QID) | INTRAMUSCULAR | Status: DC | PRN
  Administered 2023-03-20: 0.5 mg via INTRAVENOUS
  Filled 2023-03-20: qty 1

## 2023-03-20 MED ORDER — BUDESONIDE 0.25 MG/2ML IN SUSP: 0.2500 mg | Freq: Two times a day (BID) | RESPIRATORY_TRACT | Status: DC

## 2023-03-20 MED ORDER — DEXTROSE 5 % IV SOLN: INTRAVENOUS | Status: DC

## 2023-03-20 MED ORDER — ALBUTEROL SULFATE (2.5 MG/3ML) 0.083% IN NEBU
2.5000 mg | INHALATION_SOLUTION | RESPIRATORY_TRACT | Status: DC | PRN
  Administered 2023-03-20: 2.5 mg via RESPIRATORY_TRACT
  Filled 2023-03-20: qty 3

## 2023-03-20 MED ORDER — TRIAMCINOLONE 0.1 % CREAM:EUCERIN CREAM 1:1
1.0000 | TOPICAL_CREAM | Freq: Two times a day (BID) | CUTANEOUS | Status: DC
  Administered 2023-03-20 – 2023-03-24 (×7): 1 via TOPICAL
  Filled 2023-03-20 (×2): qty 1

## 2023-03-20 NOTE — Care Management Important Message (Signed)
 Important Message  Patient Details  Name: Jonathan Berry MRN: [id number] Date of Birth: 1945-12-13   Important Message Given:  Yes - Medicare IM     Sherilyn Banker 03/20/2023, 12:39 PM

## 2023-03-20 NOTE — Progress Notes (Signed)
 PHARMACY - ANTICOAGULATION CONSULT NOTE  Pharmacy Consult for enoxaparin Indication: hx PE 12/2020  No Known Allergies  Patient Measurements:     Vital Signs: Temp: 98.4 F (36.9 C) (03/07 1351) Temp Source: Oral (03/07 1351) BP: 124/96 (03/07 1351) Pulse Rate: 69 (03/07 1351)  Labs: Recent Labs    03/18/23 1508 03/19/23 0318 03/19/23 0544 03/19/23 0548 03/19/23 1351 03/19/23 1624 03/20/23 1403  HGB 15.6  --   --  13.7  --   --  13.6  HCT 47.9  --   --  41.8  --   --  39.9  PLT 269  --   --  243  --   --  233  APTT 33 62*  --   --   --  91* 199*  LABPROT 18.2* 17.6*  --   --   --   --   --   INR 1.5* 1.4*  --   --   --   --   --   HEPARINUNFRC  --   --   --   --   --   --  >1.10*  CREATININE 1.08  --  0.97  --   --   --  0.83  CKTOTAL 1,433*  --   --   --  710*  --   --     CrCl cannot be calculated (Unknown ideal weight.).   Medical History: Past Medical History:  Diagnosis Date   Alzheimer disease (HCC)    COVID    Hypercholesteremia    Hypertension    Seizures (HCC)      Assessment: 78 yo M on apixaban PTA  for PE 12/2020. Apixaban held and pharmacy was consulted for heparin. Patient's aPTT and heparin level became unexpectedly high. Now pharmacy is consulted for enoxaparin.    aPTT up to 199 at 14:00 and heparin turned off at 15:15. No bleeding per RN. No weight charted. Last weight 10/06/22 was 60kg. Scr wnl.    Goal of Therapy:  Monitor platelets by anticoagulation protocol: Yes   Plan:  Enoxaparin 60mg  Q12 hr Monitor CBC/renal fx, signs/symptoms of bleeding    Alphia Moh, PharmD, BCPS, BCCP Clinical Pharmacist  Please check AMION for all [facility] phone numbers After 10:00 PM, call Main Pharmacy [phone]

## 2023-03-20 NOTE — Progress Notes (Addendum)
 PROGRESS NOTE    Jonathan Berry  ZOX:[id number] DOB: September 27, 1945 DOA: 03/18/2023 PCP: Collene Mares, [place]   Brief Narrative: 78 year old with past medical history significant for seizure, dementia, pulmonary embolism and PE December 2022 presented via EMS from memory care facility due to fever, poor oral intake, patient is nonverbal at baseline.  Admission was requested for sepsis.  Per wife patient is nonverbal at baseline, say occasional words.  Norovirus outbreak at his facility.  Patient had diarrhea and fever was sent for further evaluation.   Assessment & Plan:   Principal Problem:   Fever Active Problems:   DNR (do not resuscitate)   Essential hypertension   Pulmonary emboli (HCC)   Seizure (HCC)   Alzheimer disease (HCC)   Sepsis (HCC)   Hypernatremia  1-Fever, HO of Diarrhea;  -Facility with Norovirus outbreak.  -GI pathogen Pending  and C diff negative -Urinalysis negative , urine culture P -Respiratory panel negative -Influenza A, B and RSV negative -Chest x-ray: No active disease  Hypernatremia, dehydration: -In the setting of poor oral intake dehydration -Continue with IV fluids D5   Sepsis: -Presents with fever temperature 101, respiration rate 27, tachycardia -No source of infection identified at this point but there is possible norovirus outbreak at his facility -Continue with IV fluids and IV antibiotics -Chest X-ray no active disease -GI pathogen pending , C diff negative  Will repeat chest x ray tomorrow to ruled out infection.  Stop Vancomycin and Flagyl.  Continue with ceftriaxone.   Alzheimer  disease: -Resume Aricept.   Seizure disorder -resume Vimpat.   History of PE -Resume Eliquis.   1-2 Blood culture Positive for staph Hominis, likely contaminate.   Essential hypertension -PRN Hydralazine.  -not on meds prior to admission.    Aspiration; PNA Came back to see patient he develops worsening cough after drinking water and Vomiting.   -Albuterol given  -Place on Oxygen -check chest x ray.    Estimated body mass index is 20.25 kg/m as calculated from the following:   Height as of 10/06/22: 5\' 8"  (1.727 m).   Weight as of 10/06/22: 60.4 kg.   DVT prophylaxis: heparin  Code Status: Full code Family Communication: Care  discussed with Wife who was at bedside. Called wife spoke over phone, she agrees with DNR, No ICU transfer to central line, if things get worse make sure he is comfortable. Plan to treat Infection, continue with Fluids, antibiotics Disposition Plan:  Status is: Inpatient Remains inpatient appropriate because: Management of dehydration.     Consultants:  None  Procedures:  None  Antimicrobials:    Subjective: He is more alert, saying few more words.  Denies pain,.  Family at bedside.   Objective: Vitals:   03/19/23 1500 03/19/23 2026 03/20/23 0545 03/20/23 1351  BP: 129/72 113/65 125/69 (!) 124/96  Pulse: 61 63 61 69  Resp: 16 18 18 18   Temp:  98.6 F (37 C) 98.4 F (36.9 C) 98.4 F (36.9 C)  TempSrc:  Oral Oral Oral  SpO2: 97% 97% 98% 98%    Intake/Output Summary (Last 24 hours) at 03/20/2023 1452 Last data filed at 03/20/2023 0631 Gross per 24 hour  Intake 3043.84 ml  Output --  Net 3043.84 ml   There were no vitals filed for this visit.  Examination:  General exam: NAD  Respiratory system: CTA Cardiovascular system: S 1, S 2 RRR Gastrointestinal system: BS present, soft, nt Central nervous system: alert Extremities: No edema     Data  Reviewed: I have personally reviewed following labs and imaging studies  CBC: Recent Labs  Lab 03/18/23 1508 03/19/23 0548 03/20/23 1403  WBC 10.1 10.6* 9.6  NEUTROABS 7.6  --   --   HGB 15.6 13.7 13.6  HCT 47.9 41.8 39.9  MCV 88.7 87.8 86.6  PLT 269 243 233   Basic Metabolic Panel: Recent Labs  Lab 03/18/23 1508 03/19/23 0544  NA 150* 145  K 4.2 4.1  CL 108 113*  CO2 24 21*  GLUCOSE 108* 183*  BUN 28* 23   CREATININE 1.08 0.97  CALCIUM 9.5 8.3*  MG  --  2.0  PHOS  --  3.5   GFR: CrCl cannot be calculated (Unknown ideal weight.). Liver Function Tests: Recent Labs  Lab 03/18/23 1508 03/19/23 0544  AST 87* 65*  ALT 36 32  ALKPHOS 60 53  BILITOT 0.8 1.0  PROT 7.1 5.9*  ALBUMIN 3.6 3.1*   No results for input(s): "LIPASE", "AMYLASE" in the last 168 hours. No results for input(s): "AMMONIA" in the last 168 hours. Coagulation Profile: Recent Labs  Lab 03/18/23 1508 03/19/23 0318  INR 1.5* 1.4*   Cardiac Enzymes: Recent Labs  Lab 03/18/23 1508 03/19/23 1351  CKTOTAL 1,433* 710*   BNP (last 3 results) No results for input(s): "PROBNP" in the last 8760 hours. HbA1C: No results for input(s): "HGBA1C" in the last 72 hours. CBG: Recent Labs  Lab 03/18/23 1952  GLUCAP 145*   Lipid Profile: No results for input(s): "CHOL", "HDL", "LDLCALC", "TRIG", "CHOLHDL", "LDLDIRECT" in the last 72 hours. Thyroid Function Tests: No results for input(s): "TSH", "T4TOTAL", "FREET4", "T3FREE", "THYROIDAB" in the last 72 hours. Anemia Panel: No results for input(s): "VITAMINB12", "FOLATE", "FERRITIN", "TIBC", "IRON", "RETICCTPCT" in the last 72 hours. Sepsis Labs: Recent Labs  Lab 03/18/23 1515  LATICACIDVEN 1.6    Recent Results (from the past 240 hours)  Blood Culture (routine x 2)     Status: Abnormal   Collection Time: 03/18/23  3:08 PM   Specimen: BLOOD LEFT WRIST  Result Value Ref Range Status   Specimen Description BLOOD LEFT WRIST  Final   Special Requests   Final    BOTTLES DRAWN AEROBIC AND ANAEROBIC Blood Culture adequate volume   Culture  Setup Time   Final    GRAM POSITIVE COCCI IN CLUSTERS AEROBIC BOTTLE ONLY Organism ID to follow    Culture (A)  Final    STAPHYLOCOCCUS HOMINIS THE SIGNIFICANCE OF ISOLATING THIS ORGANISM FROM A SINGLE SET OF BLOOD CULTURES WHEN MULTIPLE SETS ARE DRAWN IS UNCERTAIN. PLEASE NOTIFY THE MICROBIOLOGY DEPARTMENT WITHIN ONE WEEK IF  SPECIATION AND SENSITIVITIES ARE REQUIRED. Performed at [facility], [address] [place], [place] [address]    Report Status 03/20/2023 FINAL  Final  Blood Culture ID Panel (Reflexed)     Status: Abnormal   Collection Time: 03/18/23  3:08 PM  Result Value Ref Range Status   Enterococcus faecalis NOT DETECTED NOT DETECTED Final   Enterococcus Faecium NOT DETECTED NOT DETECTED Final   Listeria monocytogenes NOT DETECTED NOT DETECTED Final   Staphylococcus species DETECTED (A) NOT DETECTED Final    Comment: CRITICAL RESULT CALLED TO, READ BACK BY AND VERIFIED WITH: PHARMD L. Imogene Burn [id number] AT 1858, ADC    Staphylococcus aureus (BCID) NOT DETECTED NOT DETECTED Final   Staphylococcus epidermidis NOT DETECTED NOT DETECTED Final   Staphylococcus lugdunensis NOT DETECTED NOT DETECTED Final   Streptococcus species NOT DETECTED NOT DETECTED Final   Streptococcus  agalactiae NOT DETECTED NOT DETECTED Final   Streptococcus pneumoniae NOT DETECTED NOT DETECTED Final   Streptococcus pyogenes NOT DETECTED NOT DETECTED Final   A.calcoaceticus-baumannii NOT DETECTED NOT DETECTED Final   Bacteroides fragilis NOT DETECTED NOT DETECTED Final   Enterobacterales NOT DETECTED NOT DETECTED Final   Enterobacter cloacae complex NOT DETECTED NOT DETECTED Final   Escherichia coli NOT DETECTED NOT DETECTED Final   Klebsiella aerogenes NOT DETECTED NOT DETECTED Final   Klebsiella oxytoca NOT DETECTED NOT DETECTED Final   Klebsiella pneumoniae NOT DETECTED NOT DETECTED Final   Proteus species NOT DETECTED NOT DETECTED Final   Salmonella species NOT DETECTED NOT DETECTED Final   Serratia marcescens NOT DETECTED NOT DETECTED Final   Haemophilus influenzae NOT DETECTED NOT DETECTED Final   Neisseria meningitidis NOT DETECTED NOT DETECTED Final   Pseudomonas aeruginosa NOT DETECTED NOT DETECTED Final   Stenotrophomonas maltophilia NOT DETECTED NOT DETECTED Final   Candida albicans NOT DETECTED NOT DETECTED  Final   Candida auris NOT DETECTED NOT DETECTED Final   Candida glabrata NOT DETECTED NOT DETECTED Final   Candida krusei NOT DETECTED NOT DETECTED Final   Candida parapsilosis NOT DETECTED NOT DETECTED Final   Candida tropicalis NOT DETECTED NOT DETECTED Final   Cryptococcus neoformans/gattii NOT DETECTED NOT DETECTED Final    Comment: Performed at [facility], [address], [place], [place] [address]  Resp panel by RT-PCR (RSV, Flu A&B, Covid) Anterior Nasal Swab     Status: None   Collection Time: 03/18/23  3:11 PM   Specimen: Anterior Nasal Swab  Result Value Ref Range Status   SARS Coronavirus 2 by RT PCR NEGATIVE NEGATIVE Final   Influenza A by PCR NEGATIVE NEGATIVE Final   Influenza B by PCR NEGATIVE NEGATIVE Final    Comment: (NOTE) The Xpert Xpress SARS-CoV-2/FLU/RSV plus assay is intended as an aid in the diagnosis of influenza from Nasopharyngeal swab specimens and should not be used as a sole basis for treatment. Nasal washings and aspirates are unacceptable for Xpert Xpress SARS-CoV-2/FLU/RSV testing.  Fact Sheet for Patients: BloggerCourse.com  Fact Sheet for Healthcare Providers: SeriousBroker.it  This test is not yet approved or cleared by the [place] FDA and has been authorized for detection and/or diagnosis of SARS-CoV-2 by FDA under an Emergency Use Authorization (EUA). This EUA will remain in effect (meaning this test can be used) for the duration of the COVID-19 declaration under Section 564(b)(1) of the Act, 21 U.S.C. section 360bbb-3(b)(1), unless the authorization is terminated or revoked.     Resp Syncytial Virus by PCR NEGATIVE NEGATIVE Final    Comment: (NOTE) Fact Sheet for Patients: BloggerCourse.com  Fact Sheet for Healthcare Providers: SeriousBroker.it  This test is not yet approved or cleared by the [place] FDA and has  been authorized for detection and/or diagnosis of SARS-CoV-2 by FDA under an Emergency Use Authorization (EUA). This EUA will remain in effect (meaning this test can be used) for the duration of the COVID-19 declaration under Section 564(b)(1) of the Act, 21 U.S.C. section 360bbb-3(b)(1), unless the authorization is terminated or revoked.  Performed at [facility], [address] [place], [place] [address]   Blood Culture (routine x 2)     Status: None (Preliminary result)   Collection Time: 03/18/23  3:15 PM   Specimen: BLOOD RIGHT FOREARM  Result Value Ref Range Status   Specimen Description BLOOD RIGHT FOREARM  Final   Special Requests   Final  BOTTLES DRAWN AEROBIC AND ANAEROBIC Blood Culture adequate volume   Culture   Final    NO GROWTH 2 DAYS Performed at [facility], [address] [place], [place] [address]    Report Status PENDING  Incomplete  Respiratory (~20 pathogens) panel by PCR     Status: None   Collection Time: 03/18/23  8:38 PM   Specimen: Nasopharyngeal Swab; Respiratory  Result Value Ref Range Status   Adenovirus NOT DETECTED NOT DETECTED Final   Coronavirus 229E NOT DETECTED NOT DETECTED Final    Comment: (NOTE) The Coronavirus on the Respiratory Panel, DOES NOT test for the novel  Coronavirus (2019 nCoV)    Coronavirus HKU1 NOT DETECTED NOT DETECTED Final   Coronavirus NL63 NOT DETECTED NOT DETECTED Final   Coronavirus OC43 NOT DETECTED NOT DETECTED Final   Metapneumovirus NOT DETECTED NOT DETECTED Final   Rhinovirus / Enterovirus NOT DETECTED NOT DETECTED Final   Influenza A NOT DETECTED NOT DETECTED Final   Influenza B NOT DETECTED NOT DETECTED Final   Parainfluenza Virus 1 NOT DETECTED NOT DETECTED Final   Parainfluenza Virus 2 NOT DETECTED NOT DETECTED Final   Parainfluenza Virus 3 NOT DETECTED NOT DETECTED Final   Parainfluenza Virus 4 NOT DETECTED NOT DETECTED Final   Respiratory Syncytial Virus NOT DETECTED NOT DETECTED Final    Bordetella pertussis NOT DETECTED NOT DETECTED Final   Bordetella Parapertussis NOT DETECTED NOT DETECTED Final   Chlamydophila pneumoniae NOT DETECTED NOT DETECTED Final   Mycoplasma pneumoniae NOT DETECTED NOT DETECTED Final    Comment: Performed at [facility], [address] [place], [place] [address]  C Difficile Quick Screen w PCR reflex     Status: None   Collection Time: 03/19/23  8:47 AM   Specimen: STOOL  Result Value Ref Range Status   C Diff antigen NEGATIVE NEGATIVE Final   C Diff toxin NEGATIVE NEGATIVE Final   C Diff interpretation No C. difficile detected.  Final    Comment: Performed at [facility], [address], [place], [place] [address]         Radiology Studies: CT HEAD WO CONTRAST ( ) Result Date: 03/18/2023 CLINICAL DATA:  Mental status change, unknown cause EXAM: CT HEAD WITHOUT CONTRAST TECHNIQUE: Contiguous axial images were obtained from the base of the skull through the vertex without intravenous contrast. RADIATION DOSE REDUCTION: This exam was performed according to the departmental dose-optimization program which includes automated exposure control, adjustment of the mA and/or kV according to patient size and/or use of iterative reconstruction technique. COMPARISON:  CT head 10/06/2022 FINDINGS: Brain: No intracranial hemorrhage, mass effect, or evidence of acute infarct. No hydrocephalus. No extra-axial fluid collection. Age advanced cerebral atrophy. Chronic small vessel ischemic disease. Vascular: No hyperdense vessel. Intracranial arterial calcification. Skull: No fracture or focal lesion. Sinuses/Orbits: No acute finding. Other: None. IMPRESSION: 1. No acute intracranial abnormality. 2. Age advanced cerebral atrophy. Electronically Signed   By: Minerva Fester M.D.   On: 03/18/2023 21:19   DG Chest Port 1 View Result Date: 03/18/2023 CLINICAL DATA:  Sepsis. EXAM: PORTABLE CHEST 1 VIEW COMPARISON:  09/15/2021 FINDINGS: The heart size and  mediastinal contours are within normal limits. Both lungs are clear. The visualized skeletal structures are unremarkable. IMPRESSION: No active disease. Electronically Signed   By: Danae Orleans M.D.   On: 03/18/2023 18:07        Scheduled Meds:  apixaban  5 mg Oral BID   donepezil  10  mg Oral QHS   lacosamide  50 mg Oral BID   pantoprazole (PROTONIX) IV  40 mg Intravenous Q12H   sertraline  50 mg Oral Daily   thiamine (VITAMIN B1) injection  100 mg Intravenous Daily   Continuous Infusions:  cefTRIAXone (ROCEPHIN)  IV 2 g (03/20/23 1113)   dextrose 75 mL/hr at 03/20/23 1354   heparin 950 Units/hr (03/20/23 0631)   metronidazole 100 mL/hr at 03/20/23 0631   vancomycin Stopped (03/19/23 2026)     LOS: 2 days    Time spent: 35 minutes    Vaudie Engebretsen A Lindsey Hommel, MD [facility]   If 7PM-7AM, please contact night-coverage [email or web address]  03/20/2023, 2:52 PM

## 2023-03-20 NOTE — Plan of Care (Signed)
  Problem: Fluid Volume: Goal: Hemodynamic stability will improve Outcome: Progressing   Problem: Clinical Measurements: Goal: Diagnostic test results will improve Outcome: Progressing Goal: Signs and symptoms of infection will decrease Outcome: Progressing   Problem: Respiratory: Goal: Ability to maintain adequate ventilation will improve Outcome: Progressing   Problem: Education: Goal: Knowledge of General Education information will improve Description: Including pain rating scale, medication(s)/side effects and non-pharmacologic comfort measures Outcome: Progressing   Problem: Health Behavior/Discharge Planning: Goal: Ability to manage health-related needs will improve Outcome: Progressing   Problem: Clinical Measurements: Goal: Ability to maintain clinical measurements within normal limits will improve Outcome: Progressing Goal: Will remain free from infection Outcome: Progressing Goal: Diagnostic test results will improve Outcome: Progressing Goal: Respiratory complications will improve Outcome: Progressing Goal: Cardiovascular complication will be avoided Outcome: Progressing   Problem: Elimination: Goal: Will not experience complications related to bowel motility Outcome: Progressing Goal: Will not experience complications related to urinary retention Outcome: Progressing   Problem: Pain Managment: Goal: General experience of comfort will improve and/or be controlled Outcome: Progressing   Problem: Safety: Goal: Ability to remain free from injury will improve Outcome: Progressing   Problem: Skin Integrity: Goal: Risk for impaired skin integrity will decrease Outcome: Progressing

## 2023-03-20 NOTE — Progress Notes (Signed)
   03/20/23 2009  Assess: MEWS Score  Temp (!) 102.5 F (39.2 C) (nofied rn)  BP  (Not able to get an accurate BP, will recheck)  Pulse Rate 100  Resp 18  Level of Consciousness Alert  SpO2 93 %  O2 Device Nasal Cannula  O2 Flow Rate (L/min) 3 L/min  Assess: MEWS Score  MEWS Temp 2  MEWS Systolic 0  MEWS Pulse 0  MEWS RR 0  MEWS LOC 0  MEWS Score 2  MEWS Score Color Yellow  Assess: if the MEWS score is Yellow or Red  Were vital signs accurate and taken at a resting state? Yes  Does the patient meet 2 or more of the SIRS criteria? Yes  Does the patient have a confirmed or suspected source of infection? No  MEWS guidelines implemented  Yes, yellow  Treat  MEWS Interventions Considered administering scheduled or prn medications/treatments as ordered  Take Vital Signs  Increase Vital Sign Frequency  Yellow: Q2hr x1, continue Q4hrs until patient remains green for 12hrs  Escalate  MEWS: Escalate Yellow: Discuss with charge nurse and consider notifying provider and/or RRT  Notify: Charge Nurse/RN  Name of Charge Nurse/RN Notified Mahima, RN  Provider Notification  Provider Name/Title A. Virgel Manifold, NP  Date Provider Notified 03/20/23  Time Provider Notified 1945  Method of Notification Page  Notification Reason Other (Comment)  Provider response See new orders  Date of Provider Response 03/20/23  Time of Provider Response 2000  Assess: SIRS CRITERIA  SIRS Temperature  1  SIRS Respirations  0  SIRS Pulse 1  SIRS WBC 0  SIRS Score Sum  2

## 2023-03-20 NOTE — Progress Notes (Signed)
 Pts family came to get nurse as pt was choking and stated he had vomited.  Upon entering, nurse noticed pt was coughing and sounded wheezy.  Suctioned and sat pt upright to encourage coughing.  Notified MD.  Orders placed and executed.  Neb treatment given. Oxygen 99% Room air.  Nasal Cannula placed for comfort.  Ativan administered per orders.  Pt now sitting comfortably with wife at [facility].

## 2023-03-21 ENCOUNTER — Inpatient Hospital Stay (HOSPITAL_COMMUNITY)

## 2023-03-21 DIAGNOSIS — E870 Hyperosmolality and hypernatremia: Secondary | ICD-10-CM | POA: Diagnosis not present

## 2023-03-21 LAB — GASTROINTESTINAL PANEL BY PCR, STOOL (REPLACES STOOL CULTURE)
Adenovirus F40/41: NOT DETECTED
Astrovirus: NOT DETECTED
Campylobacter species: DETECTED — AB
Cryptosporidium: NOT DETECTED
Cyclospora cayetanensis: NOT DETECTED
Entamoeba histolytica: NOT DETECTED
Enteroaggregative E coli (EAEC): NOT DETECTED
Enteropathogenic E coli (EPEC): NOT DETECTED
Enterotoxigenic E coli (ETEC): NOT DETECTED
Giardia lamblia: NOT DETECTED
Norovirus GI/GII: DETECTED — AB
Plesimonas shigelloides: NOT DETECTED
Rotavirus A: NOT DETECTED
Salmonella species: NOT DETECTED
Sapovirus (I, II, IV, and V): NOT DETECTED
Shiga like toxin producing E coli (STEC): NOT DETECTED
Shigella/Enteroinvasive E coli (EIEC): NOT DETECTED
Vibrio cholerae: NOT DETECTED
Vibrio species: NOT DETECTED
Yersinia enterocolitica: NOT DETECTED

## 2023-03-21 LAB — URINE CULTURE: Culture: NO GROWTH

## 2023-03-21 MED ORDER — DEXTROSE 5 % IV SOLN: INTRAVENOUS | Status: AC

## 2023-03-21 MED ORDER — SODIUM CHLORIDE 0.9 % IV SOLN
500.0000 mg | INTRAVENOUS | Status: AC
  Administered 2023-03-21 – 2023-03-24 (×4): 500 mg via INTRAVENOUS
  Filled 2023-03-21 (×4): qty 5

## 2023-03-21 NOTE — Progress Notes (Signed)
 SLP Cancellation Note  Patient Details Name: Jonathan Berry MRN: [id number] DOB: [birth year]   Cancelled treatment:        Pt attempted to be seen this afternoon, pt not in room due to being with radiology. SLP to f/u when returned to his room as time permits.  Dione Housekeeper M.S. CCC-SLP

## 2023-03-21 NOTE — Evaluation (Signed)
 Clinical/Bedside Swallow Evaluation Patient Details  Name: Jonathan Berry MRN: [id number] Date of Birth: 01/20/1945  Today's Date: 03/21/2023 Time: SLP Start Time (ACUTE ONLY): 1339 SLP Stop Time (ACUTE ONLY): 1408 SLP Time Calculation (min) (ACUTE ONLY): 29 min  Past Medical History:  Past Medical History:  Diagnosis Date   Alzheimer disease (HCC)    COVID    Hypercholesteremia    Hypertension    Seizures (HCC)    Past Surgical History:  Past Surgical History:  Procedure Laterality Date   KNEE SURGERY     MANDIBLE SURGERY     HPI:  78yo male admitted from [facility] memory care 03/18/23 with fever, decreased PO intake. PMH: seizures, HLD, dementia - nonverbal, PE. CXR negative for PNA. Pt had an event on 3/7 where he threw up his food and aspirated his vomit.    Assessment / Plan / Recommendation  Clinical Impression  Pt seen for skilled ST services for swallow evaluation to determine PO readiness. The pt was made NPO following emesis episode and observed coughing with thins. The pt was assessed with ice chips, thin liquid, and thickened liquid. The pt was minimally verbal but the pt's daughter was present to assist with communication. The pt's OME was limited due to direction following abilities. Pt appears to have natural dentition. The pt was given ice chips, thin liquid, and nectar thick liquid with assist from the SLP for feeding with s/sx of aspiration for all consistencies. The pt had poor bolus awareness, audible and multiple swallows, and intermittent wet vocal quality, immediate throat clearances, and delayed coughing. The pt also had some gurgling, per his wife reports via telephone, there is not a h/x of reflux. The pt presents as a major aspiration risk given the s/sx observed and current mentation and fatigue. Safest diet rec remains NPO with comfort ice chips following oral care PRN. SLP to f/u with MBS when medically appropriate. SLP Visit Diagnosis: Dysphagia, unspecified  (R13.10)    Aspiration Risk  Severe aspiration risk;Risk for inadequate nutrition/hydration;Moderate aspiration risk    Diet Recommendation NPO    Medication Administration: Via alternative means    Other  Recommendations Recommended Consults: Consider esophageal assessment;Other (Comment) (Pending MBS results) Oral Care Recommendations: Oral care BID;Staff/trained caregiver to provide oral care    Recommendations for follow up therapy are one component of a multi-disciplinary discharge planning process, led by the attending physician.  Recommendations may be updated based on patient status, additional functional criteria and insurance authorization.  Follow up Recommendations        Assistance Recommended at Discharge    Functional Status Assessment    Frequency and Duration min 2x/week  1 week;2 weeks       Prognosis Prognosis for improved oropharyngeal function: Guarded Barriers to Reach Goals: Cognitive deficits      Swallow Study   General Date of Onset: 03/18/23 HPI: 78yo male admitted from [facility] memory care 03/18/23 with fever, decreased PO intake. PMH: seizures, HLD, dementia - nonverbal, PE. CXR negative for PNA. Pt had an event on 3/7 where he threw up his food and aspirated his vomit. Type of Study: Bedside Swallow Evaluation Previous Swallow Assessment: BSE 03/19/23: dys 2/thin Diet Prior to this Study: NPO Temperature Spikes Noted: No Respiratory Status: Nasal cannula History of Recent Intubation: No Behavior/Cognition: Lethargic/Drowsy;Pleasant mood;Doesn't follow directions Oral Cavity Assessment: Other (comment) (Unable to assess,) Oral Care Completed by SLP: No Oral Cavity - Dentition: Other (Comment) (unable to visualize, seemingly natural dentition) Vision: Functional  for self-feeding Self-Feeding Abilities: Needs assist Patient Positioning: Upright in bed Baseline Vocal Quality: Normal Volitional Cough: Cognitively unable to elicit Volitional  Swallow: Unable to elicit    Oral/Motor/Sensory Function     Ice Chips Ice chips: Impaired Presentation: Spoon Oral Phase Impairments: Reduced labial seal;Poor awareness of bolus Pharyngeal Phase Impairments: Multiple swallows;Throat Clearing - Delayed   Thin Liquid Thin Liquid: Impaired Presentation: Spoon Oral Phase Impairments: Poor awareness of bolus Oral Phase Functional Implications: Oral holding Pharyngeal  Phase Impairments: Other (comments);Multiple swallows;Cough - Delayed;Throat Clearing - Immediate;Throat Clearing - Delayed;Wet Vocal Quality (audible swallow)    Nectar Thick Nectar Thick Liquid: Impaired Presentation: Spoon Oral Phase Impairments: Poor awareness of bolus Pharyngeal Phase Impairments: Multiple swallows;Wet Vocal Quality;Throat Clearing - Delayed   Honey Thick Honey Thick Liquid: Not tested   Puree Puree: Not tested   Solid     Solid: Not tested      Dione Housekeeper M.S. CCC-SLP

## 2023-03-21 NOTE — Progress Notes (Signed)
 Floated to unit at 14:00. Report received from day shift nurse. Assumed care for this patient at this time.

## 2023-03-21 NOTE — Plan of Care (Signed)
   Problem: Fluid Volume: Goal: Hemodynamic stability will improve Outcome: Progressing   Problem: Clinical Measurements: Goal: Diagnostic test results will improve Outcome: Progressing Goal: Signs and symptoms of infection will decrease Outcome: Progressing   Problem: Respiratory: Goal: Ability to maintain adequate ventilation will improve Outcome: Progressing

## 2023-03-21 NOTE — Progress Notes (Signed)
 PROGRESS NOTE    Jonathan Berry  ZOX:[id number] DOB: [birth year] DOA: 03/18/2023 PCP: Collene Mares, [place]   Brief Narrative: 78 year old with past medical history significant for seizure, dementia, pulmonary embolism and PE December 2022 presented via EMS from memory care facility due to fever, poor oral intake, patient is nonverbal at baseline.  Admission was requested for sepsis.  Per wife patient is nonverbal at baseline, say occasional words.  Norovirus outbreak at his facility.  Patient had diarrhea and fever was sent for further evaluation.   Assessment & Plan:   Principal Problem:   Fever Active Problems:   DNR (do not resuscitate)   Essential hypertension   Pulmonary emboli (HCC)   Seizure (HCC)   Alzheimer disease (HCC)   Sepsis (HCC)   Hypernatremia  1-gastroenteritis secondary to Campylobacter and norovirus Fever, HO of Diarrhea;  -Facility with Norovirus outbreak.  -GI pathogen: Campylobacter and norovirus and C diff negative -Urinalysis negative , urine culture P -Respiratory panel negative -Influenza A, B and RSV negative -Chest x-ray: No active disease He vomited multiple times yesterday, KUB obtained negative for obstruction Continue with supportive care Due To vomiting he was made n.p.o., awaiting speech evaluation this afternoon Started azithromycin for Campylobacter  Hypernatremia, dehydration: -In the setting of poor oral intake dehydration -Continue with IV fluids D5   Sepsis: In the setting of gastroenteritis, norovirus and Campylobacter -Presents with fever temperature 101, respiration rate 27, tachycardia -No source of infection identified at this point but there is possible norovirus outbreak at his facility -Continue with IV fluids and IV antibiotics -Chest X-ray no active disease -GI pathogen pending , C diff negative  -X-ray bilateral bases atelectasis Stop Vancomycin and Flagyl.  Continue with ceftriaxone.   Alzheimer  disease: -Resume  Aricept.   Seizure disorder -resume Vimpat.   History of PE -Resume Eliquis.   1-2 Blood culture Positive for staph Hominis, likely contaminate.   Essential hypertension -PRN Hydralazine.  -not on meds prior to admission.    Aspiration; PNA Came back to see patient he develops worsening cough after drinking water and Vomiting.  -Albuterol given  -Place on Oxygen -X-ray bilateral atelectasis -Continue with IV ceftriaxone   Estimated body mass index is 22.75 kg/m as calculated from the following:   Height as of this encounter: 5\' 7"  (1.702 m).   Weight as of this encounter: 65.9 kg.   DVT prophylaxis: heparin  Code Status: Full code Family Communication: Care  discussed with Wife who was at bedside. Called wife spoke over phone, she agrees with DNR, No ICU transfer to central line, if things get worse make sure he is comfortable. Plan to treat Infection, continue with Fluids, antibiotics Disposition Plan:  Status is: Inpatient Remains inpatient appropriate because: Management of dehydration.     Consultants:  None  Procedures:  None  Antimicrobials:    Subjective: He is alert this morning, no further vomiting since yesterday.  Objective: Vitals:   03/20/23 2221 03/20/23 2336 03/21/23 0401 03/21/23 0759  BP: (!) 197/98 (!) 124/92 117/66 107/61  Pulse: 76 75 68 69  Resp: 18 20 18 18   Temp: 99.3 F (37.4 C) 98.9 F (37.2 C) 98.5 F (36.9 C) 99 F (37.2 C)  TempSrc: Oral Oral Oral Axillary  SpO2: 98% 98% 100% 100%  Weight:      Height:        Intake/Output Summary (Last 24 hours) at 03/21/2023 1323 Last data filed at 03/21/2023 1200 Gross per 24 hour  Intake 2583.35 ml  Output --  Net 2583.35 ml   Filed Weights   03/20/23 2038  Weight: 65.9 kg    Examination:  General exam: NAD Respiratory system: CTA Cardiovascular system: S  1, S 2 RRR Gastrointestinal system: BS present, soft, nt Central nervous system: Alert Extremities: No  edema     Data Reviewed: I have personally reviewed following labs and imaging studies  CBC: Recent Labs  Lab 03/18/23 1508 03/19/23 0548 03/20/23 1403  WBC 10.1 10.6* 9.6  NEUTROABS 7.6  --   --   HGB 15.6 13.7 13.6  HCT 47.9 41.8 39.9  MCV 88.7 87.8 86.6  PLT 269 243 233   Basic Metabolic Panel: Recent Labs  Lab 03/18/23 1508 03/19/23 0544 03/20/23 1403  NA 150* 145 140  K 4.2 4.1 4.3  CL 108 113* 104  CO2 24 21* 26  GLUCOSE 108* 183* 108*  BUN 28* 23 12  CREATININE 1.08 0.97 0.83  CALCIUM 9.5 8.3* 8.6*  MG  --  2.0 1.9  PHOS  --  3.5  --    GFR: Estimated Creatinine Clearance: 69.5 mL/min (by C-G formula based on SCr of 0.83 mg/dL). Liver Function Tests: Recent Labs  Lab 03/18/23 1508 03/19/23 0544  AST 87* 65*  ALT 36 32  ALKPHOS 60 53  BILITOT 0.8 1.0  PROT 7.1 5.9*  ALBUMIN 3.6 3.1*   No results for input(s): "LIPASE", "AMYLASE" in the last 168 hours. No results for input(s): "AMMONIA" in the last 168 hours. Coagulation Profile: Recent Labs  Lab 03/18/23 1508 03/19/23 0318  INR 1.5* 1.4*   Cardiac Enzymes: Recent Labs  Lab 03/18/23 1508 03/19/23 1351  CKTOTAL 1,433* 710*   BNP (last 3 results) No results for input(s): "PROBNP" in the last 8760 hours. HbA1C: No results for input(s): "HGBA1C" in the last 72 hours. CBG: Recent Labs  Lab 03/18/23 1952 03/20/23 1826  GLUCAP 145* 97   Lipid Profile: No results for input(s): "CHOL", "HDL", "LDLCALC", "TRIG", "CHOLHDL", "LDLDIRECT" in the last 72 hours. Thyroid Function Tests: No results for input(s): "TSH", "T4TOTAL", "FREET4", "T3FREE", "THYROIDAB" in the last 72 hours. Anemia Panel: No results for input(s): "VITAMINB12", "FOLATE", "FERRITIN", "TIBC", "IRON", "RETICCTPCT" in the last 72 hours. Sepsis Labs: Recent Labs  Lab 03/18/23 1515  LATICACIDVEN 1.6    Recent Results (from the past 240 hours)  Blood Culture (routine x 2)     Status: Abnormal   Collection Time: 03/18/23   3:08 PM   Specimen: BLOOD LEFT WRIST  Result Value Ref Range Status   Specimen Description BLOOD LEFT WRIST  Final   Special Requests   Final    BOTTLES DRAWN AEROBIC AND ANAEROBIC Blood Culture adequate volume   Culture  Setup Time   Final    GRAM POSITIVE COCCI IN CLUSTERS AEROBIC BOTTLE ONLY Organism ID to follow    Culture (A)  Final    STAPHYLOCOCCUS HOMINIS THE SIGNIFICANCE OF ISOLATING THIS ORGANISM FROM A SINGLE SET OF BLOOD CULTURES WHEN MULTIPLE SETS ARE DRAWN IS UNCERTAIN. PLEASE NOTIFY THE MICROBIOLOGY DEPARTMENT WITHIN ONE WEEK IF SPECIATION AND SENSITIVITIES ARE REQUIRED. Performed at [facility], [address], [place], [place] [address]    Report Status 03/20/2023 FINAL  Final  Blood Culture ID Panel (Reflexed)     Status: Abnormal   Collection Time: 03/18/23  3:08 PM  Result Value Ref Range Status   Enterococcus faecalis NOT DETECTED NOT DETECTED Final   Enterococcus Faecium NOT DETECTED NOT DETECTED Final  Listeria monocytogenes NOT DETECTED NOT DETECTED Final   Staphylococcus species DETECTED (A) NOT DETECTED Final    Comment: CRITICAL RESULT CALLED TO, READ BACK BY AND VERIFIED WITH: PHARMD L. Imogene Burn [id number] AT 1858, ADC    Staphylococcus aureus (BCID) NOT DETECTED NOT DETECTED Final   Staphylococcus epidermidis NOT DETECTED NOT DETECTED Final   Staphylococcus lugdunensis NOT DETECTED NOT DETECTED Final   Streptococcus species NOT DETECTED NOT DETECTED Final   Streptococcus agalactiae NOT DETECTED NOT DETECTED Final   Streptococcus pneumoniae NOT DETECTED NOT DETECTED Final   Streptococcus pyogenes NOT DETECTED NOT DETECTED Final   A.calcoaceticus-baumannii NOT DETECTED NOT DETECTED Final   Bacteroides fragilis NOT DETECTED NOT DETECTED Final   Enterobacterales NOT DETECTED NOT DETECTED Final   Enterobacter cloacae complex NOT DETECTED NOT DETECTED Final   Escherichia coli NOT DETECTED NOT DETECTED Final   Klebsiella aerogenes NOT DETECTED NOT DETECTED  Final   Klebsiella oxytoca NOT DETECTED NOT DETECTED Final   Klebsiella pneumoniae NOT DETECTED NOT DETECTED Final   Proteus species NOT DETECTED NOT DETECTED Final   Salmonella species NOT DETECTED NOT DETECTED Final   Serratia marcescens NOT DETECTED NOT DETECTED Final   Haemophilus influenzae NOT DETECTED NOT DETECTED Final   Neisseria meningitidis NOT DETECTED NOT DETECTED Final   Pseudomonas aeruginosa NOT DETECTED NOT DETECTED Final   Stenotrophomonas maltophilia NOT DETECTED NOT DETECTED Final   Candida albicans NOT DETECTED NOT DETECTED Final   Candida auris NOT DETECTED NOT DETECTED Final   Candida glabrata NOT DETECTED NOT DETECTED Final   Candida krusei NOT DETECTED NOT DETECTED Final   Candida parapsilosis NOT DETECTED NOT DETECTED Final   Candida tropicalis NOT DETECTED NOT DETECTED Final   Cryptococcus neoformans/gattii NOT DETECTED NOT DETECTED Final    Comment: Performed at [facility], [address], [place], [place] [address]  Resp panel by RT-PCR (RSV, Flu A&B, Covid) Anterior Nasal Swab     Status: None   Collection Time: 03/18/23  3:11 PM   Specimen: Anterior Nasal Swab  Result Value Ref Range Status   SARS Coronavirus 2 by RT PCR NEGATIVE NEGATIVE Final   Influenza A by PCR NEGATIVE NEGATIVE Final   Influenza B by PCR NEGATIVE NEGATIVE Final    Comment: (NOTE) The Xpert Xpress SARS-CoV-2/FLU/RSV plus assay is intended as an aid in the diagnosis of influenza from Nasopharyngeal swab specimens and should not be used as a sole basis for treatment. Nasal washings and aspirates are unacceptable for Xpert Xpress SARS-CoV-2/FLU/RSV testing.  Fact Sheet for Patients: BloggerCourse.com  Fact Sheet for Healthcare Providers: SeriousBroker.it  This test is not yet approved or cleared by the [place] FDA and has been authorized for detection and/or diagnosis of SARS-CoV-2 by FDA under an Emergency Use  Authorization (EUA). This EUA will remain in effect (meaning this test can be used) for the duration of the COVID-19 declaration under Section 564(b)(1) of the Act, 21 U.S.C. section 360bbb-3(b)(1), unless the authorization is terminated or revoked.     Resp Syncytial Virus by PCR NEGATIVE NEGATIVE Final    Comment: (NOTE) Fact Sheet for Patients: BloggerCourse.com  Fact Sheet for Healthcare Providers: SeriousBroker.it  This test is not yet approved or cleared by the [place] FDA and has been authorized for detection and/or diagnosis of SARS-CoV-2 by FDA under an Emergency Use Authorization (EUA). This EUA will remain in effect (meaning this test can be used) for the duration of the COVID-19 declaration under Section 564(b)(1) of the Act, 21  U.S.C. section 360bbb-3(b)(1), unless the authorization is terminated or revoked.  Performed at [facility], [address] [place], [place] [address]   Blood Culture (routine x 2)     Status: None (Preliminary result)   Collection Time: 03/18/23  3:15 PM   Specimen: BLOOD RIGHT FOREARM  Result Value Ref Range Status   Specimen Description BLOOD RIGHT FOREARM  Final   Special Requests   Final    BOTTLES DRAWN AEROBIC AND ANAEROBIC Blood Culture adequate volume   Culture   Final    NO GROWTH 3 DAYS Performed at [facility], [address], [place], [place] [address]    Report Status PENDING  Incomplete  Gastrointestinal Panel by PCR , Stool     Status: Abnormal   Collection Time: 03/18/23  7:11 PM   Specimen: Stool  Result Value Ref Range Status   Campylobacter species DETECTED (A) NOT DETECTED Final    Comment: CRITICAL RESULT CALLED TO, READ BACK BY AND VERIFIED WITH: Kathryne Hitch RN [facility] @ [phone] 03/21/23 lfd    Plesimonas shigelloides NOT DETECTED NOT DETECTED Final   Salmonella species NOT DETECTED NOT DETECTED Final   Yersinia enterocolitica NOT DETECTED NOT  DETECTED Final   Vibrio species NOT DETECTED NOT DETECTED Final   Vibrio cholerae NOT DETECTED NOT DETECTED Final   Enteroaggregative E coli (EAEC) NOT DETECTED NOT DETECTED Final   Enteropathogenic E coli (EPEC) NOT DETECTED NOT DETECTED Final   Enterotoxigenic E coli (ETEC) NOT DETECTED NOT DETECTED Final   Shiga like toxin producing E coli (STEC) NOT DETECTED NOT DETECTED Final   Shigella/Enteroinvasive E coli (EIEC) NOT DETECTED NOT DETECTED Final   Cryptosporidium NOT DETECTED NOT DETECTED Final   Cyclospora cayetanensis NOT DETECTED NOT DETECTED Final   Entamoeba histolytica NOT DETECTED NOT DETECTED Final   Giardia lamblia NOT DETECTED NOT DETECTED Final   Adenovirus F40/41 NOT DETECTED NOT DETECTED Final   Astrovirus NOT DETECTED NOT DETECTED Final   Norovirus GI/GII DETECTED (A) NOT DETECTED Final    Comment: CRITICAL RESULT CALLED TO, READ BACK BY AND VERIFIED WITH: Kathryne Hitch RN [facility] @ [phone] 03/21/23 lfd    Rotavirus A NOT DETECTED NOT DETECTED Final   Sapovirus (I, II, IV, and V) NOT DETECTED NOT DETECTED Final    Comment: Performed at [facility], [address] [place], [place] [address]  Respiratory (~20 pathogens) panel by PCR     Status: None   Collection Time: 03/18/23  8:38 PM   Specimen: Nasopharyngeal Swab; Respiratory  Result Value Ref Range Status   Adenovirus NOT DETECTED NOT DETECTED Final   Coronavirus 229E NOT DETECTED NOT DETECTED Final    Comment: (NOTE) The Coronavirus on the Respiratory Panel, DOES NOT test for the novel  Coronavirus (2019 nCoV)    Coronavirus HKU1 NOT DETECTED NOT DETECTED Final   Coronavirus NL63 NOT DETECTED NOT DETECTED Final   Coronavirus OC43 NOT DETECTED NOT DETECTED Final   Metapneumovirus NOT DETECTED NOT DETECTED Final   Rhinovirus / Enterovirus NOT DETECTED NOT DETECTED Final   Influenza A NOT DETECTED NOT DETECTED Final   Influenza B NOT DETECTED NOT DETECTED Final   Parainfluenza Virus 1 NOT DETECTED NOT  DETECTED Final   Parainfluenza Virus 2 NOT DETECTED NOT DETECTED Final   Parainfluenza Virus 3 NOT DETECTED NOT DETECTED Final   Parainfluenza Virus 4 NOT DETECTED NOT DETECTED Final   Respiratory Syncytial Virus NOT DETECTED NOT DETECTED Final   Bordetella pertussis  NOT DETECTED NOT DETECTED Final   Bordetella Parapertussis NOT DETECTED NOT DETECTED Final   Chlamydophila pneumoniae NOT DETECTED NOT DETECTED Final   Mycoplasma pneumoniae NOT DETECTED NOT DETECTED Final    Comment: Performed at [facility], [address] [place], [place] [address]  C Difficile Quick Screen w PCR reflex     Status: None   Collection Time: 03/19/23  8:47 AM   Specimen: STOOL  Result Value Ref Range Status   C Diff antigen NEGATIVE NEGATIVE Final   C Diff toxin NEGATIVE NEGATIVE Final   C Diff interpretation No C. difficile detected.  Final    Comment: Performed at [facility], [address], [place], [place] [address]  Urine Culture (for pregnant, neutropenic or urologic patients or patients with an indwelling urinary catheter)     Status: None   Collection Time: 03/19/23  8:49 AM   Specimen: Urine, Clean Catch  Result Value Ref Range Status   Specimen Description URINE, CLEAN CATCH  Final   Special Requests NONE  Final   Culture   Final    NO GROWTH Performed at [facility], [address] [place], [place] [address]    Report Status 03/21/2023 FINAL  Final         Radiology Studies: DG Abd 1 View Result Date: 03/21/2023 CLINICAL DATA:  78 year old male with nausea and vomiting. EXAM: ABDOMEN - 1 VIEW COMPARISON:  Pelvis CT 09/15/2021.  Portable chest yesterday. FINDINGS: Supine AP view at 0906 hours. Extensive Calcified aortic atherosclerosis. Negative lung bases. Scoliosis. Nonobstructed bowel gas pattern. Not all of the left abdomen is visible on this single view. No acute osseous abnormality identified. IMPRESSION: Non obstructed visible bowel gas pattern. Aortic  Atherosclerosis (ICD10-I70.0). Electronically Signed   By: Odessa Fleming M.D.   On: 03/21/2023 12:52   DG CHEST PORT 1 VIEW Result Date: 03/20/2023 CLINICAL DATA:  Cough. EXAM: PORTABLE CHEST 1 VIEW COMPARISON:  03/18/2023 FINDINGS: The cardiomediastinal contours are normal. Mild bibasilar atelectasis. Pulmonary vasculature is normal. No pleural effusion or pneumothorax. No acute osseous abnormalities are seen. IMPRESSION: Mild bibasilar atelectasis.  No confluent airspace disease. Electronically Signed   By: Narda Rutherford M.D.   On: 03/20/2023 18:33        Scheduled Meds:  budesonide (PULMICORT) nebulizer solution  0.25 mg Nebulization BID   donepezil  10 mg Oral QHS   enoxaparin (LOVENOX) injection  60 mg Subcutaneous Q12H   pantoprazole (PROTONIX) IV  40 mg Intravenous Q12H   sertraline  50 mg Oral Daily   thiamine (VITAMIN B1) injection  100 mg Intravenous Daily   triamcinolone 0.1 % cream : eucerin  1 Application Topical BID   Continuous Infusions:  azithromycin 250 mL/hr at 03/21/23 1200   cefTRIAXone (ROCEPHIN)  IV Stopped (03/21/23 1106)   dextrose 75 mL/hr at 03/21/23 1029   lacosamide (VIMPAT) IV Stopped (03/21/23 1054)     LOS: 3 days    Time spent: 35 minutes    Tifanie Gardiner A Shallen Luedke, MD [facility]   If 7PM-7AM, please contact night-coverage [email or web address]  03/21/2023, 1:23 PM

## 2023-03-22 ENCOUNTER — Inpatient Hospital Stay (HOSPITAL_COMMUNITY)

## 2023-03-22 DIAGNOSIS — E870 Hyperosmolality and hypernatremia: Secondary | ICD-10-CM | POA: Diagnosis not present

## 2023-03-22 LAB — CBC
HCT: 32.1 % — ABNORMAL LOW (ref 39.0–52.0)
Hemoglobin: 11 g/dL — ABNORMAL LOW (ref 13.0–17.0)
MCH: 29.1 pg (ref 26.0–34.0)
MCHC: 34.3 g/dL (ref 30.0–36.0)
MCV: 84.9 fL (ref 80.0–100.0)
Platelets: 206 10*3/uL (ref 150–400)
RBC: 3.78 MIL/uL — ABNORMAL LOW (ref 4.22–5.81)
RDW: 12.5 % (ref 11.5–15.5)
WBC: 10.7 10*3/uL — ABNORMAL HIGH (ref 4.0–10.5)
nRBC: 0 % (ref 0.0–0.2)

## 2023-03-22 LAB — BASIC METABOLIC PANEL
Anion gap: 5 (ref 5–15)
BUN: 12 mg/dL (ref 8–23)
CO2: 25 mmol/L (ref 22–32)
Calcium: 8 mg/dL — ABNORMAL LOW (ref 8.9–10.3)
Chloride: 106 mmol/L (ref 98–111)
Creatinine, Ser: 0.82 mg/dL (ref 0.61–1.24)
GFR, Estimated: >60 mL/min (ref >60)
Glucose, Bld: 101 mg/dL — ABNORMAL HIGH (ref 70–99)
Potassium: 3.2 mmol/L — ABNORMAL LOW (ref 3.5–5.1)
Sodium: 136 mmol/L (ref 135–145)

## 2023-03-22 NOTE — Progress Notes (Signed)
 PROGRESS NOTE    Jonathan Berry  JXB:[id number] DOB: 05-Nov-1945 DOA: 03/18/2023 PCP: Collene Mares, [place]   Brief Narrative: 78 year old with past medical history significant for seizure, dementia, pulmonary embolism and PE December 2022 presented via EMS from memory care facility due to fever, poor oral intake, patient is nonverbal at baseline.  Admission was requested for sepsis.  Per wife patient is nonverbal at baseline, say occasional words.  Norovirus outbreak at his facility.  Patient had diarrhea and fever was sent for further evaluation.   Assessment & Plan:   Principal Problem:   Fever Active Problems:   DNR (do not resuscitate)   Essential hypertension   Pulmonary emboli (HCC)   Seizure (HCC)   Alzheimer disease (HCC)   Sepsis (HCC)   Hypernatremia  1-Gastroenteritis secondary to Campylobacter and norovirus Fever, HO of Diarrhea;  -Facility with Norovirus outbreak.  -GI pathogen: Campylobacter and norovirus and C diff negative -Urinalysis negative , urine culture P -Respiratory panel negative -Influenza A, B and RSV negative -Chest x-ray: No active disease He vomited multiple times yesterday, KUB obtained negative for obstruction Continue with supportive care Due To vomiting he was made n.p.o., awaiting speech evaluation this afternoon Started azithromycin for Campylobacter  Hypernatremia, dehydration: -In the setting of poor oral intake dehydration -Continue with IV fluids D5   Sepsis: In the setting of gastroenteritis, norovirus and Campylobacter -Presents with fever temperature 101, respiration rate 27, tachycardia -No source of infection identified at this point but there is possible norovirus outbreak at his facility -Continue with IV fluids and IV antibiotics -Chest X-ray no active disease -GI pathogen Campylobacter, Norovirus  , C diff negative  -X-ray bilateral bases atelectasis Stop Vancomycin and Flagyl.  Continue with ceftriaxone.   Alzheimer   disease: -Resume Aricept.   Dysphagia:  High risk for aspiration, immobility of epiglottis leading to excessive pharyngeal residuals of PO's . Speech discussed with family, plan is for Dysphagia 1 thin liquids.   Seizure disorder -resume Vimpat.   History of PE -Resume Eliquis.   1-2 Blood culture Positive for staph Hominis, likely contaminate.   Essential hypertension -PRN Hydralazine.  -not on meds prior to admission.    Aspiration; PNA Came back to see patient he develops worsening cough after drinking water and Vomiting.  -Albuterol given  -wean off oxygen.  -X-ray bilateral atelectasis -Continue with IV ceftriaxone   Estimated body mass index is 22.75 kg/m as calculated from the following:   Height as of this encounter: 5\' 7"  (1.702 m).   Weight as of this encounter: 65.9 kg.   DVT prophylaxis: heparin  Code Status: Full code Family Communication: Care  discussed with Wife who was at bedside. Called wife spoke over phone, she agrees with DNR, No ICU transfer, no central line, if things get worse make sure he is comfortable. Plan to treat Infection, continue with Fluids, antibiotics Disposition Plan:  Status is: Inpatient Remains inpatient appropriate because: Management of dehydration.     Consultants:  None  Procedures:  None  Antimicrobials:    Subjective: He is alert. Denies pain.  He would like to eat.  No further vomiting.   Objective: Vitals:   03/21/23 2015 03/22/23 0502 03/22/23 0915 03/22/23 1401  BP: (!) 94/57 108/66 (!) 113/55 (!) 100/55  Pulse: 68 (!) 59 61 (!) 54  Resp: 18 18 18 16   Temp: 99 F (37.2 C) 99.2 F (37.3 C) 98.3 F (36.8 C) 98.3 F (36.8 C)  TempSrc: Oral Oral  Oral  SpO2: 95% 94% 96% 90%  Weight:      Height:        Intake/Output Summary (Last 24 hours) at 03/22/2023 1526 Last data filed at 03/22/2023 0553 Gross per 24 hour  Intake 30 ml  Output --  Net 30 ml   Filed Weights   03/20/23 2038  Weight: 65.9 kg     Examination:  General exam: NAD Respiratory system:CTA Cardiovascular system: S 1, S 2 RRR Gastrointestinal system: BS present, soft, nt Central nervous system: alert Extremities: No edema     Data Reviewed: I have personally reviewed following labs and imaging studies  CBC: Recent Labs  Lab 03/18/23 1508 03/19/23 0548 03/20/23 1403 03/22/23 1004  WBC 10.1 10.6* 9.6 10.7*  NEUTROABS 7.6  --   --   --   HGB 15.6 13.7 13.6 11.0*  HCT 47.9 41.8 39.9 32.1*  MCV 88.7 87.8 86.6 84.9  PLT 269 243 233 206   Basic Metabolic Panel: Recent Labs  Lab 03/18/23 1508 03/19/23 0544 03/20/23 1403 03/22/23 1004  NA 150* 145 140 136  K 4.2 4.1 4.3 3.2*  CL 108 113* 104 106  CO2 24 21* 26 25  GLUCOSE 108* 183* 108* 101*  BUN 28* 23 12 12   CREATININE 1.08 0.97 0.83 0.82  CALCIUM 9.5 8.3* 8.6* 8.0*  MG  --  2.0 1.9  --   PHOS  --  3.5  --   --    GFR: Estimated Creatinine Clearance: 70.3 mL/min (by C-G formula based on SCr of 0.82 mg/dL). Liver Function Tests: Recent Labs  Lab 03/18/23 1508 03/19/23 0544  AST 87* 65*  ALT 36 32  ALKPHOS 60 53  BILITOT 0.8 1.0  PROT 7.1 5.9*  ALBUMIN 3.6 3.1*   No results for input(s): "LIPASE", "AMYLASE" in the last 168 hours. No results for input(s): "AMMONIA" in the last 168 hours. Coagulation Profile: Recent Labs  Lab 03/18/23 1508 03/19/23 0318  INR 1.5* 1.4*   Cardiac Enzymes: Recent Labs  Lab 03/18/23 1508 03/19/23 1351  CKTOTAL 1,433* 710*   BNP (last 3 results) No results for input(s): "PROBNP" in the last 8760 hours. HbA1C: No results for input(s): "HGBA1C" in the last 72 hours. CBG: Recent Labs  Lab 03/18/23 1952 03/20/23 1826  GLUCAP 145* 97   Lipid Profile: No results for input(s): "CHOL", "HDL", "LDLCALC", "TRIG", "CHOLHDL", "LDLDIRECT" in the last 72 hours. Thyroid Function Tests: No results for input(s): "TSH", "T4TOTAL", "FREET4", "T3FREE", "THYROIDAB" in the last 72 hours. Anemia Panel: No  results for input(s): "VITAMINB12", "FOLATE", "FERRITIN", "TIBC", "IRON", "RETICCTPCT" in the last 72 hours. Sepsis Labs: Recent Labs  Lab 03/18/23 1515  LATICACIDVEN 1.6    Recent Results (from the past 240 hours)  Blood Culture (routine x 2)     Status: Abnormal   Collection Time: 03/18/23  3:08 PM   Specimen: BLOOD LEFT WRIST  Result Value Ref Range Status   Specimen Description BLOOD LEFT WRIST  Final   Special Requests   Final    BOTTLES DRAWN AEROBIC AND ANAEROBIC Blood Culture adequate volume   Culture  Setup Time   Final    GRAM POSITIVE COCCI IN CLUSTERS AEROBIC BOTTLE ONLY Organism ID to follow    Culture (A)  Final    STAPHYLOCOCCUS HOMINIS THE SIGNIFICANCE OF ISOLATING THIS ORGANISM FROM A SINGLE SET OF BLOOD CULTURES WHEN MULTIPLE SETS ARE DRAWN IS UNCERTAIN. PLEASE NOTIFY THE MICROBIOLOGY DEPARTMENT WITHIN ONE WEEK IF SPECIATION AND SENSITIVITIES ARE REQUIRED.  Performed at [facility], [address] [place], [place] [address]    Report Status 03/20/2023 FINAL  Final  Blood Culture ID Panel (Reflexed)     Status: Abnormal   Collection Time: 03/18/23  3:08 PM  Result Value Ref Range Status   Enterococcus faecalis NOT DETECTED NOT DETECTED Final   Enterococcus Faecium NOT DETECTED NOT DETECTED Final   Listeria monocytogenes NOT DETECTED NOT DETECTED Final   Staphylococcus species DETECTED (A) NOT DETECTED Final    Comment: CRITICAL RESULT CALLED TO, READ BACK BY AND VERIFIED WITH: PHARMD L. Imogene Burn [id number] AT 1858, ADC    Staphylococcus aureus (BCID) NOT DETECTED NOT DETECTED Final   Staphylococcus epidermidis NOT DETECTED NOT DETECTED Final   Staphylococcus lugdunensis NOT DETECTED NOT DETECTED Final   Streptococcus species NOT DETECTED NOT DETECTED Final   Streptococcus agalactiae NOT DETECTED NOT DETECTED Final   Streptococcus pneumoniae NOT DETECTED NOT DETECTED Final   Streptococcus pyogenes NOT DETECTED NOT DETECTED Final   A.calcoaceticus-baumannii  NOT DETECTED NOT DETECTED Final   Bacteroides fragilis NOT DETECTED NOT DETECTED Final   Enterobacterales NOT DETECTED NOT DETECTED Final   Enterobacter cloacae complex NOT DETECTED NOT DETECTED Final   Escherichia coli NOT DETECTED NOT DETECTED Final   Klebsiella aerogenes NOT DETECTED NOT DETECTED Final   Klebsiella oxytoca NOT DETECTED NOT DETECTED Final   Klebsiella pneumoniae NOT DETECTED NOT DETECTED Final   Proteus species NOT DETECTED NOT DETECTED Final   Salmonella species NOT DETECTED NOT DETECTED Final   Serratia marcescens NOT DETECTED NOT DETECTED Final   Haemophilus influenzae NOT DETECTED NOT DETECTED Final   Neisseria meningitidis NOT DETECTED NOT DETECTED Final   Pseudomonas aeruginosa NOT DETECTED NOT DETECTED Final   Stenotrophomonas maltophilia NOT DETECTED NOT DETECTED Final   Candida albicans NOT DETECTED NOT DETECTED Final   Candida auris NOT DETECTED NOT DETECTED Final   Candida glabrata NOT DETECTED NOT DETECTED Final   Candida krusei NOT DETECTED NOT DETECTED Final   Candida parapsilosis NOT DETECTED NOT DETECTED Final   Candida tropicalis NOT DETECTED NOT DETECTED Final   Cryptococcus neoformans/gattii NOT DETECTED NOT DETECTED Final    Comment: Performed at [facility], [address], [place], [place] [address]  Resp panel by RT-PCR (RSV, Flu A&B, Covid) Anterior Nasal Swab     Status: None   Collection Time: 03/18/23  3:11 PM   Specimen: Anterior Nasal Swab  Result Value Ref Range Status   SARS Coronavirus 2 by RT PCR NEGATIVE NEGATIVE Final   Influenza A by PCR NEGATIVE NEGATIVE Final   Influenza B by PCR NEGATIVE NEGATIVE Final    Comment: (NOTE) The Xpert Xpress SARS-CoV-2/FLU/RSV plus assay is intended as an aid in the diagnosis of influenza from Nasopharyngeal swab specimens and should not be used as a sole basis for treatment. Nasal washings and aspirates are unacceptable for Xpert Xpress SARS-CoV-2/FLU/RSV testing.  Fact Sheet for  Patients: BloggerCourse.com  Fact Sheet for Healthcare Providers: SeriousBroker.it  This test is not yet approved or cleared by the [place] FDA and has been authorized for detection and/or diagnosis of SARS-CoV-2 by FDA under an Emergency Use Authorization (EUA). This EUA will remain in effect (meaning this test can be used) for the duration of the COVID-19 declaration under Section 564(b)(1) of the Act, 21 U.S.C. section 360bbb-3(b)(1), unless the authorization is terminated or revoked.     Resp Syncytial Virus by PCR NEGATIVE NEGATIVE Final    Comment: (NOTE) Fact Sheet  for Patients: BloggerCourse.com  Fact Sheet for Healthcare Providers: SeriousBroker.it  This test is not yet approved or cleared by the [place] FDA and has been authorized for detection and/or diagnosis of SARS-CoV-2 by FDA under an Emergency Use Authorization (EUA). This EUA will remain in effect (meaning this test can be used) for the duration of the COVID-19 declaration under Section 564(b)(1) of the Act, 21 U.S.C. section 360bbb-3(b)(1), unless the authorization is terminated or revoked.  Performed at [facility], [address] [place], [place] [address]   Blood Culture (routine x 2)     Status: None (Preliminary result)   Collection Time: 03/18/23  3:15 PM   Specimen: BLOOD RIGHT FOREARM  Result Value Ref Range Status   Specimen Description BLOOD RIGHT FOREARM  Final   Special Requests   Final    BOTTLES DRAWN AEROBIC AND ANAEROBIC Blood Culture adequate volume   Culture   Final    NO GROWTH 4 DAYS Performed at [facility], [address] [place], [place] [address]    Report Status PENDING  Incomplete  Gastrointestinal Panel by PCR , Stool     Status: Abnormal   Collection Time: 03/18/23  7:11 PM   Specimen: Stool  Result Value Ref Range Status   Campylobacter species  DETECTED (A) NOT DETECTED Final    Comment: CRITICAL RESULT CALLED TO, READ BACK BY AND VERIFIED WITH: Kathryne Hitch RN [facility] @ [phone] 03/21/23 lfd    Plesimonas shigelloides NOT DETECTED NOT DETECTED Final   Salmonella species NOT DETECTED NOT DETECTED Final   Yersinia enterocolitica NOT DETECTED NOT DETECTED Final   Vibrio species NOT DETECTED NOT DETECTED Final   Vibrio cholerae NOT DETECTED NOT DETECTED Final   Enteroaggregative E coli (EAEC) NOT DETECTED NOT DETECTED Final   Enteropathogenic E coli (EPEC) NOT DETECTED NOT DETECTED Final   Enterotoxigenic E coli (ETEC) NOT DETECTED NOT DETECTED Final   Shiga like toxin producing E coli (STEC) NOT DETECTED NOT DETECTED Final   Shigella/Enteroinvasive E coli (EIEC) NOT DETECTED NOT DETECTED Final   Cryptosporidium NOT DETECTED NOT DETECTED Final   Cyclospora cayetanensis NOT DETECTED NOT DETECTED Final   Entamoeba histolytica NOT DETECTED NOT DETECTED Final   Giardia lamblia NOT DETECTED NOT DETECTED Final   Adenovirus F40/41 NOT DETECTED NOT DETECTED Final   Astrovirus NOT DETECTED NOT DETECTED Final   Norovirus GI/GII DETECTED (A) NOT DETECTED Final    Comment: CRITICAL RESULT CALLED TO, READ BACK BY AND VERIFIED WITH: Kathryne Hitch RN [facility] @ [phone] 03/21/23 lfd    Rotavirus A NOT DETECTED NOT DETECTED Final   Sapovirus (I, II, IV, and V) NOT DETECTED NOT DETECTED Final    Comment: Performed at [facility], [address] [place], [place] [address]  Respiratory (~20 pathogens) panel by PCR     Status: None   Collection Time: 03/18/23  8:38 PM   Specimen: Nasopharyngeal Swab; Respiratory  Result Value Ref Range Status   Adenovirus NOT DETECTED NOT DETECTED Final   Coronavirus 229E NOT DETECTED NOT DETECTED Final    Comment: (NOTE) The Coronavirus on the Respiratory Panel, DOES NOT test for the novel  Coronavirus (2019 nCoV)    Coronavirus HKU1 NOT DETECTED NOT DETECTED Final   Coronavirus NL63 NOT DETECTED NOT DETECTED  Final   Coronavirus OC43 NOT DETECTED NOT DETECTED Final   Metapneumovirus NOT DETECTED NOT DETECTED Final   Rhinovirus / Enterovirus NOT DETECTED NOT DETECTED Final   Influenza A  NOT DETECTED NOT DETECTED Final   Influenza B NOT DETECTED NOT DETECTED Final   Parainfluenza Virus 1 NOT DETECTED NOT DETECTED Final   Parainfluenza Virus 2 NOT DETECTED NOT DETECTED Final   Parainfluenza Virus 3 NOT DETECTED NOT DETECTED Final   Parainfluenza Virus 4 NOT DETECTED NOT DETECTED Final   Respiratory Syncytial Virus NOT DETECTED NOT DETECTED Final   Bordetella pertussis NOT DETECTED NOT DETECTED Final   Bordetella Parapertussis NOT DETECTED NOT DETECTED Final   Chlamydophila pneumoniae NOT DETECTED NOT DETECTED Final   Mycoplasma pneumoniae NOT DETECTED NOT DETECTED Final    Comment: Performed at [facility], [address] [place], [place] [address]  C Difficile Quick Screen w PCR reflex     Status: None   Collection Time: 03/19/23  8:47 AM   Specimen: STOOL  Result Value Ref Range Status   C Diff antigen NEGATIVE NEGATIVE Final   C Diff toxin NEGATIVE NEGATIVE Final   C Diff interpretation No C. difficile detected.  Final    Comment: Performed at [facility], [address] [place], [place] [address]  Urine Culture (for pregnant, neutropenic or urologic patients or patients with an indwelling urinary catheter)     Status: None   Collection Time: 03/19/23  8:49 AM   Specimen: Urine, Clean Catch  Result Value Ref Range Status   Specimen Description URINE, CLEAN CATCH  Final   Special Requests NONE  Final   Culture   Final    NO GROWTH Performed at [facility], [address], [place], [place] [address]    Report Status 03/21/2023 FINAL  Final         Radiology Studies: DG Swallowing Func-Speech Pathology Result Date: 03/22/2023 Table formatting from the original result was not included. Images from the original result were not included. Modified Barium Swallow  Study Patient Details Name: Vuk Skillern MRN: [id number] Date of Birth: 1945-05-01 Today's Date: 03/22/2023 HPI/PMH: HPI: 78yo male admitted from [facility] memory care 03/18/23 with fever, decreased PO intake. PMH: seizures, HLD, dementia - nonverbal, PE. CXR negative for PNA. Pt had an event on 3/7 where he threw up his food and aspirated his vomit. Clinical Impression: Clinical Impression: Patient presents with a mild oral and a mod-severe pharyngeal phase dysphagia as per this MBS. Barium consistencies of thin, nectar thick and puree were tested. Patient with minimal laryngeal elevation, no observed anterior hyoid excursion. During swallow, epiglottis came into contact with posterior pharyngeal wall and no epiglottic inversion was observed. This led to majority of liquid and solid boluses remaining in vallecular sinus s/p swallow with trace to minimal amount on posterior pharyngeal wall. Vallecular sinus remained full of barium throughout the study. Airway did appear well protected with no instances of penetration or aspiration occuring. PES opening did appear adequate and no significant delays in transit of barium through PES or upper esophagus; no retrograde movement of barium above or below PES was observed. Collection of barium in vallecular sinus supports recent episode of emesis of food after eating as he is not able to effectively clear PO's in pharynx, leading to need to regurgitate and increased risk of aspiration. SLP recommending continue NPO status. Factors that may increase risk of adverse event in presence of aspiration Rubye Oaks & Clearance Coots 2021): Factors that may increase risk of adverse event in presence of aspiration Rubye Oaks & Clearance Coots 2021): Reduced cognitive function; Dependence for feeding and/or oral hygiene Recommendations/Plan: Swallowing Evaluation Recommendations Swallowing Evaluation Recommendations  Recommendations: NPO; Free water protocol after oral care; Ice chips PRN after oral care Liquid  Administration via: Cup; No straw Medication Administration: Via alternative means Supervision: Full assist for feeding Oral care recommendations: Oral care QID (4x/day); Oral care before ice chips/water Treatment Plan Treatment Plan Treatment recommendations: Therapy as outlined in treatment plan below Follow-up recommendations: Follow physicians's recommendations for discharge plan and follow up therapies Functional status assessment: Patient has had a recent decline in their functional status and demonstrates the ability to make significant improvements in function in a reasonable and predictable amount of time. Treatment frequency: Min 2x/week Treatment duration: 1 week Interventions: Trials of upgraded texture/liquids; Patient/family education Recommendations Recommendations for follow up therapy are one component of a multi-disciplinary discharge planning process, led by the attending physician.  Recommendations may be updated based on patient status, additional functional criteria and insurance authorization. Assessment: Orofacial Exam: Orofacial Exam Oral Cavity: Oral Hygiene: WFL Oral Cavity - Dentition: Adequate natural dentition Orofacial Anatomy: WFL Anatomy: Anatomy: Suspected cervical osteophytes Boluses Administered: Boluses Administered Boluses Administered: Thin liquids (Level 0); Mildly thick liquids (Level 2, nectar thick); Puree  Oral Impairment Domain: Oral Impairment Domain Lip Closure: No labial escape Tongue control during bolus hold: Not tested Bolus transport/lingual motion: Slow tongue motion Oral residue: Residue collection on oral structures Location of oral residue : Tongue; Palate Initiation of pharyngeal swallow : Pyriform sinuses  Pharyngeal Impairment Domain: Pharyngeal Impairment Domain Soft palate elevation: No bolus between soft palate (SP)/pharyngeal wall (PW) Laryngeal elevation: Minimal superior movement of thyroid cartilage with minimal approximation of arytenoids to  epiglottic petiole Anterior hyoid excursion: No anterior movement Epiglottic movement: No inversion Laryngeal vestibule closure: Incomplete, narrow column air/contrast in laryngeal vestibule Pharyngeal stripping wave : Present - diminished Pharyngeal contraction (A/P view only): N/A Pharyngoesophageal segment opening: Complete distension and complete duration, no obstruction of flow Tongue base retraction: Trace column of contrast or air between tongue base and PPW Pharyngeal residue: Majority of contrast within or on pharyngeal structures Location of pharyngeal residue: Valleculae; Pharyngeal wall  Esophageal Impairment Domain: Esophageal Impairment Domain Esophageal clearance upright position: Complete clearance, esophageal coating Pill: No data recorded Penetration/Aspiration Scale Score: Penetration/Aspiration Scale Score 1.  Material does not enter airway: Mildly thick liquids (Level 2, nectar thick); Puree; Thin liquids (Level 0) Compensatory Strategies: Compensatory Strategies Compensatory strategies: Yes Posterior head tilt: Ineffective Ineffective Posterior head tilt: Puree   General Information: Caregiver present: No  Diet Prior to this Study: NPO   No data recorded  Respiratory Status: WFL   Supplemental O2: None (Room air)   History of Recent Intubation: No  Behavior/Cognition: Lethargic/Drowsy; Alert; Cooperative; Requires cueing Self-Feeding Abilities: Dependent for feeding Baseline vocal quality/speech: Not observed Volitional Cough: Unable to elicit Volitional Swallow: Unable to elicit Exam Limitations: No limitations Goal Planning: Prognosis for improved oropharyngeal function: Guarded Barriers to Reach Goals: Cognitive deficits; Severity of deficits No data recorded No data recorded Consulted and agree with results and recommendations: Pt unable/family or caregiver not available Pain: Pain Assessment Pain Assessment: PAINAD Breathing: 0 Negative Vocalization: 0 Facial Expression: 0 Body Language: 0  Consolability: 0 PAINAD Score: 0 End of Session: Start Time:SLP Start Time (ACUTE ONLY): 1305 Stop Time: SLP Stop Time (ACUTE ONLY): 1325 Time Calculation:SLP Time Calculation (min) (ACUTE ONLY): 20 min Charges: SLP Evaluations $ SLP Speech Visit: 1 Visit SLP Evaluations $BSS Swallow: 1 Procedure $MBS Swallow: 1 Procedure SLP visit diagnosis: SLP Visit Diagnosis: Dysphagia, oropharyngeal phase (R13.12) Past Medical History: Past Medical History: Diagnosis Date  Alzheimer disease (HCC)   COVID   Hypercholesteremia   Hypertension   Seizures (HCC)  Past Surgical History: Past Surgical History: Procedure Laterality Date  KNEE SURGERY    MANDIBLE SURGERY   Angela Nevin, MA, CCC-SLP Speech Therapy   DG Abd 1 View Result Date: 03/21/2023 CLINICAL DATA:  78 year old male with nausea and vomiting. EXAM: ABDOMEN - 1 VIEW COMPARISON:  Pelvis CT 09/15/2021.  Portable chest yesterday. FINDINGS: Supine AP view at 0906 hours. Extensive Calcified aortic atherosclerosis. Negative lung bases. Scoliosis. Nonobstructed bowel gas pattern. Not all of the left abdomen is visible on this single view. No acute osseous abnormality identified. IMPRESSION: Non obstructed visible bowel gas pattern. Aortic Atherosclerosis (ICD10-I70.0). Electronically Signed   By: Odessa Fleming M.D.   On: 03/21/2023 12:52   DG CHEST PORT 1 VIEW Result Date: 03/20/2023 CLINICAL DATA:  Cough. EXAM: PORTABLE CHEST 1 VIEW COMPARISON:  03/18/2023 FINDINGS: The cardiomediastinal contours are normal. Mild bibasilar atelectasis. Pulmonary vasculature is normal. No pleural effusion or pneumothorax. No acute osseous abnormalities are seen. IMPRESSION: Mild bibasilar atelectasis.  No confluent airspace disease. Electronically Signed   By: Narda Rutherford M.D.   On: 03/20/2023 18:33        Scheduled Meds:  budesonide (PULMICORT) nebulizer solution  0.25 mg Nebulization BID   donepezil  10 mg Oral QHS   enoxaparin (LOVENOX) injection  60 mg Subcutaneous Q12H    pantoprazole (PROTONIX) IV  40 mg Intravenous Q12H   sertraline  50 mg Oral Daily   thiamine (VITAMIN B1) injection  100 mg Intravenous Daily   triamcinolone 0.1 % cream : eucerin  1 Application Topical BID   Continuous Infusions:  azithromycin 500 mg (03/22/23 1026)   cefTRIAXone (ROCEPHIN)  IV 2 g (03/22/23 1012)   dextrose 75 mL/hr at 03/21/23 1029   lacosamide (VIMPAT) IV 50 mg (03/22/23 1026)     LOS: 4 days    Time spent: 35 minutes    Aracelys Glade A Kila Godina, MD [facility]   If 7PM-7AM, please contact night-coverage [email or web address]  03/22/2023, 3:26 PM

## 2023-03-22 NOTE — Progress Notes (Signed)
 Speech Language Pathology Treatment: Dysphagia  Patient Details Name: Jonathan Berry MRN: [id number] DOB: [birth year] Today's Date: 03/22/2023 Time: 1410-1430 SLP Time Calculation (min) (ACUTE ONLY): 20 min  Assessment / Plan / Recommendation Clinical Impression  Patient seen by SLP for skilled treatment focused on dysphagia goals. Patient's spouse was present via phone and patient's son was in the room. Patient himself was asleep. SLP reviewed today's MBS, showed son some of the images. Family denies patient having any h/o dysphagia and at [facility] memory care facility, he eats well, and is usually able to feed himself. 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 Family does not want to pursue a PEG and would like him to be able to eat for as long as he can. SLP in agreement with this. Recommendation is to initiate PO diet of Dys 1 (puree) solids, thin liquids with careful feeding and close monitoring; encourage patient to self feed to help with neuro input and adequate oral preparatory phase of swallowing; avoid large volume bites/sips, avoid rapid/quick feeding/eating and to stop feeding if patient exhibiting overt coughing, throat clearing or s/s of need to regurgitate.  SLP will follow for toleration and continued education.   HPI HPI: 78yo male admitted from [facility] memory care 03/18/23 with fever, decreased PO intake. PMH: seizures, HLD, dementia - nonverbal, PE. CXR negative for PNA. Pt had an event on 3/7 where he threw up his food and aspirated his vomit.      SLP Plan  Continue with current plan of care      Recommendations for follow up therapy are one component of a multi-disciplinary discharge planning process, led by the attending physician.  Recommendations may be updated based  on patient status, additional functional criteria and insurance authorization.    Recommendations  Diet recommendations: Dysphagia 1 (puree);Thin liquid Liquids provided via: Cup;Straw Medication Administration: Crushed with puree Supervision: Full supervision/cueing for compensatory strategies;Staff to assist with self feeding Compensations: Minimize environmental distractions;Slow rate;Small sips/bites Postural Changes and/or Swallow Maneuvers: Seated upright 90 degrees;Upright 30-60 min after meal                  Oral care BID;Staff/trained caregiver to provide oral care   Frequent or constant Supervision/Assistance Dysphagia, oropharyngeal phase (R13.12)     Continue with current plan of care    Angela Nevin, MA, CCC-SLP Speech Therapy

## 2023-03-22 NOTE — Plan of Care (Signed)
  Problem: Fluid Volume: Goal: Hemodynamic stability will improve Outcome: Progressing   Problem: Respiratory: Goal: Ability to maintain adequate ventilation will improve Outcome: Progressing   Problem: Nutrition: Goal: Adequate nutrition will be maintained Outcome: Progressing   Problem: Pain Managment: Goal: General experience of comfort will improve and/or be controlled Outcome: Progressing

## 2023-03-22 NOTE — Plan of Care (Signed)
  Problem: Clinical Measurements: Goal: Diagnostic test results will improve Outcome: Progressing Goal: Signs and symptoms of infection will decrease Outcome: Progressing

## 2023-03-22 NOTE — Procedures (Signed)
 Modified Barium Swallow Study  Patient Details  Name: Hassaan Crite MRN: [id number] Date of Birth: Jun 15, 1945  Today's Date: 03/22/2023  Modified Barium Swallow completed.  Full report located under Chart Review in the Imaging Section.  History of Present Illness 78yo male admitted from [facility] memory care 03/18/23 with fever, decreased PO intake. PMH: seizures, HLD, dementia - nonverbal, PE. CXR negative for PNA. Pt had an event on 3/7 where he threw up his food and aspirated his vomit.   Clinical Impression Patient presents with a mild oral and a mod-severe pharyngeal phase dysphagia as per this MBS. Barium consistencies of thin, nectar thick and puree were tested. Patient with minimal laryngeal elevation, no observed anterior hyoid excursion. During swallow, epiglottis came into contact with posterior pharyngeal wall and no epiglottic inversion was observed. This led to majority of liquid and solid boluses remaining in vallecular sinus s/p swallow with trace to minimal amount on posterior pharyngeal wall. Vallecular sinus remained full of barium throughout the study. Airway did appear well protected with no instances of penetration or aspiration occuring. PES opening did appear adequate and no significant delays in transit of barium through PES or upper esophagus; no retrograde movement of barium above or below PES was observed. Collection of barium in vallecular sinus supports recent episode of emesis of food after eating as he is not able to effectively clear PO's in pharynx, leading to need to regurgitate and increased risk of aspiration. SLP recommending continue NPO status.   Factors that may increase risk of adverse event in presence of aspiration [employer] 2021): Reduced cognitive function;Dependence for feeding and/or oral hygiene  Swallow Evaluation Recommendations Recommendations: NPO;Free water protocol after oral care;Ice chips PRN after oral care Liquid Administration  via: Cup;No straw Medication Administration: Via alternative means Supervision: Full assist for feeding Oral care recommendations: Oral care QID (4x/day);Oral care before ice chips/water     Angela Nevin, MA, CCC-SLP Speech Therapy

## 2023-03-23 DIAGNOSIS — E870 Hyperosmolality and hypernatremia: Secondary | ICD-10-CM | POA: Diagnosis not present

## 2023-03-23 LAB — BASIC METABOLIC PANEL
Anion gap: 8 (ref 5–15)
BUN: 7 mg/dL — ABNORMAL LOW (ref 8–23)
CO2: 26 mmol/L (ref 22–32)
Calcium: 8.5 mg/dL — ABNORMAL LOW (ref 8.9–10.3)
Chloride: 105 mmol/L (ref 98–111)
Creatinine, Ser: 0.82 mg/dL (ref 0.61–1.24)
GFR, Estimated: >60 mL/min (ref >60)
Glucose, Bld: 97 mg/dL (ref 70–99)
Potassium: 4.1 mmol/L (ref 3.5–5.1)
Sodium: 139 mmol/L (ref 135–145)

## 2023-03-23 LAB — CULTURE, BLOOD (ROUTINE X 2)
Culture: NO GROWTH
Special Requests: ADEQUATE

## 2023-03-23 MED ORDER — POTASSIUM CHLORIDE 10 MEQ/100ML IV SOLN
10.0000 meq | INTRAVENOUS | Status: AC
  Administered 2023-03-23 (×3): 10 meq via INTRAVENOUS
  Filled 2023-03-23 (×3): qty 100

## 2023-03-23 NOTE — Progress Notes (Signed)
 Unable to complete patient admission documentation dt AMS

## 2023-03-23 NOTE — Evaluation (Signed)
 Physical Therapy Evaluation Patient Details Name: Jonathan Berry MRN: [id number] DOB: [birth year] Today's Date: 03/23/2023  History of Present Illness  78 y.o. male presents to [facility] hospital on 03/18/2023 with fever, poor PO intake, recent noro virus outbreak at the facility where he resides. PMH includes seizure, dementia, PE, nonverbal at baseline.  Clinical Impression  Pt presents to PT with deficits in strength, power, functional mobility, gait, and balance. Pt's spouse is present for evaluation and reports the pt requires physical assistance for bed mobility, transfers and ambulation at baseline. Pt's spouse reports increased weakness compared to baseline, and the pt demonstrates a strong posterior lean as well as some impaired coordination of sequencing of BLE during the transfer. Pt will benefit from PT services when returning to [facility] in an effort to improve stability and reduce falls risk.      If plan is discharge home, recommend the following: A lot of help with walking and/or transfers;A lot of help with bathing/dressing/bathroom;Assistance with cooking/housework;Assistance with feeding;Direct supervision/assist for medications management;Direct supervision/assist for financial management;Assist for transportation;Help with stairs or ramp for entrance;Supervision due to cognitive status   Can travel by private vehicle        Equipment Recommendations None recommended by PT  Recommendations for Other Services       Functional Status Assessment Patient has had a recent decline in their functional status and demonstrates the ability to make significant improvements in function in a reasonable and predictable amount of time.     Precautions / Restrictions Precautions Precautions: Fall Recall of Precautions/Restrictions: Impaired Precaution/Restrictions Comments: posterior lean, nonverbal Restrictions Weight Bearing Restrictions Per Provider Order: No      Mobility  Bed  Mobility Overal bed mobility: Needs Assistance Bed Mobility: Supine to Sit     Supine to sit: Max assist, HOB elevated          Transfers Overall transfer level: Needs assistance Equipment used: 2 person hand held assist Transfers: Sit to/from Stand, Bed to chair/wheelchair/BSC Sit to Stand: Mod assist, +2 physical assistance   Step pivot transfers: Mod assist, +2 physical assistance       General transfer comment: posterior lean in standing    Ambulation/Gait                  Stairs            Wheelchair Mobility     Tilt Bed    Modified Rankin (Stroke Patients Only)       Balance Overall balance assessment: Needs assistance Sitting-balance support: No upper extremity supported, Feet supported Sitting balance-Leahy Scale: Poor Sitting balance - Comments: minA Postural control: Posterior lean Standing balance support: Bilateral upper extremity supported Standing balance-Leahy Scale: Poor Standing balance comment: modA due to posterior lean                             Pertinent Vitals/Pain Pain Assessment Pain Assessment: PAINAD Breathing: normal Negative Vocalization: none Facial Expression: smiling or inexpressive Body Language: relaxed Consolability: no need to console PAINAD Score: 0    Home Living Family/patient expects to be discharged to:: Other (Comment)                   Additional Comments: return to Memory care at [facility]    Prior Function Prior Level of Function : Needs assist  Cognitive Assist : Mobility (cognitive);ADLs (cognitive) Mobility (Cognitive): Step by step cues ADLs (Cognitive): Step by step cues Physical Assist :  Mobility (physical);ADLs (physical) Mobility (physical): Bed mobility;Transfers;Gait ADLs (physical): Feeding;Grooming;Bathing;Toileting;Dressing;IADLs Mobility Comments: spouse reports the pt requires assistance for bed mobility and transfers, also able to ambulate with staff  assistance. does not utilize DME       Extremity/Trunk Assessment   Upper Extremity Assessment Upper Extremity Assessment: Generalized weakness    Lower Extremity Assessment Lower Extremity Assessment: Generalized weakness    Cervical / Trunk Assessment Cervical / Trunk Assessment: Normal  Communication   Communication Communication: Impaired Factors Affecting Communication: Difficulty expressing self (nonverbal)    Cognition Arousal: Alert Behavior During Therapy: Flat affect   PT - Cognitive impairments: History of cognitive impairments                       PT - Cognition Comments: pt with advanced dementia, is nonverbal Following commands: Impaired Following commands impaired: Follows one step commands inconsistently     Cueing Cueing Techniques: Verbal cues, Gestural cues, Tactile cues     General Comments General comments (skin integrity, edema, etc.): VSS on RA    Exercises     Assessment/Plan    PT Assessment Patient needs continued PT services  PT Problem List Decreased strength;Decreased activity tolerance;Decreased balance;Decreased mobility;Decreased cognition;Decreased knowledge of use of DME;Decreased safety awareness;Decreased knowledge of precautions       PT Treatment Interventions DME instruction;Gait training;Functional mobility training;Therapeutic activities;Therapeutic exercise;Balance training;Neuromuscular re-education;Cognitive remediation;Patient/family education    PT Goals (Current goals can be found in the Care Plan section)  Acute Rehab PT Goals Patient Stated Goal: to improve strength, return to memory care PT Goal Formulation: With family Time For Goal Achievement: 04/06/23 Potential to Achieve Goals: Fair    Frequency Min 2X/week     Co-evaluation               AM-PAC PT "6 Clicks" Mobility  Outcome Measure Help needed turning from your back to your side while in a flat bed without using bedrails?: A  Lot Help needed moving from lying on your back to sitting on the side of a flat bed without using bedrails?: A Lot Help needed moving to and from a bed to a chair (including a wheelchair)?: A Lot Help needed standing up from a chair using your arms (e.g., wheelchair or bedside chair)?: A Lot Help needed to walk in hospital room?: Total Help needed climbing 3-5 steps with a railing? : Total 6 Click Score: 10    End of Session   Activity Tolerance: Patient tolerated treatment well Patient left: in chair;with call bell/phone within reach;with chair alarm set;with family/visitor present Nurse Communication: Mobility status PT Visit Diagnosis: Other abnormalities of gait and mobility (R26.89);Muscle weakness (generalized) (M62.81)    Time: 1610-9604 PT Time Calculation (min) (ACUTE ONLY): 12 min   Charges:   PT Evaluation $PT Eval Low Complexity: 1 Low   PT General Charges $$ ACUTE PT VISIT: 1 Visit         Arlyss Gandy, PT, DPT Acute Rehabilitation Office [phone]   Arlyss Gandy 03/23/2023, 11:12 AM

## 2023-03-23 NOTE — Plan of Care (Signed)
  Problem: Fluid Volume: Goal: Hemodynamic stability will improve Outcome: Progressing   Problem: Clinical Measurements: Goal: Will remain free from infection Outcome: Progressing   Problem: Clinical Measurements: Goal: Respiratory complications will improve Outcome: Progressing   Problem: Nutrition: Goal: Adequate nutrition will be maintained Outcome: Progressing   Problem: Pain Managment: Goal: General experience of comfort will improve and/or be controlled Outcome: Progressing

## 2023-03-23 NOTE — Progress Notes (Addendum)
 PROGRESS NOTE    Jonathan Berry  WJX:[id number] DOB: 1945-12-29 DOA: 03/18/2023 PCP: Collene Mares, [place]   Brief Narrative: 78 year old with past medical history significant for seizure, dementia, pulmonary embolism and PE December 2022 presented via EMS from memory care facility due to fever, poor oral intake, patient is nonverbal at baseline.  Admission was requested for sepsis.  Per wife patient is nonverbal at baseline, say occasional words.  Norovirus outbreak at his facility.  Patient had diarrhea and fever was sent for further evaluation.   Assessment & Plan:   Principal Problem:   Fever Active Problems:   DNR (do not resuscitate)   Essential hypertension   Pulmonary emboli (HCC)   Seizure (HCC)   Alzheimer disease (HCC)   Sepsis (HCC)   Hypernatremia  1-Gastroenteritis secondary to Campylobacter and norovirus Fever, HO of Diarrhea;  -Facility with Norovirus outbreak.  -GI pathogen: Campylobacter and norovirus and C diff negative -Urinalysis negative , urine culture P -Respiratory panel negative -Influenza A, B and RSV negative -Chest x-ray: No active disease He vomited multiple times in the hospital,  KUB obtained negative for obstruction Continue with supportive care Started azithromycin for Campylobacter Improved.   Hypernatremia, dehydration: -In the setting of poor oral intake dehydration -Continue with IV fluids D5   Sepsis: In the setting of gastroenteritis, norovirus and Campylobacter -Presents with fever temperature 101, respiration rate 27, tachycardia -No source of infection identified at this point but there is possible norovirus outbreak at his facility -Continue with IV fluids and IV antibiotics -Chest X-ray no active disease -GI pathogen Campylobacter, Norovirus  , C diff negative  -X-ray bilateral bases atelectasis -Stop Vancomycin and Flagyl.  -Continue with ceftriaxone.   Alzheimer  disease: -Resume Aricept.   Dysphagia:  High risk for  aspiration, immobility of epiglottis leading to excessive pharyngeal residuals of PO's . Speech discussed with family, plan is for Dysphagia 1 thin liquids.  Palliative consulted goals of care  Seizure disorder -Continue with  Vimpat.   History of PE -Resume Eliquis.   1-2 Blood culture Positive for staph Hominis, likely contaminate.   Essential hypertension -PRN Hydralazine.  -not on meds prior to admission.    Aspiration; PNA Came back to see patient he develops worsening cough after drinking water and Vomiting.  -Albuterol given  -wean off oxygen.  -X-ray bilateral atelectasis -Continue with IV ceftriaxone  Hypokalemia; replaced.    Estimated body mass index is 22.75 kg/m as calculated from the following:   Height as of this encounter: 5\' 7"  (1.702 m).   Weight as of this encounter: 65.9 kg.   DVT prophylaxis: heparin  Code Status: Full code Family Communication: DNR Disposition Plan:  Status is: Inpatient Remains inpatient appropriate because: Management of dehydration.     Consultants:  None  Procedures:  None  Antimicrobials:    Subjective: He is alert, plan to see how he does with current diet.  Palliative care consulted for goals of care, review MOST form, Disposition. patient high risk for aspiration, I dont know how he will do with PT>   Objective: Vitals:   03/22/23 1401 03/22/23 2005 03/22/23 2019 03/23/23 0811  BP: (!) 100/55  128/83   Pulse: (!) 54  (!) 55   Resp: 16  16   Temp: 98.3 F (36.8 C)  98.1 F (36.7 C)   TempSrc: Oral  Oral   SpO2: 90% 92% 99% 94%  Weight:      Height:        Intake/Output  Summary (Last 24 hours) at 03/23/2023 1410 Last data filed at 03/23/2023 1301 Gross per 24 hour  Intake 1071.25 ml  Output --  Net 1071.25 ml   Filed Weights   03/20/23 2038  Weight: 65.9 kg    Examination:  General exam: NAD Respiratory system: CTA Cardiovascular system: S 1, S 2 RRR Gastrointestinal system: BS present,  soft, nt Central nervous system: ALert Extremities: No edema     Data Reviewed: I have personally reviewed following labs and imaging studies  CBC: Recent Labs  Lab 03/18/23 1508 03/19/23 0548 03/20/23 1403 03/22/23 1004  WBC 10.1 10.6* 9.6 10.7*  NEUTROABS 7.6  --   --   --   HGB 15.6 13.7 13.6 11.0*  HCT 47.9 41.8 39.9 32.1*  MCV 88.7 87.8 86.6 84.9  PLT 269 243 233 206   Basic Metabolic Panel: Recent Labs  Lab 03/18/23 1508 03/19/23 0544 03/20/23 1403 03/22/23 1004  NA 150* 145 140 136  K 4.2 4.1 4.3 3.2*  CL 108 113* 104 106  CO2 24 21* 26 25  GLUCOSE 108* 183* 108* 101*  BUN 28* 23 12 12   CREATININE 1.08 0.97 0.83 0.82  CALCIUM 9.5 8.3* 8.6* 8.0*  MG  --  2.0 1.9  --   PHOS  --  3.5  --   --    GFR: Estimated Creatinine Clearance: 70.3 mL/min (by C-G formula based on SCr of 0.82 mg/dL). Liver Function Tests: Recent Labs  Lab 03/18/23 1508 03/19/23 0544  AST 87* 65*  ALT 36 32  ALKPHOS 60 53  BILITOT 0.8 1.0  PROT 7.1 5.9*  ALBUMIN 3.6 3.1*   No results for input(s): "LIPASE", "AMYLASE" in the last 168 hours. No results for input(s): "AMMONIA" in the last 168 hours. Coagulation Profile: Recent Labs  Lab 03/18/23 1508 03/19/23 0318  INR 1.5* 1.4*   Cardiac Enzymes: Recent Labs  Lab 03/18/23 1508 03/19/23 1351  CKTOTAL 1,433* 710*   BNP (last 3 results) No results for input(s): "PROBNP" in the last 8760 hours. HbA1C: No results for input(s): "HGBA1C" in the last 72 hours. CBG: Recent Labs  Lab 03/18/23 1952 03/20/23 1826  GLUCAP 145* 97   Lipid Profile: No results for input(s): "CHOL", "HDL", "LDLCALC", "TRIG", "CHOLHDL", "LDLDIRECT" in the last 72 hours. Thyroid Function Tests: No results for input(s): "TSH", "T4TOTAL", "FREET4", "T3FREE", "THYROIDAB" in the last 72 hours. Anemia Panel: No results for input(s): "VITAMINB12", "FOLATE", "FERRITIN", "TIBC", "IRON", "RETICCTPCT" in the last 72 hours. Sepsis Labs: Recent Labs   Lab 03/18/23 1515  LATICACIDVEN 1.6    Recent Results (from the past 240 hours)  Blood Culture (routine x 2)     Status: Abnormal   Collection Time: 03/18/23  3:08 PM   Specimen: BLOOD LEFT WRIST  Result Value Ref Range Status   Specimen Description BLOOD LEFT WRIST  Final   Special Requests   Final    BOTTLES DRAWN AEROBIC AND ANAEROBIC Blood Culture adequate volume   Culture  Setup Time   Final    GRAM POSITIVE COCCI IN CLUSTERS AEROBIC BOTTLE ONLY Organism ID to follow    Culture (A)  Final    STAPHYLOCOCCUS HOMINIS THE SIGNIFICANCE OF ISOLATING THIS ORGANISM FROM A SINGLE SET OF BLOOD CULTURES WHEN MULTIPLE SETS ARE DRAWN IS UNCERTAIN. PLEASE NOTIFY THE MICROBIOLOGY DEPARTMENT WITHIN ONE WEEK IF SPECIATION AND SENSITIVITIES ARE REQUIRED. Performed at [facility], [address] [place], [place] [address]    Report Status 03/20/2023 FINAL  Final  Blood Culture ID Panel (Reflexed)     Status: Abnormal   Collection Time: 03/18/23  3:08 PM  Result Value Ref Range Status   Enterococcus faecalis NOT DETECTED NOT DETECTED Final   Enterococcus Faecium NOT DETECTED NOT DETECTED Final   Listeria monocytogenes NOT DETECTED NOT DETECTED Final   Staphylococcus species DETECTED (A) NOT DETECTED Final    Comment: CRITICAL RESULT CALLED TO, READ BACK BY AND VERIFIED WITH: PHARMD L. Imogene Burn [id number] AT 1858, ADC    Staphylococcus aureus (BCID) NOT DETECTED NOT DETECTED Final   Staphylococcus epidermidis NOT DETECTED NOT DETECTED Final   Staphylococcus lugdunensis NOT DETECTED NOT DETECTED Final   Streptococcus species NOT DETECTED NOT DETECTED Final   Streptococcus agalactiae NOT DETECTED NOT DETECTED Final   Streptococcus pneumoniae NOT DETECTED NOT DETECTED Final   Streptococcus pyogenes NOT DETECTED NOT DETECTED Final   A.calcoaceticus-baumannii NOT DETECTED NOT DETECTED Final   Bacteroides fragilis NOT DETECTED NOT DETECTED Final   Enterobacterales NOT DETECTED NOT DETECTED  Final   Enterobacter cloacae complex NOT DETECTED NOT DETECTED Final   Escherichia coli NOT DETECTED NOT DETECTED Final   Klebsiella aerogenes NOT DETECTED NOT DETECTED Final   Klebsiella oxytoca NOT DETECTED NOT DETECTED Final   Klebsiella pneumoniae NOT DETECTED NOT DETECTED Final   Proteus species NOT DETECTED NOT DETECTED Final   Salmonella species NOT DETECTED NOT DETECTED Final   Serratia marcescens NOT DETECTED NOT DETECTED Final   Haemophilus influenzae NOT DETECTED NOT DETECTED Final   Neisseria meningitidis NOT DETECTED NOT DETECTED Final   Pseudomonas aeruginosa NOT DETECTED NOT DETECTED Final   Stenotrophomonas maltophilia NOT DETECTED NOT DETECTED Final   Candida albicans NOT DETECTED NOT DETECTED Final   Candida auris NOT DETECTED NOT DETECTED Final   Candida glabrata NOT DETECTED NOT DETECTED Final   Candida krusei NOT DETECTED NOT DETECTED Final   Candida parapsilosis NOT DETECTED NOT DETECTED Final   Candida tropicalis NOT DETECTED NOT DETECTED Final   Cryptococcus neoformans/gattii NOT DETECTED NOT DETECTED Final    Comment: Performed at [facility], [address] [place], [place] [address]  Resp panel by RT-PCR (RSV, Flu A&B, Covid) Anterior Nasal Swab     Status: None   Collection Time: 03/18/23  3:11 PM   Specimen: Anterior Nasal Swab  Result Value Ref Range Status   SARS Coronavirus 2 by RT PCR NEGATIVE NEGATIVE Final   Influenza A by PCR NEGATIVE NEGATIVE Final   Influenza B by PCR NEGATIVE NEGATIVE Final    Comment: (NOTE) The Xpert Xpress SARS-CoV-2/FLU/RSV plus assay is intended as an aid in the diagnosis of influenza from Nasopharyngeal swab specimens and should not be used as a sole basis for treatment. Nasal washings and aspirates are unacceptable for Xpert Xpress SARS-CoV-2/FLU/RSV testing.  Fact Sheet for Patients: BloggerCourse.com  Fact Sheet for Healthcare  Providers: SeriousBroker.it  This test is not yet approved or cleared by the [place] FDA and has been authorized for detection and/or diagnosis of SARS-CoV-2 by FDA under an Emergency Use Authorization (EUA). This EUA will remain in effect (meaning this test can be used) for the duration of the COVID-19 declaration under Section 564(b)(1) of the Act, 21 U.S.C. section 360bbb-3(b)(1), unless the authorization is terminated or revoked.     Resp Syncytial Virus by PCR NEGATIVE NEGATIVE Final    Comment: (NOTE) Fact Sheet for Patients: BloggerCourse.com  Fact Sheet for Healthcare Providers: SeriousBroker.it  This test is not yet approved or cleared by the  [place] [employer] trader and has been authorized for detection and/or diagnosis of SARS-CoV-2 by FDA under an TEFL teacher (EUA). This EUA will remain in effect (meaning this test can be used) for the duration of the COVID-19 declaration under Section 564(b)(1) of the Act, 21 U.S.C. section 360bbb-3(b)(1), unless the authorization is terminated or revoked.  Performed at [facility], [address] [place], [place] [address]   Blood Culture (routine x 2)     Status: None   Collection Time: 03/18/23  3:15 PM   Specimen: BLOOD RIGHT FOREARM  Result Value Ref Range Status   Specimen Description BLOOD RIGHT FOREARM  Final   Special Requests   Final    BOTTLES DRAWN AEROBIC AND ANAEROBIC Blood Culture adequate volume   Culture   Final    NO GROWTH 5 DAYS Performed at [facility], [address] [place], [place] [address]    Report Status 03/23/2023 FINAL  Final  Gastrointestinal Panel by PCR , Stool     Status: Abnormal   Collection Time: 03/18/23  7:11 PM   Specimen: Stool  Result Value Ref Range Status   Campylobacter species DETECTED (A) NOT DETECTED Final    Comment: CRITICAL RESULT CALLED TO, READ BACK BY AND VERIFIED  WITH: Kathryne Hitch RN [facility] @ [phone] 03/21/23 lfd    Plesimonas shigelloides NOT DETECTED NOT DETECTED Final   Salmonella species NOT DETECTED NOT DETECTED Final   Yersinia enterocolitica NOT DETECTED NOT DETECTED Final   Vibrio species NOT DETECTED NOT DETECTED Final   Vibrio cholerae NOT DETECTED NOT DETECTED Final   Enteroaggregative E coli (EAEC) NOT DETECTED NOT DETECTED Final   Enteropathogenic E coli (EPEC) NOT DETECTED NOT DETECTED Final   Enterotoxigenic E coli (ETEC) NOT DETECTED NOT DETECTED Final   Shiga like toxin producing E coli (STEC) NOT DETECTED NOT DETECTED Final   Shigella/Enteroinvasive E coli (EIEC) NOT DETECTED NOT DETECTED Final   Cryptosporidium NOT DETECTED NOT DETECTED Final   Cyclospora cayetanensis NOT DETECTED NOT DETECTED Final   Entamoeba histolytica NOT DETECTED NOT DETECTED Final   Giardia lamblia NOT DETECTED NOT DETECTED Final   Adenovirus F40/41 NOT DETECTED NOT DETECTED Final   Astrovirus NOT DETECTED NOT DETECTED Final   Norovirus GI/GII DETECTED (A) NOT DETECTED Final    Comment: CRITICAL RESULT CALLED TO, READ BACK BY AND VERIFIED WITH: Kathryne Hitch RN [facility] @ [phone] 03/21/23 lfd    Rotavirus A NOT DETECTED NOT DETECTED Final   Sapovirus (I, II, IV, and V) NOT DETECTED NOT DETECTED Final    Comment: Performed at [facility], [address] [place], [place] [address]  Respiratory (~20 pathogens) panel by PCR     Status: None   Collection Time: 03/18/23  8:38 PM   Specimen: Nasopharyngeal Swab; Respiratory  Result Value Ref Range Status   Adenovirus NOT DETECTED NOT DETECTED Final   Coronavirus 229E NOT DETECTED NOT DETECTED Final    Comment: (NOTE) The Coronavirus on the Respiratory Panel, DOES NOT test for the novel  Coronavirus (2019 nCoV)    Coronavirus HKU1 NOT DETECTED NOT DETECTED Final   Coronavirus NL63 NOT DETECTED NOT DETECTED Final   Coronavirus OC43 NOT DETECTED NOT DETECTED Final   Metapneumovirus NOT DETECTED NOT  DETECTED Final   Rhinovirus / Enterovirus NOT DETECTED NOT DETECTED Final   Influenza A NOT DETECTED NOT DETECTED Final   Influenza B NOT DETECTED NOT DETECTED Final   Parainfluenza Virus 1 NOT DETECTED NOT  DETECTED Final   Parainfluenza Virus 2 NOT DETECTED NOT DETECTED Final   Parainfluenza Virus 3 NOT DETECTED NOT DETECTED Final   Parainfluenza Virus 4 NOT DETECTED NOT DETECTED Final   Respiratory Syncytial Virus NOT DETECTED NOT DETECTED Final   Bordetella pertussis NOT DETECTED NOT DETECTED Final   Bordetella Parapertussis NOT DETECTED NOT DETECTED Final   Chlamydophila pneumoniae NOT DETECTED NOT DETECTED Final   Mycoplasma pneumoniae NOT DETECTED NOT DETECTED Final    Comment: Performed at [facility], [address] [place], [place] [address]  C Difficile Quick Screen w PCR reflex     Status: None   Collection Time: 03/19/23  8:47 AM   Specimen: STOOL  Result Value Ref Range Status   C Diff antigen NEGATIVE NEGATIVE Final   C Diff toxin NEGATIVE NEGATIVE Final   C Diff interpretation No C. difficile detected.  Final    Comment: Performed at [facility], [address], [place], [place] [address]  Urine Culture (for pregnant, neutropenic or urologic patients or patients with an indwelling urinary catheter)     Status: None   Collection Time: 03/19/23  8:49 AM   Specimen: Urine, Clean Catch  Result Value Ref Range Status   Specimen Description URINE, CLEAN CATCH  Final   Special Requests NONE  Final   Culture   Final    NO GROWTH Performed at [facility], [address], [place], [place] [address]    Report Status 03/21/2023 FINAL  Final         Radiology Studies: DG Swallowing Func-Speech Pathology Result Date: 03/22/2023 Table formatting from the original result was not included. Images from the original result were not included. Modified Barium Swallow Study Patient Details Name: Delan Ksiazek MRN: [id number] Date of Birth: 26-Oct-1945 Today's Date:  03/22/2023 HPI/PMH: HPI: 78yo male admitted from [facility] memory care 03/18/23 with fever, decreased PO intake. PMH: seizures, HLD, dementia - nonverbal, PE. CXR negative for PNA. Pt had an event on 3/7 where he threw up his food and aspirated his vomit. Clinical Impression: Clinical Impression: Patient presents with a mild oral and a mod-severe pharyngeal phase dysphagia as per this MBS. Barium consistencies of thin, nectar thick and puree were tested. Patient with minimal laryngeal elevation, no observed anterior hyoid excursion. During swallow, epiglottis came into contact with posterior pharyngeal wall and no epiglottic inversion was observed. This led to majority of liquid and solid boluses remaining in vallecular sinus s/p swallow with trace to minimal amount on posterior pharyngeal wall. Vallecular sinus remained full of barium throughout the study. Airway did appear well protected with no instances of penetration or aspiration occuring. PES opening did appear adequate and no significant delays in transit of barium through PES or upper esophagus; no retrograde movement of barium above or below PES was observed. Collection of barium in vallecular sinus supports recent episode of emesis of food after eating as he is not able to effectively clear PO's in pharynx, leading to need to regurgitate and increased risk of aspiration. SLP recommending continue NPO status. Factors that may increase risk of adverse event in presence of aspiration Rubye Oaks & Clearance Coots 2021): Factors that may increase risk of adverse event in presence of aspiration Rubye Oaks & Clearance Coots 2021): Reduced cognitive function; Dependence for feeding and/or oral hygiene Recommendations/Plan: Swallowing Evaluation Recommendations Swallowing Evaluation Recommendations Recommendations: NPO; Free water protocol after oral care; Ice chips PRN after oral care Liquid Administration via: Cup; No straw Medication Administration:  Via alternative means  Supervision: Full assist for feeding Oral care recommendations: Oral care QID (4x/day); Oral care before ice chips/water Treatment Plan Treatment Plan Treatment recommendations: Therapy as outlined in treatment plan below Follow-up recommendations: Follow physicians's recommendations for discharge plan and follow up therapies Functional status assessment: Patient has had a recent decline in their functional status and demonstrates the ability to make significant improvements in function in a reasonable and predictable amount of time. Treatment frequency: Min 2x/week Treatment duration: 1 week Interventions: Trials of upgraded texture/liquids; Patient/family education Recommendations Recommendations for follow up therapy are one component of a multi-disciplinary discharge planning process, led by the attending physician.  Recommendations may be updated based on patient status, additional functional criteria and insurance authorization. Assessment: Orofacial Exam: Orofacial Exam Oral Cavity: Oral Hygiene: WFL Oral Cavity - Dentition: Adequate natural dentition Orofacial Anatomy: WFL Anatomy: Anatomy: Suspected cervical osteophytes Boluses Administered: Boluses Administered Boluses Administered: Thin liquids (Level 0); Mildly thick liquids (Level 2, nectar thick); Puree  Oral Impairment Domain: Oral Impairment Domain Lip Closure: No labial escape Tongue control during bolus hold: Not tested Bolus transport/lingual motion: Slow tongue motion Oral residue: Residue collection on oral structures Location of oral residue : Tongue; Palate Initiation of pharyngeal swallow : Pyriform sinuses  Pharyngeal Impairment Domain: Pharyngeal Impairment Domain Soft palate elevation: No bolus between soft palate (SP)/pharyngeal wall (PW) Laryngeal elevation: Minimal superior movement of thyroid cartilage with minimal approximation of arytenoids to epiglottic petiole Anterior hyoid excursion: No anterior movement Epiglottic movement: No  inversion Laryngeal vestibule closure: Incomplete, narrow column air/contrast in laryngeal vestibule Pharyngeal stripping wave : Present - diminished Pharyngeal contraction (A/P view only): N/A Pharyngoesophageal segment opening: Complete distension and complete duration, no obstruction of flow Tongue base retraction: Trace column of contrast or air between tongue base and PPW Pharyngeal residue: Majority of contrast within or on pharyngeal structures Location of pharyngeal residue: Valleculae; Pharyngeal wall  Esophageal Impairment Domain: Esophageal Impairment Domain Esophageal clearance upright position: Complete clearance, esophageal coating Pill: No data recorded Penetration/Aspiration Scale Score: Penetration/Aspiration Scale Score 1.  Material does not enter airway: Mildly thick liquids (Level 2, nectar thick); Puree; Thin liquids (Level 0) Compensatory Strategies: Compensatory Strategies Compensatory strategies: Yes Posterior head tilt: Ineffective Ineffective Posterior head tilt: Puree   General Information: Caregiver present: No  Diet Prior to this Study: NPO   No data recorded  Respiratory Status: WFL   Supplemental O2: None (Room air)   History of Recent Intubation: No  Behavior/Cognition: Lethargic/Drowsy; Alert; Cooperative; Requires cueing Self-Feeding Abilities: Dependent for feeding Baseline vocal quality/speech: Not observed Volitional Cough: Unable to elicit Volitional Swallow: Unable to elicit Exam Limitations: No limitations Goal Planning: Prognosis for improved oropharyngeal function: Guarded Barriers to Reach Goals: Cognitive deficits; Severity of deficits No data recorded No data recorded Consulted and agree with results and recommendations: Pt unable/family or caregiver not available Pain: Pain Assessment Pain Assessment: PAINAD Breathing: 0 Negative Vocalization: 0 Facial Expression: 0 Body Language: 0 Consolability: 0 PAINAD Score: 0 End of Session: Start Time:SLP Start Time (ACUTE ONLY):  1305 Stop Time: SLP Stop Time (ACUTE ONLY): 1325 Time Calculation:SLP Time Calculation (min) (ACUTE ONLY): 20 min Charges: SLP Evaluations $ SLP Speech Visit: 1 Visit SLP Evaluations $BSS Swallow: 1 Procedure $MBS Swallow: 1 Procedure SLP visit diagnosis: SLP Visit Diagnosis: Dysphagia, oropharyngeal phase (R13.12) Past Medical History: Past Medical History: Diagnosis Date  Alzheimer disease (HCC)   COVID   Hypercholesteremia   Hypertension   Seizures (HCC)  Past Surgical History: Past  Surgical History: Procedure Laterality Date  KNEE SURGERY    MANDIBLE SURGERY   Angela Nevin, MA, CCC-SLP Speech Therapy        Scheduled Meds:  budesonide (PULMICORT) nebulizer solution  0.25 mg Nebulization BID   donepezil  10 mg Oral QHS   enoxaparin (LOVENOX) injection  60 mg Subcutaneous Q12H   pantoprazole (PROTONIX) IV  40 mg Intravenous Q12H   sertraline  50 mg Oral Daily   thiamine (VITAMIN B1) injection  100 mg Intravenous Daily   triamcinolone 0.1 % cream : eucerin  1 Application Topical BID   Continuous Infusions:  azithromycin 500 mg (03/23/23 0838)   cefTRIAXone (ROCEPHIN)  IV 2 g (03/23/23 0954)   dextrose 75 mL/hr at 03/23/23 0836   lacosamide (VIMPAT) IV 50 mg (03/23/23 1036)     LOS: 5 days    Time spent: 35 minutes    Cory Kitt A Golda Zavalza, MD [facility]   If 7PM-7AM, please contact night-coverage [email or web address]  03/23/2023, 2:10 PM

## 2023-03-23 NOTE — TOC Initial Note (Signed)
 Transition of Care [facility]) - Initial/Assessment Note    Patient Details  Name: Jonathan Berry MRN: [id number] Date of Birth: 11/29/1945  Transition of Care [facility]) CM/SW Contact:    Lorri Frederick, LCSW Phone Number: 03/23/2023, 2:25 PM  Clinical Narrative:     Pt oriented x0, CSW spoke with pt wife [place] in room. She confirms pt is from [facility] [employer] care unit.  Discussed PT recommending HH at [facility] and [place] is in agreement with this plan.  Daphane Shepherd is contact at [employer].    CSW LM with Daphane Shepherd.                Expected Discharge Plan: Memory Care Barriers to Discharge: Continued Medical Work up   Patient Goals and CMS Choice     Choice offered to / list presented to : Spouse (wife [place])      Expected Discharge Plan and Services In-house Referral: Clinical Social Work   Post Acute Care Choice: Resumption of Paediatric nurse ([facility] memory care) Living arrangements for the past 2 months: Assisted Living Facility                                      Prior Living Arrangements/Services Living arrangements for the past 2 months: Assisted Living Facility Lives with:: Facility Resident Patient language and need for interpreter reviewed:: No        Need for Family Participation in Patient Care: Yes (Comment) Care giver support system in place?: Yes (comment) Current home services: Other (comment) (na) Criminal Activity/Legal Involvement Pertinent to Current Situation/Hospitalization: No - Comment as needed  Activities of Daily Living      Permission Sought/Granted                  Emotional Assessment Appearance:: Appears stated age Attitude/Demeanor/Rapport: Unable to Assess Affect (typically observed): Unable to Assess Orientation: :  (not oriented)      Admission diagnosis:  Hypernatremia [E87.0] Fever [R50.9] Sepsis without acute organ dysfunction, due to unspecified organism [facility]) [A41.9] Patient Active  Problem List   Diagnosis Date Noted   Fever 03/18/2023   Sepsis (HCC) 03/18/2023   Hypernatremia 03/18/2023   Alzheimer disease (HCC)    Seizure (HCC) 01/15/2021   Protein-calorie malnutrition, severe 01/08/2021   Pulmonary emboli (HCC) 01/06/2021   Pneumonia due to COVID-19 virus 04/25/2020   ARF (acute renal failure) (HCC) 04/25/2020   DNR (do not resuscitate) 04/25/2020   Essential hypertension 04/25/2020   PCP:  Collene Mares, PA Pharmacy:  No Pharmacies Listed    Social Drivers of Health (SDOH) Social History: SDOH Screenings   Food Insecurity: Patient Unable To Answer (03/19/2023)  Housing: Patient Unable To Answer (03/19/2023)  Transportation Needs: Patient Unable To Answer (03/19/2023)  Utilities: Patient Unable To Answer (03/19/2023)  Financial Resource Strain: Low Risk  (09/24/2019)   Received from [facility], [facility]  Physical Activity: Sufficiently Active (09/24/2019)   Received from [facility], [facility]  Social Connections: Patient Unable To Answer (03/19/2023)  Stress: No Stress Concern Present (09/24/2019)   Received from [facility], [facility]  Tobacco Use: Low Risk  (10/06/2022)   SDOH Interventions:     Readmission Risk Interventions     No data to display

## 2023-03-23 NOTE — [place] FL2 (Signed)
 [place] MEDICAID FL2 LEVEL OF CARE FORM     IDENTIFICATION  Patient Name: Jonathan Berry Birthdate: [birth year] Sex: male Admission Date (Current Location): 03/18/2023  [facility] and [place] Number:  Producer, television/film/video and Address:  [facility], [address] [place], [place] [address]      Provider Number: [id number]  Attending Physician Name and Address:  Alba Cory, MD  Relative Name and Phone Number:  Speights,Virgina Spouse [phone]  [phone]    Current Level of Care: Hospital Recommended Level of Care: Memory Care ([facility]) Prior Approval Number:    Date Approved/Denied:   PASRR Number:    Discharge Plan: Other (Comment) ([facility] memory care unit)    Current Diagnoses: Patient Active Problem List   Diagnosis Date Noted   Fever 03/18/2023   Sepsis (HCC) 03/18/2023   Hypernatremia 03/18/2023   Alzheimer disease (HCC)    Seizure (HCC) 01/15/2021   Protein-calorie malnutrition, severe 01/08/2021   Pulmonary emboli (HCC) 01/06/2021   Pneumonia due to COVID-19 virus 04/25/2020   ARF (acute renal failure) (HCC) 04/25/2020   DNR (do not resuscitate) 04/25/2020   Essential hypertension 04/25/2020    Orientation RESPIRATION BLADDER Height & Weight      (not oriented)  Normal Incontinent Weight: 145 lb 4.5 oz (65.9 kg) Height:  5\' 7"  (170.2 cm)  BEHAVIORAL SYMPTOMS/MOOD NEUROLOGICAL BOWEL NUTRITION STATUS    Convulsions/Seizures Incontinent Diet (Dysphagia 1, nectar thick)  AMBULATORY STATUS COMMUNICATION OF NEEDS Skin   Limited Assist Does not communicate Skin abrasions, Other (Comment) (redness)                       Personal Care Assistance Level of Assistance  Bathing, Feeding, Dressing Bathing Assistance: Limited assistance Feeding assistance: Limited assistance Dressing Assistance: Limited assistance     Functional Limitations Info  Sight, Hearing, Speech Sight Info: Adequate Hearing Info:  Adequate Speech Info: Adequate    SPECIAL CARE FACTORS FREQUENCY  PT (By licensed PT), OT (By licensed OT)     PT Frequency: evaluate and treat OT Frequency: evaluate and treat            Contractures Contractures Info: Not present    Additional Factors Info  Code Status, Allergies Code Status Info: DNR Allergies Info: NKA           Current Medications (03/23/2023):  This is the current hospital active medication list Current Facility-Administered Medications  Medication Dose Route Frequency Provider Last Rate Last Admin   acetaminophen (TYLENOL) tablet 650 mg  650 mg Oral Q6H PRN Gertha Calkin, MD       Or   acetaminophen (TYLENOL) suppository 650 mg  650 mg Rectal Q6H PRN Gertha Calkin, MD   650 mg at 03/20/23 2015   albuterol (PROVENTIL) (2.5 MG/3ML) 0.083% nebulizer solution 2.5 mg  2.5 mg Nebulization Q2H PRN Regalado, Belkys A, MD   2.5 mg at 03/20/23 1518   azithromycin (ZITHROMAX) 500 mg in sodium chloride 0.9 % 250 mL IVPB  500 mg Intravenous Q24H Regalado, Belkys A, MD 250 mL/hr at 03/23/23 0838 500 mg at 03/23/23 0838   budesonide (PULMICORT) nebulizer solution 0.25 mg  0.25 mg Nebulization BID Regalado, Belkys A, MD   0.25 mg at 03/23/23 0811   cefTRIAXone (ROCEPHIN) 2 g in sodium chloride 0.9 % 100 mL IVPB  2 g Intravenous Daily Regalado, Belkys A, MD 200 mL/hr at 03/23/23 0954 2 g at 03/23/23 0954   dextrose  5 % solution   Intravenous Continuous Regalado, Belkys A, MD 75 mL/hr at 03/23/23 0836 New Bag at 03/23/23 0836   donepezil (ARICEPT) tablet 10 mg  10 mg Oral QHS Regalado, Belkys A, MD   10 mg at 03/22/23 2216   enoxaparin (LOVENOX) injection 60 mg  60 mg Subcutaneous Q12H Leander Rams, RPH   60 mg at 03/23/23 0865   hydrALAZINE (APRESOLINE) injection 10 mg  10 mg Intravenous Q6H PRN Gertha Calkin, MD   10 mg at 03/20/23 2227   lacosamide (VIMPAT) 50 mg in sodium chloride 0.9 % 25 mL IVPB  50 mg Intravenous Q12H Regalado, Belkys A, MD 60 mL/hr at 03/23/23  1036 50 mg at 03/23/23 1036   LORazepam (ATIVAN) injection 0.5 mg  0.5 mg Intravenous Q6H PRN Regalado, Belkys A, MD   0.5 mg at 03/20/23 1557   morphine (PF) 2 MG/ML injection 1 mg  1 mg Intravenous Q3H PRN Anthoney Harada, NP   1 mg at 03/20/23 2338   pantoprazole (PROTONIX) injection 40 mg  40 mg Intravenous Q12H Irena Cords V, MD   40 mg at 03/23/23 0955   sertraline (ZOLOFT) tablet 50 mg  50 mg Oral Daily Regalado, Belkys A, MD   50 mg at 03/20/23 1115   thiamine (VITAMIN B1) injection 100 mg  100 mg Intravenous Daily Irena Cords V, MD   100 mg at 03/23/23 0955   triamcinolone 0.1 % cream : eucerin cream, 1:1 1 Application  1 Application Topical BID Regalado, Belkys A, MD   1 Application at 03/23/23 1003     Discharge Medications: Please see discharge summary for a list of discharge medications.  Relevant Imaging Results:  Relevant Lab Results:   Additional Information SSN: [id number]  Lorri Frederick, LCSW

## 2023-03-23 NOTE — Progress Notes (Addendum)
 Speech Language Pathology Treatment: Dysphagia  Patient Details Name: Jonathan Berry MRN: [id number] DOB: 1945-06-01 Today's Date: 03/23/2023 Time: 4098-1191 SLP Time Calculation (min) (ACUTE ONLY): 13 min  Assessment / Plan / Recommendation Clinical Impression  Pt sitting upright in the chair, accepting trials of purees from his wife. She reports no overt difficulty with breakfast this morning. No coughing was observed when pt fed himself straw sips of thin and nectar thick liquids. Discussed continuing current diet for now but allowing sips of water/ice chips between meal times after oral care. Provided education regarding importance of oral care and the potential for ongoing dysphagia even after acute exacerbation has resolved given history of Alzheimer's dementia. Continue current diet of Dys 1 solids with nectar thick liquids. Sign posted at [facility]. Will continue following.    HPI HPI: 78yo male admitted from [facility] memory care 03/18/23 with fever, decreased PO intake. PMH: seizures, HLD, dementia - nonverbal, PE. CXR negative for PNA. Pt had an event on 3/7 where he threw up his food and aspirated his vomit.      SLP Plan  Continue with current plan of care      Recommendations for follow up therapy are one component of a multi-disciplinary discharge planning process, led by the attending physician.  Recommendations may be updated based on patient status, additional functional criteria and insurance authorization.    Recommendations  Diet recommendations: Dysphagia 1 (puree);Nectar-thick liquid Liquids provided via: Cup;Straw Medication Administration: Crushed with puree Supervision: Full supervision/cueing for compensatory strategies;Staff to assist with self feeding Compensations: Minimize environmental distractions;Slow rate;Small sips/bites Postural Changes and/or Swallow Maneuvers: Seated upright 90 degrees;Upright 30-60 min after meal                  Oral care  BID;Staff/trained caregiver to provide oral care   Frequent or constant Supervision/Assistance Dysphagia, oropharyngeal phase (R13.12)     Continue with current plan of care     Gwynneth Aliment, M.A., CF-SLP Speech Language Pathology, Acute Rehabilitation Services  Secure Chat preferred [phone]   03/23/2023, 1:28 PM

## 2023-03-24 DIAGNOSIS — A419 Sepsis, unspecified organism: Secondary | ICD-10-CM

## 2023-03-24 MED ORDER — LACOSAMIDE 50 MG PO TABS: 50.0000 mg | ORAL_TABLET | Freq: Two times a day (BID) | ORAL | Status: DC

## 2023-03-24 MED ORDER — THIAMINE MONONITRATE 100 MG PO TABS: 100.0000 mg | ORAL_TABLET | Freq: Every day | ORAL | Status: DC

## 2023-03-24 MED ORDER — LORAZEPAM 1 MG PO TABS: 0.5000 mg | ORAL_TABLET | Freq: Two times a day (BID) | ORAL | 0 refills | Status: AC | PRN

## 2023-03-24 MED ORDER — APIXABAN 5 MG PO TABS: 5.0000 mg | ORAL_TABLET | Freq: Two times a day (BID) | ORAL | Status: DC

## 2023-03-24 NOTE — TOC Transition Note (Signed)
 Transition of Care [facility]) - Discharge Note   Patient Details  Name: Jonathan Berry MRN: [id number] Date of Birth: [birth year]  [facility] CM/SW Contact:  Lorri Frederick, LCSW Phone Number: 03/24/2023, 1:39 PM   Clinical Narrative:   Pt discharging to [facility] memory care.  RN call Daphane Shepherd at [phone] for report.     Final next level of care: Memory Care Barriers to Discharge: Barriers Resolved   Patient Goals and CMS Choice     Choice offered to / list presented to : Spouse (wife Jonathan Berry)      Discharge Placement              Patient chooses bed at:  [facility] memory care) Patient to be transferred to facility by: ptar Name of family member notified: wife Jonathan Berry in room Patient and family notified of of transfer: 03/24/23  Discharge Plan and Services Additional resources added to the After Visit Summary for   In-house Referral: Clinical Social Work   Post Acute Care Choice: Resumption of Svcs/PTA Provider ([facility] memory care)                               Social Drivers of Health (SDOH) Interventions SDOH Screenings   Food Insecurity: Patient Unable To Answer (03/19/2023)  Housing: Patient Unable To Answer (03/19/2023)  Transportation Needs: Patient Unable To Answer (03/19/2023)  Utilities: Patient Unable To Answer (03/19/2023)  Financial Resource Strain: Low Risk  (09/24/2019)   Received from [facility], [facility]  Physical Activity: Sufficiently Active (09/24/2019)   Received from [facility], [facility]  Social Connections: Patient Unable To Answer (03/19/2023)  Stress: No Stress Concern Present (09/24/2019)   Received from [facility], [facility]  Tobacco Use: Low Risk  (10/06/2022)     Readmission Risk Interventions     No data to display

## 2023-03-24 NOTE — TOC Progression Note (Signed)
 Transition of Care [name]) - Progression Note    Patient Details  Name: Jonathan Berry MRN: [id number] Date of Birth: 08-Mar-1945  [facility]) CM/SW Contact  Lorri Frederick, LCSW Phone Number: 03/24/2023, 1:36 PM  Clinical Narrative:   CSW spoke with Daphane Shepherd at [facility], who confirmed they can receive pt back to memory care with [facility] orders.  CSW emailed FL2: [email or web address].  MD informed.   1300: DC Summary emailed, along with HH orders, FF.    Per PT, pt does require ambulance transport.    Expected Discharge Plan: Memory Care Barriers to Discharge: Continued Medical Work up  Expected Discharge Plan and Services In-house Referral: Clinical Social Work   Post Acute Care Choice: Resumption of Svcs/PTA Provider ([facility] memory care) Living arrangements for the past 2 months: Assisted Living Facility Expected Discharge Date: 03/24/23                                     Social Determinants of Health (SDOH) Interventions SDOH Screenings   Food Insecurity: Patient Unable To Answer (03/19/2023)  Housing: Patient Unable To Answer (03/19/2023)  Transportation Needs: Patient Unable To Answer (03/19/2023)  Utilities: Patient Unable To Answer (03/19/2023)  Financial Resource Strain: Low Risk  (09/24/2019)   Received from [facility], [facility]  Physical Activity: Sufficiently Active (09/24/2019)   Received from [facility], [facility]  Social Connections: Patient Unable To Answer (03/19/2023)  Stress: No Stress Concern Present (09/24/2019)   Received from [facility], [facility]  Tobacco Use: Low Risk  (10/06/2022)    Readmission Risk Interventions     No data to display

## 2023-03-24 NOTE — Evaluation (Signed)
 Occupational Therapy Evaluation Patient Details Name: Jonathan Berry MRN: [id number] DOB: 01/22/1945 Today's Date: 03/24/2023   History of Present Illness   78 y.o. male presents to [facility] hospital on 03/18/2023 with fever, poor PO intake, recent noro virus outbreak at the facility where he resides. PMH includes seizure, dementia, PE, nonverbal at baseline.     Clinical Impressions PTA patient needing assist for ADLs, mobility but able to self feed per spouse.  Resides at memory care, [facility].  Spouse reports he was able to feed himself yesterday, requires max assist +2 for bed mobility and mod assist +2 to stand at EOB.  Posterior lean in sitting and standing, overall remains at baseline for ADLs.  No further OT needs identified at this time. OT will sign off.  Thank you for this referral.      If plan is discharge home, recommend the following:   Supervision due to cognitive status;Two people to help with walking and/or transfers;Two people to help with bathing/dressing/bathroom     Functional Status Assessment         Equipment Recommendations   None recommended by OT     Recommendations for Other Services         Precautions/Restrictions   Precautions Precautions: Fall Recall of Precautions/Restrictions: Impaired Precaution/Restrictions Comments: posterior lean, nonverbal Restrictions Weight Bearing Restrictions Per Provider Order: No     Mobility Bed Mobility Overal bed mobility: Needs Assistance Bed Mobility: Supine to Sit, Sit to Supine     Supine to sit: Max assist, +2 for physical assistance Sit to supine: Total assist, +2 for physical assistance        Transfers Overall transfer level: Needs assistance Equipment used: 2 person hand held assist Transfers: Sit to/from Stand Sit to Stand: Mod assist, +2 physical assistance           General transfer comment: posterior lean in standing, unable to initate side steps      Balance Overall  balance assessment: Needs assistance Sitting-balance support: No upper extremity supported, Feet supported Sitting balance-Leahy Scale: Poor Sitting balance - Comments: at least min assist, posterior lean Postural control: Posterior lean Standing balance support: Bilateral upper extremity supported Standing balance-Leahy Scale: Poor Standing balance comment: modA due to posterior lean                           ADL either performed or assessed with clinical judgement   ADL Overall ADL's : At baseline                                       General ADL Comments: pt appears to be at baseline for ADLs, needs +2 for transfers but typically using pull ups for toileting needs     Vision   Vision Assessment?: No apparent visual deficits     Perception         Praxis         Pertinent Vitals/Pain Pain Assessment Pain Assessment: PAINAD Breathing: normal Negative Vocalization: none Facial Expression: smiling or inexpressive Body Language: relaxed Consolability: no need to console PAINAD Score: 0     Extremity/Trunk Assessment Upper Extremity Assessment Upper Extremity Assessment: Generalized weakness   Lower Extremity Assessment Lower Extremity Assessment: Defer to PT evaluation   Cervical / Trunk Assessment Cervical / Trunk Assessment: Normal   Communication Communication Communication: Impaired Factors Affecting Communication: Difficulty expressing  self   Cognition Arousal: Alert Behavior During Therapy: Flat affect Cognition: History of cognitive impairments             OT - Cognition Comments: pt with hx of cogntive deficits, intermittetly follows 1 step commands but not consistently; very limited verbalizations                 Following commands: Impaired Following commands impaired: Follows one step commands inconsistently     Cueing  General Comments   Cueing Techniques: Verbal cues;Gestural cues;Visual cues;Tactile  cues  VSS on RA; spouse present and supportive   Exercises     Shoulder Instructions      Home Living Family/patient expects to be discharged to:: Other (Comment)                                 Additional Comments: return to Memory care at [facility]      Prior Functioning/Environment Prior Level of Function : Needs assist  Cognitive Assist : Mobility (cognitive);ADLs (cognitive) Mobility (Cognitive): Step by step cues ADLs (Cognitive): Step by step cues Physical Assist : Mobility (physical);ADLs (physical) Mobility (physical): Bed mobility;Transfers;Gait ADLs (physical): Grooming;Bathing;Toileting;Dressing;IADLs Mobility Comments: spouse reports the pt requires assistance for bed mobility and transfers, also able to ambulate with staff assistance. does not utilize DME ADLs Comments: spouse reports pt able to self feed after guidance, total care for all other ADLs    OT Problem List: Decreased strength;Decreased activity tolerance   OT Treatment/Interventions:        OT Goals(Current goals can be found in the care plan section)   Acute Rehab OT Goals Patient Stated Goal: back to memory care OT Goal Formulation: With family   OT Frequency:       Co-evaluation              AM-PAC OT "6 Clicks" Daily Activity     Outcome Measure Help from another person eating meals?: A Little Help from another person taking care of personal grooming?: Total Help from another person toileting, which includes using toliet, bedpan, or urinal?: Total Help from another person bathing (including washing, rinsing, drying)?: Total Help from another person to put on and taking off regular upper body clothing?: Total Help from another person to put on and taking off regular lower body clothing?: Total 6 Click Score: 8   End of Session Nurse Communication: Mobility status  Activity Tolerance: Patient tolerated treatment well Patient left: in bed;with call bell/phone  within reach;with bed alarm set;with family/visitor present  OT Visit Diagnosis: Other abnormalities of gait and mobility (R26.89);Muscle weakness (generalized) (M62.81)                Time: 0981-1914 OT Time Calculation (min): 17 min Charges:  OT General Charges $OT Visit: 1 Visit OT Evaluation $OT Eval Low Complexity: 1 Low  Barry Brunner, OT Acute Rehabilitation Services Office [phone] Secure Chat Preferred    Jonathan Berry 03/24/2023, 11:41 AM

## 2023-03-24 NOTE — Plan of Care (Signed)
   Problem: Fluid Volume: Goal: Hemodynamic stability will improve Outcome: Progressing   Problem: Clinical Measurements: Goal: Diagnostic test results will improve Outcome: Progressing Goal: Signs and symptoms of infection will decrease Outcome: Progressing   Problem: Respiratory: Goal: Ability to maintain adequate ventilation will improve Outcome: Progressing

## 2023-03-24 NOTE — Discharge Summary (Signed)
 Physician Discharge Summary   Patient: Jonathan Berry MRN: [id number] DOB: 04/21/1945  Admit date:     03/18/2023  Discharge date: 03/24/23  Discharge Physician: Alba Cory   PCP: Collene Mares, PA   Recommendations at discharge:    Palliative care follow up , further goals of care.   Discharge Diagnoses: Principal Problem:   Fever Active Problems:   DNR (do not resuscitate)   Essential hypertension   Pulmonary emboli (HCC)   Seizure (HCC)   Alzheimer disease (HCC)   Sepsis (HCC)   Hypernatremia  Resolved Problems:   * No resolved hospital problems. [facility] Course: 78 year old with past medical history significant for seizure, dementia, pulmonary embolism and PE December 2022 presented via EMS from memory care facility due to fever, poor oral intake, patient is nonverbal at baseline. Admission was requested for sepsis. Per wife patient is nonverbal at baseline, say occasional words. Norovirus outbreak at his facility. Patient had diarrhea and fever was sent for further evaluation.   Assessment and Plan: 1-Gastroenteritis secondary to Campylobacter and norovirus Fever, HO of Diarrhea;  -Facility with Norovirus outbreak.  -GI pathogen: Campylobacter and norovirus and C diff negative -Urinalysis negative , urine culture P -Respiratory panel negative -Influenza A, B and RSV negative -Chest x-ray: No active disease He vomited multiple times in the hospital,  KUB obtained negative for obstruction Continue with supportive care Started azithromycin for Campylobacter Completed tx with azithromycin.    Hypernatremia, dehydration: -In the setting of poor oral intake dehydration -Continue with IV fluids D5     Sepsis: In the setting of gastroenteritis, norovirus and Campylobacter -Presents with fever temperature 101, respiration rate 27, tachycardia -No source of infection identified at this point but there is possible norovirus outbreak at his facility -Continue  with IV fluids and IV antibiotics -Chest X-ray no active disease -GI pathogen Campylobacter, Norovirus  , C diff negative  -X-ray bilateral bases atelectasis -Stop Vancomycin and Flagyl.  -Continue with ceftriaxone.   Alzheimer  disease: -Resume Aricept.    Dysphagia:  High risk for aspiration, immobility of epiglottis leading to excessive pharyngeal residuals of PO's . Speech discussed with family, plan is for Dysphagia 1 thin liquids.  Palliative consulted goals of care   Seizure disorder -Continue with  Vimpat.    History of PE -Resume Eliquis.    1-2 Blood culture Positive for staph Hominis, likely contaminate.    Essential hypertension -PRN Hydralazine.  -not on meds prior to admission.      Aspiration; PNA Came back to see patient he develops worsening cough after drinking water and Vomiting.  -Albuterol given  -wean off oxygen.  -X-ray bilateral atelectasis -Completed IV ceftriaxone for 7 days.    Hypokalemia; replaced.      Estimated body mass index is 22.75 kg/m as calculated from the following:   Height as of this encounter: 5\' 7"  (1.702 m).   Weight as of this encounter: 65.9 kg.         Consultants: Palliative Procedures performed: none Disposition: Long term care facility Diet recommendation:  Dysphagia 1 nectar thick.  DISCHARGE MEDICATION: Allergies as of 03/24/2023   No Known Allergies      Medication List     TAKE these medications    acetaminophen 500 MG tablet Commonly known as: TYLENOL Take 500 mg by mouth every 6 (six) hours as needed for mild pain, moderate pain or fever.   apixaban 5 MG Tabs tablet Commonly known as: ELIQUIS Take 1 tablet (  5 mg total) by mouth 2 (two) times daily.   donepezil 10 MG tablet Commonly known as: ARICEPT Take 10 mg by mouth at bedtime.   lacosamide 50 MG Tabs tablet Commonly known as: Vimpat Take 1 tablet (50 mg total) by mouth 2 (two) times daily.   LORazepam 1 MG tablet Commonly known  as: ATIVAN Take 0.5 tablets (0.5 mg total) by mouth 2 (two) times daily as needed for anxiety.   Secura Protective 10 % Crea Generic drug: ZINC OXIDE (TOPICAL) Apply 1 Application topically daily as needed (excessive soiling).   sertraline 50 MG tablet Commonly known as: ZOLOFT Take 50 mg by mouth daily.   Systane 0.4-0.3 % Soln Generic drug: Polyethyl Glycol-Propyl Glycol Place 2 drops into both eyes 4 (four) times daily as needed (dry eyes/redness/irritation/discomfort).        Discharge Exam: Filed Weights   03/20/23 2038  Weight: 65.9 kg   General; Alert  Condition at discharge: stable  The results of significant diagnostics from this hospitalization (including imaging, microbiology, ancillary and laboratory) are listed below for reference.   Imaging Studies: DG Swallowing Func-Speech Pathology Result Date: 03/22/2023 Table formatting from the original result was not included. Images from the original result were not included. Modified Barium Swallow Study Patient Details Name: Jonathan Berry MRN: [id number] Date of Birth: 12-Mar-1945 Today's Date: 03/22/2023 HPI/PMH: HPI: 78yo male admitted from [facility] memory care 03/18/23 with fever, decreased PO intake. PMH: seizures, HLD, dementia - nonverbal, PE. CXR negative for PNA. Pt had an event on 3/7 where he threw up his food and aspirated his vomit. Clinical Impression: Clinical Impression: Patient presents with a mild oral and a mod-severe pharyngeal phase dysphagia as per this MBS. Barium consistencies of thin, nectar thick and puree were tested. Patient with minimal laryngeal elevation, no observed anterior hyoid excursion. During swallow, epiglottis came into contact with posterior pharyngeal wall and no epiglottic inversion was observed. This led to majority of liquid and solid boluses remaining in vallecular sinus s/p swallow with trace to minimal amount on posterior pharyngeal wall. Vallecular sinus remained full of barium  throughout the study. Airway did appear well protected with no instances of penetration or aspiration occuring. PES opening did appear adequate and no significant delays in transit of barium through PES or upper esophagus; no retrograde movement of barium above or below PES was observed. Collection of barium in vallecular sinus supports recent episode of emesis of food after eating as he is not able to effectively clear PO's in pharynx, leading to need to regurgitate and increased risk of aspiration. SLP recommending continue NPO status. Factors that may increase risk of adverse event in presence of aspiration Rubye Oaks & Clearance Coots 2021): Factors that may increase risk of adverse event in presence of aspiration Rubye Oaks & Clearance Coots 2021): Reduced cognitive function; Dependence for feeding and/or oral hygiene Recommendations/Plan: Swallowing Evaluation Recommendations Swallowing Evaluation Recommendations Recommendations: NPO; Free water protocol after oral care; Ice chips PRN after oral care Liquid Administration via: Cup; No straw Medication Administration: Via alternative means Supervision: Full assist for feeding Oral care recommendations: Oral care QID (4x/day); Oral care before ice chips/water Treatment Plan Treatment Plan Treatment recommendations: Therapy as outlined in treatment plan below Follow-up recommendations: Follow physicians's recommendations for discharge plan and follow up therapies Functional status assessment: Patient has had a recent decline in their functional status and demonstrates the ability to make significant improvements in function in a reasonable and predictable amount of time. Treatment frequency: Min 2x/week Treatment duration:  1 week Interventions: Trials of upgraded texture/liquids; Patient/family education Recommendations Recommendations for follow up therapy are one component of a multi-disciplinary discharge planning process, led by the attending physician.  Recommendations may be  updated based on patient status, additional functional criteria and insurance authorization. Assessment: Orofacial Exam: Orofacial Exam Oral Cavity: Oral Hygiene: WFL Oral Cavity - Dentition: Adequate natural dentition Orofacial Anatomy: WFL Anatomy: Anatomy: Suspected cervical osteophytes Boluses Administered: Boluses Administered Boluses Administered: Thin liquids (Level 0); Mildly thick liquids (Level 2, nectar thick); Puree  Oral Impairment Domain: Oral Impairment Domain Lip Closure: No labial escape Tongue control during bolus hold: Not tested Bolus transport/lingual motion: Slow tongue motion Oral residue: Residue collection on oral structures Location of oral residue : Tongue; Palate Initiation of pharyngeal swallow : Pyriform sinuses  Pharyngeal Impairment Domain: Pharyngeal Impairment Domain Soft palate elevation: No bolus between soft palate (SP)/pharyngeal wall (PW) Laryngeal elevation: Minimal superior movement of thyroid cartilage with minimal approximation of arytenoids to epiglottic petiole Anterior hyoid excursion: No anterior movement Epiglottic movement: No inversion Laryngeal vestibule closure: Incomplete, narrow column air/contrast in laryngeal vestibule Pharyngeal stripping wave : Present - diminished Pharyngeal contraction (A/P view only): N/A Pharyngoesophageal segment opening: Complete distension and complete duration, no obstruction of flow Tongue base retraction: Trace column of contrast or air between tongue base and PPW Pharyngeal residue: Majority of contrast within or on pharyngeal structures Location of pharyngeal residue: Valleculae; Pharyngeal wall  Esophageal Impairment Domain: Esophageal Impairment Domain Esophageal clearance upright position: Complete clearance, esophageal coating Pill: No data recorded Penetration/Aspiration Scale Score: Penetration/Aspiration Scale Score 1.  Material does not enter airway: Mildly thick liquids (Level 2, nectar thick); Puree; Thin liquids (Level  0) Compensatory Strategies: Compensatory Strategies Compensatory strategies: Yes Posterior head tilt: Ineffective Ineffective Posterior head tilt: Puree   General Information: Caregiver present: No  Diet Prior to this Study: NPO   No data recorded  Respiratory Status: WFL   Supplemental O2: None (Room air)   History of Recent Intubation: No  Behavior/Cognition: Lethargic/Drowsy; Alert; Cooperative; Requires cueing Self-Feeding Abilities: Dependent for feeding Baseline vocal quality/speech: Not observed Volitional Cough: Unable to elicit Volitional Swallow: Unable to elicit Exam Limitations: No limitations Goal Planning: Prognosis for improved oropharyngeal function: Guarded Barriers to Reach Goals: Cognitive deficits; Severity of deficits No data recorded No data recorded Consulted and agree with results and recommendations: Pt unable/family or caregiver not available Pain: Pain Assessment Pain Assessment: PAINAD Breathing: 0 Negative Vocalization: 0 Facial Expression: 0 Body Language: 0 Consolability: 0 PAINAD Score: 0 End of Session: Start Time:SLP Start Time (ACUTE ONLY): 1305 Stop Time: SLP Stop Time (ACUTE ONLY): 1325 Time Calculation:SLP Time Calculation (min) (ACUTE ONLY): 20 min Charges: SLP Evaluations $ SLP Speech Visit: 1 Visit SLP Evaluations $BSS Swallow: 1 Procedure $MBS Swallow: 1 Procedure SLP visit diagnosis: SLP Visit Diagnosis: Dysphagia, oropharyngeal phase (R13.12) Past Medical History: Past Medical History: Diagnosis Date  Alzheimer disease (HCC)   COVID   Hypercholesteremia   Hypertension   Seizures (HCC)  Past Surgical History: Past Surgical History: Procedure Laterality Date  KNEE SURGERY    MANDIBLE SURGERY   Angela Nevin, MA, CCC-SLP Speech Therapy   DG Abd 1 View Result Date: 03/21/2023 CLINICAL DATA:  78 year old male with nausea and vomiting. EXAM: ABDOMEN - 1 VIEW COMPARISON:  Pelvis CT 09/15/2021.  Portable chest yesterday. FINDINGS: Supine AP view at 0906 hours. Extensive  Calcified aortic atherosclerosis. Negative lung bases. Scoliosis. Nonobstructed bowel gas pattern. Not all of the left abdomen is visible on  this single view. No acute osseous abnormality identified. IMPRESSION: Non obstructed visible bowel gas pattern. Aortic Atherosclerosis (ICD10-I70.0). Electronically Signed   By: Odessa Fleming M.D.   On: 03/21/2023 12:52   DG CHEST PORT 1 VIEW Result Date: 03/20/2023 CLINICAL DATA:  Cough. EXAM: PORTABLE CHEST 1 VIEW COMPARISON:  03/18/2023 FINDINGS: The cardiomediastinal contours are normal. Mild bibasilar atelectasis. Pulmonary vasculature is normal. No pleural effusion or pneumothorax. No acute osseous abnormalities are seen. IMPRESSION: Mild bibasilar atelectasis.  No confluent airspace disease. Electronically Signed   By: Narda Rutherford M.D.   On: 03/20/2023 18:33   CT HEAD WO CONTRAST ( ) Result Date: 03/18/2023 CLINICAL DATA:  Mental status change, unknown cause EXAM: CT HEAD WITHOUT CONTRAST TECHNIQUE: Contiguous axial images were obtained from the base of the skull through the vertex without intravenous contrast. RADIATION DOSE REDUCTION: This exam was performed according to the departmental dose-optimization program which includes automated exposure control, adjustment of the mA and/or kV according to patient size and/or use of iterative reconstruction technique. COMPARISON:  CT head 10/06/2022 FINDINGS: Brain: No intracranial hemorrhage, mass effect, or evidence of acute infarct. No hydrocephalus. No extra-axial fluid collection. Age advanced cerebral atrophy. Chronic small vessel ischemic disease. Vascular: No hyperdense vessel. Intracranial arterial calcification. Skull: No fracture or focal lesion. Sinuses/Orbits: No acute finding. Other: None. IMPRESSION: 1. No acute intracranial abnormality. 2. Age advanced cerebral atrophy. Electronically Signed   By: Minerva Fester M.D.   On: 03/18/2023 21:19   DG Chest Port 1 View Result Date: 03/18/2023 CLINICAL DATA:   Sepsis. EXAM: PORTABLE CHEST 1 VIEW COMPARISON:  09/15/2021 FINDINGS: The heart size and mediastinal contours are within normal limits. Both lungs are clear. The visualized skeletal structures are unremarkable. IMPRESSION: No active disease. Electronically Signed   By: Danae Orleans M.D.   On: 03/18/2023 18:07    Microbiology: Results for orders placed or performed during the hospital encounter of 03/18/23  Blood Culture (routine x 2)     Status: Abnormal   Collection Time: 03/18/23  3:08 PM   Specimen: BLOOD LEFT WRIST  Result Value Ref Range Status   Specimen Description BLOOD LEFT WRIST  Final   Special Requests   Final    BOTTLES DRAWN AEROBIC AND ANAEROBIC Blood Culture adequate volume   Culture  Setup Time   Final    GRAM POSITIVE COCCI IN CLUSTERS AEROBIC BOTTLE ONLY Organism ID to follow    Culture (A)  Final    STAPHYLOCOCCUS HOMINIS THE SIGNIFICANCE OF ISOLATING THIS ORGANISM FROM A SINGLE SET OF BLOOD CULTURES WHEN MULTIPLE SETS ARE DRAWN IS UNCERTAIN. PLEASE NOTIFY THE MICROBIOLOGY DEPARTMENT WITHIN ONE WEEK IF SPECIATION AND SENSITIVITIES ARE REQUIRED. Performed at [facility], [address], [place], [place] [address]    Report Status 03/20/2023 FINAL  Final  Blood Culture ID Panel (Reflexed)     Status: Abnormal   Collection Time: 03/18/23  3:08 PM  Result Value Ref Range Status   Enterococcus faecalis NOT DETECTED NOT DETECTED Final   Enterococcus Faecium NOT DETECTED NOT DETECTED Final   Listeria monocytogenes NOT DETECTED NOT DETECTED Final   Staphylococcus species DETECTED (A) NOT DETECTED Final    Comment: CRITICAL RESULT CALLED TO, READ BACK BY AND VERIFIED WITH: PHARMD L. Imogene Burn [id number] AT 1858, ADC    Staphylococcus aureus (BCID) NOT DETECTED NOT DETECTED Final   Staphylococcus epidermidis NOT DETECTED NOT DETECTED Final   Staphylococcus lugdunensis NOT DETECTED NOT DETECTED Final   Streptococcus species NOT DETECTED  NOT DETECTED Final   Streptococcus  agalactiae NOT DETECTED NOT DETECTED Final   Streptococcus pneumoniae NOT DETECTED NOT DETECTED Final   Streptococcus pyogenes NOT DETECTED NOT DETECTED Final   A.calcoaceticus-baumannii NOT DETECTED NOT DETECTED Final   Bacteroides fragilis NOT DETECTED NOT DETECTED Final   Enterobacterales NOT DETECTED NOT DETECTED Final   Enterobacter cloacae complex NOT DETECTED NOT DETECTED Final   Escherichia coli NOT DETECTED NOT DETECTED Final   Klebsiella aerogenes NOT DETECTED NOT DETECTED Final   Klebsiella oxytoca NOT DETECTED NOT DETECTED Final   Klebsiella pneumoniae NOT DETECTED NOT DETECTED Final   Proteus species NOT DETECTED NOT DETECTED Final   Salmonella species NOT DETECTED NOT DETECTED Final   Serratia marcescens NOT DETECTED NOT DETECTED Final   Haemophilus influenzae NOT DETECTED NOT DETECTED Final   Neisseria meningitidis NOT DETECTED NOT DETECTED Final   Pseudomonas aeruginosa NOT DETECTED NOT DETECTED Final   Stenotrophomonas maltophilia NOT DETECTED NOT DETECTED Final   Candida albicans NOT DETECTED NOT DETECTED Final   Candida auris NOT DETECTED NOT DETECTED Final   Candida glabrata NOT DETECTED NOT DETECTED Final   Candida krusei NOT DETECTED NOT DETECTED Final   Candida parapsilosis NOT DETECTED NOT DETECTED Final   Candida tropicalis NOT DETECTED NOT DETECTED Final   Cryptococcus neoformans/gattii NOT DETECTED NOT DETECTED Final    Comment: Performed at [facility], [address] [place], [place] [address]  Resp panel by RT-PCR (RSV, Flu A&B, Covid) Anterior Nasal Swab     Status: None   Collection Time: 03/18/23  3:11 PM   Specimen: Anterior Nasal Swab  Result Value Ref Range Status   SARS Coronavirus 2 by RT PCR NEGATIVE NEGATIVE Final   Influenza A by PCR NEGATIVE NEGATIVE Final   Influenza B by PCR NEGATIVE NEGATIVE Final    Comment: (NOTE) The Xpert Xpress SARS-CoV-2/FLU/RSV plus assay is intended as an aid in the diagnosis of influenza from  Nasopharyngeal swab specimens and should not be used as a sole basis for treatment. Nasal washings and aspirates are unacceptable for Xpert Xpress SARS-CoV-2/FLU/RSV testing.  Fact Sheet for Patients: BloggerCourse.com  Fact Sheet for Healthcare Providers: SeriousBroker.it  This test is not yet approved or cleared by the [place] FDA and has been authorized for detection and/or diagnosis of SARS-CoV-2 by FDA under an Emergency Use Authorization (EUA). This EUA will remain in effect (meaning this test can be used) for the duration of the COVID-19 declaration under Section 564(b)(1) of the Act, 21 U.S.C. section 360bbb-3(b)(1), unless the authorization is terminated or revoked.     Resp Syncytial Virus by PCR NEGATIVE NEGATIVE Final    Comment: (NOTE) Fact Sheet for Patients: BloggerCourse.com  Fact Sheet for Healthcare Providers: SeriousBroker.it  This test is not yet approved or cleared by the [place] FDA and has been authorized for detection and/or diagnosis of SARS-CoV-2 by FDA under an Emergency Use Authorization (EUA). This EUA will remain in effect (meaning this test can be used) for the duration of the COVID-19 declaration under Section 564(b)(1) of the Act, 21 U.S.C. section 360bbb-3(b)(1), unless the authorization is terminated or revoked.  Performed at [facility], [address] [place], [place] [address]   Blood Culture (routine x 2)     Status: None   Collection Time: 03/18/23  3:15 PM   Specimen: BLOOD RIGHT FOREARM  Result Value Ref Range Status   Specimen Description BLOOD RIGHT FOREARM  Final   Special Requests  Final    BOTTLES DRAWN AEROBIC AND ANAEROBIC Blood Culture adequate volume   Culture   Final    NO GROWTH 5 DAYS Performed at [facility], [address], [place], [place] [address]    Report Status 03/23/2023 FINAL   Final  Gastrointestinal Panel by PCR , Stool     Status: Abnormal   Collection Time: 03/18/23  7:11 PM   Specimen: Stool  Result Value Ref Range Status   Campylobacter species DETECTED (A) NOT DETECTED Final    Comment: CRITICAL RESULT CALLED TO, READ BACK BY AND VERIFIED WITH: Kathryne Hitch RN [facility] @ [phone] 03/21/23 lfd    Plesimonas shigelloides NOT DETECTED NOT DETECTED Final   Salmonella species NOT DETECTED NOT DETECTED Final   Yersinia enterocolitica NOT DETECTED NOT DETECTED Final   Vibrio species NOT DETECTED NOT DETECTED Final   Vibrio cholerae NOT DETECTED NOT DETECTED Final   Enteroaggregative E coli (EAEC) NOT DETECTED NOT DETECTED Final   Enteropathogenic E coli (EPEC) NOT DETECTED NOT DETECTED Final   Enterotoxigenic E coli (ETEC) NOT DETECTED NOT DETECTED Final   Shiga like toxin producing E coli (STEC) NOT DETECTED NOT DETECTED Final   Shigella/Enteroinvasive E coli (EIEC) NOT DETECTED NOT DETECTED Final   Cryptosporidium NOT DETECTED NOT DETECTED Final   Cyclospora cayetanensis NOT DETECTED NOT DETECTED Final   Entamoeba histolytica NOT DETECTED NOT DETECTED Final   Giardia lamblia NOT DETECTED NOT DETECTED Final   Adenovirus F40/41 NOT DETECTED NOT DETECTED Final   Astrovirus NOT DETECTED NOT DETECTED Final   Norovirus GI/GII DETECTED (A) NOT DETECTED Final    Comment: CRITICAL RESULT CALLED TO, READ BACK BY AND VERIFIED WITH: Kathryne Hitch RN [facility] @ [phone] 03/21/23 lfd    Rotavirus A NOT DETECTED NOT DETECTED Final   Sapovirus (I, II, IV, and V) NOT DETECTED NOT DETECTED Final    Comment: Performed at [facility], [address] [place], [place] [address]  Respiratory (~20 pathogens) panel by PCR     Status: None   Collection Time: 03/18/23  8:38 PM   Specimen: Nasopharyngeal Swab; Respiratory  Result Value Ref Range Status   Adenovirus NOT DETECTED NOT DETECTED Final   Coronavirus 229E NOT DETECTED NOT DETECTED Final    Comment: (NOTE) The  Coronavirus on the Respiratory Panel, DOES NOT test for the novel  Coronavirus (2019 nCoV)    Coronavirus HKU1 NOT DETECTED NOT DETECTED Final   Coronavirus NL63 NOT DETECTED NOT DETECTED Final   Coronavirus OC43 NOT DETECTED NOT DETECTED Final   Metapneumovirus NOT DETECTED NOT DETECTED Final   Rhinovirus / Enterovirus NOT DETECTED NOT DETECTED Final   Influenza A NOT DETECTED NOT DETECTED Final   Influenza B NOT DETECTED NOT DETECTED Final   Parainfluenza Virus 1 NOT DETECTED NOT DETECTED Final   Parainfluenza Virus 2 NOT DETECTED NOT DETECTED Final   Parainfluenza Virus 3 NOT DETECTED NOT DETECTED Final   Parainfluenza Virus 4 NOT DETECTED NOT DETECTED Final   Respiratory Syncytial Virus NOT DETECTED NOT DETECTED Final   Bordetella pertussis NOT DETECTED NOT DETECTED Final   Bordetella Parapertussis NOT DETECTED NOT DETECTED Final   Chlamydophila pneumoniae NOT DETECTED NOT DETECTED Final   Mycoplasma pneumoniae NOT DETECTED NOT DETECTED Final    Comment: Performed at [facility], [address], [place], [place] [address]  C Difficile Quick Screen w PCR reflex     Status: None   Collection Time: 03/19/23  8:47 AM  Specimen: STOOL  Result Value Ref Range Status   C Diff antigen NEGATIVE NEGATIVE Final   C Diff toxin NEGATIVE NEGATIVE Final   C Diff interpretation No C. difficile detected.  Final    Comment: Performed at [facility], [address], [place], [place] [address]  Urine Culture (for pregnant, neutropenic or urologic patients or patients with an indwelling urinary catheter)     Status: None   Collection Time: 03/19/23  8:49 AM   Specimen: Urine, Clean Catch  Result Value Ref Range Status   Specimen Description URINE, CLEAN CATCH  Final   Special Requests NONE  Final   Culture   Final    NO GROWTH Performed at [facility], [address] [place], [place] [address]    Report Status 03/21/2023 FINAL  Final    Labs: CBC: Recent Labs  Lab  03/18/23 1508 03/19/23 0548 03/20/23 1403 03/22/23 1004  WBC 10.1 10.6* 9.6 10.7*  NEUTROABS 7.6  --   --   --   HGB 15.6 13.7 13.6 11.0*  HCT 47.9 41.8 39.9 32.1*  MCV 88.7 87.8 86.6 84.9  PLT 269 243 233 206   Basic Metabolic Panel: Recent Labs  Lab 03/18/23 1508 03/19/23 0544 03/20/23 1403 03/22/23 1004 03/23/23 1512  NA 150* 145 140 136 139  K 4.2 4.1 4.3 3.2* 4.1  CL 108 113* 104 106 105  CO2 24 21* 26 25 26   GLUCOSE 108* 183* 108* 101* 97  BUN 28* 23 12 12  7*  CREATININE 1.08 0.97 0.83 0.82 0.82  CALCIUM 9.5 8.3* 8.6* 8.0* 8.5*  MG  --  2.0 1.9  --   --   PHOS  --  3.5  --   --   --    Liver Function Tests: Recent Labs  Lab 03/18/23 1508 03/19/23 0544  AST 87* 65*  ALT 36 32  ALKPHOS 60 53  BILITOT 0.8 1.0  PROT 7.1 5.9*  ALBUMIN 3.6 3.1*   CBG: Recent Labs  Lab 03/18/23 1952 03/20/23 1826  GLUCAP 145* 97    Discharge time spent: greater than 30 minutes.  Signed: Alba Cory, MD [facility] 03/24/2023

## 2023-03-24 NOTE — Progress Notes (Signed)
 PHARMACY - ANTICOAGULATION CONSULT NOTE  Pharmacy Consult for apixaban Indication: PE   No Known Allergies  Patient Measurements: Height: 5\' 7"  (170.2 cm) Weight: 65.9 kg (145 lb 4.5 oz) IBW/kg (Calculated) : 66.1   Vital Signs: Temp: 98 F (36.7 C) (03/11 0802) Temp Source: Oral (03/11 0415) BP: 154/103 (03/11 0802) Pulse Rate: 79 (03/11 0802)  Labs: Recent Labs    03/22/23 1004 03/23/23 1512  HGB 11.0*  --   HCT 32.1*  --   PLT 206  --   CREATININE 0.82 0.82    Estimated Creatinine Clearance: 70.3 mL/min (by C-G formula based on SCr of 0.82 mg/dL).   Medical History: Past Medical History:  Diagnosis Date   Alzheimer disease (HCC)    COVID    Hypercholesteremia    Hypertension    Seizures (HCC)      Assessment: 78 yo man living at memory care facility with high risk of aspiration was admitted for gastroenteritis. Patient was switched from heparin to enoxaparin due to refusing labs. Patient now able to swallow medications. Pharmacy consulted to restart home apixaban for hx of PE 2022.   Received enoxaparin this morning.   Goal of Therapy:  Monitor platelets by anticoagulation protocol: Yes   Plan:  Apixaban 5mg  BID to start tonight  Monitor signs/symptoms of bleeding     Alphia Moh, PharmD, BCPS, BCCP Clinical Pharmacist  Please check AMION for all [facility] Pharmacy phone numbers After 10:00 PM, call Main Pharmacy [phone]

## 2023-03-25 NOTE — Progress Notes (Signed)
      Consult received. Chart reviewed. Discussed with Dr. Sunnie Nielsen.   Patient is discharging today back to [facility] memory care.  I have recommended outpatient palliative referral - TOC is aware.     Sherlean Foot, NP-C Palliative Medicine   Please call Palliative Medicine team phone with any questions [phone]. For individual providers please see AMION.   No charge

## 2023-03-26 NOTE — Progress Notes (Signed)
   [facility] Liaison Note:  Notified by [facility] manager of patient/family request for [facility] Palliative services at home after discharge.   Please call with any hospice or outpatient palliative care related questions.   Thank you for the opportunity to participate in this patient's care.   Glenna Fellows, BSN, RN, OCN [employer] [phone]

## 2023-04-07 LAB — MISCELLANEOUS TEST

## 2023-07-05 IMAGING — DX DG CHEST 1V PORT
1 series · 1 of 1 positions shown · non-contrast
Comparison: Chest x-ray dated [DATE]

CLINICAL DATA: Altered mental status

EXAM:
PORTABLE CHEST 1 VIEW

[chest]
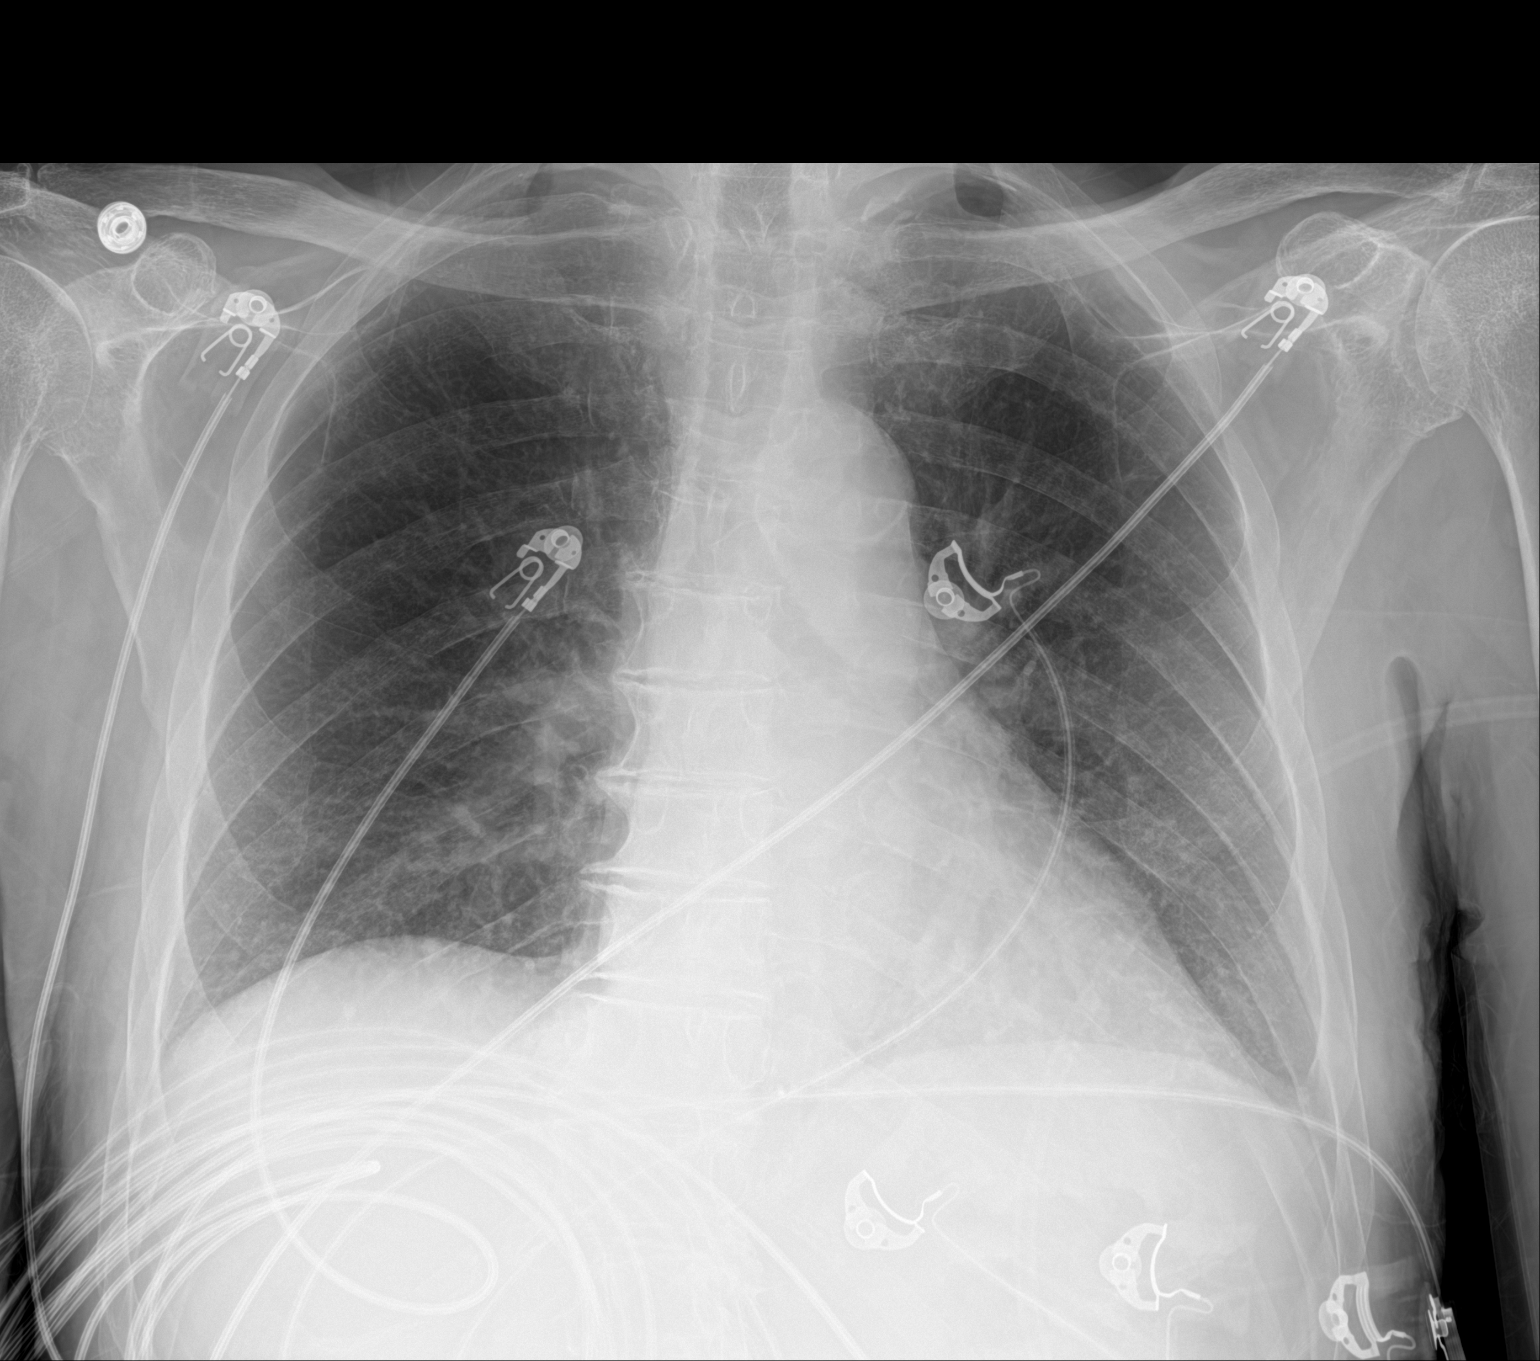

[1 of 1 positions shown; findings below may reference images not displayed]

FINDINGS: Cardiac and mediastinal contours are unchanged within normal limits.
Unchanged biapical pleuroparenchymal scarring and mild apical
pleural calcifications. Mild bibasilar atelectasis. No large pleural
effusion. No evidence of pneumothorax.
IMPRESSION: No acute cardiopulmonary abnormality.

## 2023-10-09 ENCOUNTER — Emergency Department (HOSPITAL_COMMUNITY)
Admission: EM | Admit: 2023-10-09 | Discharge: 2023-10-09 | Disposition: A | Discharge status: Home / Self Care | Attending: Emergency Medicine | Admitting: Emergency Medicine

## 2023-10-09 ENCOUNTER — Other Ambulatory Visit: Payer: Self-pay

## 2023-10-09 ENCOUNTER — Emergency Department (HOSPITAL_COMMUNITY)

## 2023-10-09 DIAGNOSIS — X58XXXA Exposure to other specified factors, initial encounter: Secondary | ICD-10-CM | POA: Insufficient documentation

## 2023-10-09 DIAGNOSIS — T17308A Unspecified foreign body in larynx causing other injury, initial encounter: Secondary | ICD-10-CM

## 2023-10-09 DIAGNOSIS — T17920A Food in respiratory tract, part unspecified causing asphyxiation, initial encounter: Secondary | ICD-10-CM | POA: Diagnosis present

## 2023-10-09 DIAGNOSIS — F0390 Unspecified dementia without behavioral disturbance: Secondary | ICD-10-CM | POA: Diagnosis not present

## 2023-10-09 DIAGNOSIS — Z7901 Long term (current) use of anticoagulants: Secondary | ICD-10-CM | POA: Insufficient documentation

## 2023-10-09 DIAGNOSIS — R251 Tremor, unspecified: Secondary | ICD-10-CM | POA: Insufficient documentation

## 2023-10-09 LAB — CBC WITH DIFFERENTIAL/PLATELET
Abs Immature Granulocytes: 0.04 K/uL (ref 0.00–0.07)
Basophils Absolute: 0.1 K/uL (ref 0.0–0.1)
Basophils Relative: 1 %
Eosinophils Absolute: 0.2 K/uL (ref 0.0–0.5)
Eosinophils Relative: 2 %
HCT: 38.8 % — ABNORMAL LOW (ref 39.0–52.0)
Hemoglobin: 12.5 g/dL — ABNORMAL LOW (ref 13.0–17.0)
Immature Granulocytes: 1 %
Lymphocytes Relative: 16 %
Lymphs Abs: 1.4 K/uL (ref 0.7–4.0)
MCH: 28.4 pg (ref 26.0–34.0)
MCHC: 32.2 g/dL (ref 30.0–36.0)
MCV: 88.2 fL (ref 80.0–100.0)
Monocytes Absolute: 0.7 K/uL (ref 0.1–1.0)
Monocytes Relative: 8 %
Neutro Abs: 6.5 K/uL (ref 1.7–7.7)
Neutrophils Relative %: 72 %
Platelets: 375 K/uL (ref 150–400)
RBC: 4.4 MIL/uL (ref 4.22–5.81)
RDW: 13.1 % (ref 11.5–15.5)
WBC: 8.8 K/uL (ref 4.0–10.5)
nRBC: 0 % (ref 0.0–0.2)

## 2023-10-09 LAB — BASIC METABOLIC PANEL WITH GFR
Anion gap: 13 (ref 5–15)
BUN: 16 mg/dL (ref 8–23)
CO2: 21 mmol/L — ABNORMAL LOW (ref 22–32)
Calcium: 8.8 mg/dL — ABNORMAL LOW (ref 8.9–10.3)
Chloride: 106 mmol/L (ref 98–111)
Creatinine, Ser: 0.89 mg/dL (ref 0.61–1.24)
GFR, Estimated: >60 mL/min (ref >60)
Glucose, Bld: 98 mg/dL (ref 70–99)
Potassium: 4.1 mmol/L (ref 3.5–5.1)
Sodium: 140 mmol/L (ref 135–145)

## 2023-10-09 NOTE — Discharge Instructions (Signed)
 Return for any problem.  ?

## 2023-10-09 NOTE — ED Triage Notes (Addendum)
 Pt bib [facility] from [facility]. Per ems facility reports patient started to eat and then started to choke. Patient has tremors at baseline, but facility reported patients tremors started to get worse and facility thought patient was having seizure. While patient was shaking patient was coughing while choking. Facility wants to make sure patient did not aspirate.  Per ems patient, nonverbal at baseline.   YK:Josyzpfzmd, dementia. HTN, afib, seizures EMS VS  88 hr  16 rr  98% RA  CBG 103

## 2023-10-09 NOTE — ED Provider Notes (Signed)
 [place] EMERGENCY DEPARTMENT AT [facility] Provider Note   CSN: [id number] Arrival date & time: 10/09/23  1312     Patient presents with: Choking   Jonathan Berry is a 78 y.o. male.   78 year old male with prior medical history as detailed below presents for evaluation.  Patient is a resident at [facility].  He has advanced dementia.  He is nonverbal at baseline.  Patient was having a meal.  He appears to have choked or started to cough while eating.  He apparently had some shaking associated with this.  He has a history of tremors.  He has a history of seizure.  EMS was called secondary to this event.  On arrival to the ED the patient is calm.  He is in no distress.  He is not choking or having difficulty with his respirations.  He is not having any seizure.  He has a mild tremor.  EMS denies seeing any seizure activity during .  The history is provided by the patient, the EMS personnel and medical records.       Prior to Admission medications   Medication Sig Start Date End Date Taking? Authorizing Provider  acetaminophen  (TYLENOL ) 500 MG tablet Take 500 mg by mouth every 6 (six) hours as needed for mild pain, moderate pain or fever.    [provider]  apixaban  (ELIQUIS ) 5 MG TABS tablet Take 1 tablet (5 mg total) by mouth 2 (two) times daily. 01/17/21   Patsy Lenis, MD  donepezil  (ARICEPT ) 10 MG tablet Take 10 mg by mouth at bedtime.    [provider]  lacosamide  (VIMPAT ) 50 MG TABS tablet Take 1 tablet (50 mg total) by mouth 2 (two) times daily. 04/08/21   Patsey Lot, MD  LORazepam  (ATIVAN ) 1 MG tablet Take 0.5 tablets (0.5 mg total) by mouth 2 (two) times daily as needed for anxiety. 03/24/23   Regalado, Belkys A, MD  Polyethyl Glycol-Propyl Glycol (SYSTANE) 0.4-0.3 % SOLN Place 2 drops into both eyes 4 (four) times daily as needed (dry eyes/redness/irritation/discomfort).    [provider]  sertraline  (ZOLOFT ) 50 MG tablet Take 50  mg by mouth daily. 06/04/22   [provider]  ZINC OXIDE, TOPICAL, (SECURA PROTECTIVE) 10 % CREA Apply 1 Application topically daily as needed (excessive soiling).    [provider]    Allergies: Patient has no known allergies.    Review of Systems  Unable to perform ROS: Dementia    Updated Vital Signs Ht 5' 7 (1.702 m)   Wt 68 kg   BMI 23.49 kg/m   Physical Exam Vitals and nursing note reviewed.  Constitutional:      General: He is not in acute distress.    Appearance: Normal appearance. He is well-developed.  HENT:     Head: Normocephalic and atraumatic.  Eyes:     Conjunctiva/sclera: Conjunctivae normal.     Pupils: Pupils are equal, round, and reactive to light.  Cardiovascular:     Rate and Rhythm: Normal rate and regular rhythm.     Heart sounds: Normal heart sounds.  Pulmonary:     Effort: Pulmonary effort is normal. No respiratory distress.     Breath sounds: Normal breath sounds.  Abdominal:     General: There is no distension.     Palpations: Abdomen is soft.     Tenderness: There is no abdominal tenderness.  Musculoskeletal:        General: No deformity. Normal range of motion.  Cervical back: Normal range of motion and neck supple.  Skin:    General: Skin is warm and dry.  Neurological:     General: No focal deficit present.     Mental Status: He is alert.     Comments: Alert, nonverbal at baseline, appears to be at baseline     (all labs ordered are listed, but only abnormal results are displayed) Labs Reviewed  BASIC METABOLIC PANEL WITH GFR  CBC WITH DIFFERENTIAL/PLATELET    EKG: None  Radiology: No results found.   Procedures   Medications Ordered in the ED - No data to display                                  Medical Decision Making Patient presents after episode of choking while eating a meal.  On arrival the patient is in no distress.  He is no longer having any difficulty with his breathing.  No clear  evidence of significant aspiration on initial workup.  He is at baseline mental status.  He has no respiratory issue.  Obtained labs and imaging are without acute abnormality.  Patient's wife is at bedside.  Extensively discussed options for treatment.  She is comfortable with plan to discharge him back to his facility.  Patient's wife is going to discuss the diet of the patient with staff at his facility.  He is already on a diet that consist of very finely chopped up food.  He is a known aspiration risk.  Importance of close follow-up is stressed.  Strict return precautions given and understood.  Amount and/or Complexity of Data Reviewed Labs: ordered. Radiology: ordered.        Final diagnoses:  Choking, initial encounter    ED Discharge Orders     None          Laurice Maude BROCKS, MD 10/09/23 [phone]

## 2023-10-09 NOTE — Progress Notes (Signed)
 [facility] ED [phone] Liaison Note:  This patient is currently enrolled in [facility] outpatient-based palliative care.   Please call for any outpatient based palliative care related questions or concerns.  Thank you, Greig Basket, BSN, RN [facility] Liaison [phone]

## 2023-12-24 ENCOUNTER — Encounter (HOSPITAL_COMMUNITY): Payer: Self-pay

## 2023-12-24 ENCOUNTER — Emergency Department (HOSPITAL_COMMUNITY)

## 2023-12-24 ENCOUNTER — Other Ambulatory Visit: Payer: Self-pay

## 2023-12-24 ENCOUNTER — Inpatient Hospital Stay (HOSPITAL_COMMUNITY)
Admission: EM | Admit: 2023-12-24 | Discharge: 2023-12-30 | DRG: 522 | Disposition: A | Discharge status: Skilled Nursing Facility

## 2023-12-24 DIAGNOSIS — Z7901 Long term (current) use of anticoagulants: Secondary | ICD-10-CM

## 2023-12-24 DIAGNOSIS — F32A Depression, unspecified: Secondary | ICD-10-CM | POA: Diagnosis present

## 2023-12-24 DIAGNOSIS — G20A1 Parkinson's disease without dyskinesia, without mention of fluctuations: Secondary | ICD-10-CM | POA: Diagnosis present

## 2023-12-24 DIAGNOSIS — F0283 Dementia in other diseases classified elsewhere, unspecified severity, with mood disturbance: Secondary | ICD-10-CM | POA: Diagnosis present

## 2023-12-24 DIAGNOSIS — I4891 Unspecified atrial fibrillation: Secondary | ICD-10-CM | POA: Diagnosis present

## 2023-12-24 DIAGNOSIS — Y92098 Other place in other non-institutional residence as the place of occurrence of the external cause: Secondary | ICD-10-CM

## 2023-12-24 DIAGNOSIS — R1310 Dysphagia, unspecified: Secondary | ICD-10-CM | POA: Diagnosis present

## 2023-12-24 DIAGNOSIS — S72012A Unspecified intracapsular fracture of left femur, initial encounter for closed fracture: Principal | ICD-10-CM | POA: Diagnosis present

## 2023-12-24 DIAGNOSIS — Z66 Do not resuscitate: Secondary | ICD-10-CM | POA: Diagnosis present

## 2023-12-24 DIAGNOSIS — Z86711 Personal history of pulmonary embolism: Secondary | ICD-10-CM

## 2023-12-24 DIAGNOSIS — Z79899 Other long term (current) drug therapy: Secondary | ICD-10-CM

## 2023-12-24 DIAGNOSIS — E7800 Pure hypercholesterolemia, unspecified: Secondary | ICD-10-CM | POA: Diagnosis present

## 2023-12-24 DIAGNOSIS — W1839XA Other fall on same level, initial encounter: Secondary | ICD-10-CM | POA: Diagnosis present

## 2023-12-24 DIAGNOSIS — I10 Essential (primary) hypertension: Secondary | ICD-10-CM | POA: Diagnosis present

## 2023-12-24 DIAGNOSIS — G309 Alzheimer's disease, unspecified: Secondary | ICD-10-CM | POA: Diagnosis present

## 2023-12-24 LAB — CBC
HCT: 33.5 % — ABNORMAL LOW (ref 39.0–52.0)
Hemoglobin: 11.2 g/dL — ABNORMAL LOW (ref 13.0–17.0)
MCH: 30.4 pg (ref 26.0–34.0)
MCHC: 33.4 g/dL (ref 30.0–36.0)
MCV: 90.8 fL (ref 80.0–100.0)
Platelets: 239 K/uL (ref 150–400)
RBC: 3.69 MIL/uL — ABNORMAL LOW (ref 4.22–5.81)
RDW: 13.7 % (ref 11.5–15.5)
WBC: 13.9 K/uL — ABNORMAL HIGH (ref 4.0–10.5)
nRBC: 0 % (ref 0.0–0.2)

## 2023-12-24 LAB — BASIC METABOLIC PANEL WITH GFR
Anion gap: 11 (ref 5–15)
BUN: 25 mg/dL — ABNORMAL HIGH (ref 8–23)
CO2: 24 mmol/L (ref 22–32)
Calcium: 8.1 mg/dL — ABNORMAL LOW (ref 8.9–10.3)
Chloride: 105 mmol/L (ref 98–111)
Creatinine, Ser: 0.86 mg/dL (ref 0.61–1.24)
GFR, Estimated: >60 mL/min (ref >60)
Glucose, Bld: 111 mg/dL — ABNORMAL HIGH (ref 70–99)
Potassium: 3.9 mmol/L (ref 3.5–5.1)
Sodium: 140 mmol/L (ref 135–145)

## 2023-12-24 NOTE — ED Triage Notes (Signed)
 Witnessed fall at facility, [facility]. Patient reportedly got from ground and began walking so EMS was not called. Patient then complaining of hip pain. Xray at facility confirmed left hip fx. ON thinners for PE. Dementia at baseline

## 2023-12-24 NOTE — Progress Notes (Signed)
 Consult received for displaced left femoral neck fx after GLF this am at his facility.  Has dementia, on eliquis  for h/o PE 2022.  X-rays reviewed, will need arthroplasty surgery after eliquis  washout.  Recommend bedrest for now.  Hold eliquis . OK to give diet.  Surgery likely this weekend.  Full consult tomorrow.

## 2023-12-24 NOTE — ED Triage Notes (Signed)
 No obvious sign of head trauma, but patient not reliable historian due to dementia.

## 2023-12-24 NOTE — ED Provider Notes (Signed)
 [place] EMERGENCY DEPARTMENT AT [facility] Provider Note   CSN: [id number] Arrival date & time: 12/24/23  1950     Patient presents with: Shamarr Faucett is a 78 y.o. male.  {Add pertinent medical, surgical, social history, OB history to HPI:716} 78 year old male with past medical history of advanced dementia and Parkinson's disease presenting to the emergency department today after mechanical fall.  This happened at his memory care unit earlier today.  The patient was limping so they checked an x-ray.  There was concern for a possible pelvic fracture.  The patient was brought to the ER for further evaluation regarding this.  He is on Eliquis .  The patient is unable to provide any further history.  He was apparently able to walk on this this afternoon.  He does not have complaints currently.   Fall       Prior to Admission medications  Medication Sig Start Date End Date Taking? Authorizing Provider  acetaminophen  (TYLENOL ) 500 MG tablet Take 500 mg by mouth every 6 (six) hours as needed for mild pain, moderate pain or fever.    [provider]  apixaban  (ELIQUIS ) 5 MG TABS tablet Take 1 tablet (5 mg total) by mouth 2 (two) times daily. 01/17/21   Patsy Lenis, MD  donepezil  (ARICEPT ) 10 MG tablet Take 10 mg by mouth at bedtime.    [provider]  lacosamide  (VIMPAT ) 50 MG TABS tablet Take 1 tablet (50 mg total) by mouth 2 (two) times daily. 04/08/21   Patsey Lot, MD  LORazepam  (ATIVAN ) 1 MG tablet Take 0.5 tablets (0.5 mg total) by mouth 2 (two) times daily as needed for anxiety. 03/24/23   Regalado, Belkys A, MD  Polyethyl Glycol-Propyl Glycol (SYSTANE) 0.4-0.3 % SOLN Place 2 drops into both eyes 4 (four) times daily as needed (dry eyes/redness/irritation/discomfort).    [provider]  sertraline  (ZOLOFT ) 50 MG tablet Take 50 mg by mouth daily. 06/04/22   [provider]  ZINC OXIDE, TOPICAL, (SECURA PROTECTIVE) 10 % CREA  Apply 1 Application topically daily as needed (excessive soiling).    [provider]    Allergies: Patient has no known allergies.    Review of Systems  Reason unable to perform ROS: Advanced dementia.    Updated Vital Signs BP (!) 131/121   Pulse 76   Temp 99.1 F (37.3 C) (Axillary)   Resp 20   SpO2 98%   Physical Exam Vitals and nursing note reviewed.   Gen: NAD Eyes: PERRL, EOMI HEENT: no oropharyngeal swelling Neck: trachea midline, no cervical spine tenderness, no stepoffs or deformities Resp: clear to auscultation bilaterally Card: RRR, no murmurs, rubs, or gallops Abd: nontender, nondistended, pelvis stable Extremities: no calf tenderness, no edema MSK: no thoracic spinal tenderness, no lumbar spinal tenderness, no step-offs or deformities Vascular: 2+ radial pulses bilaterally, 2+ DP pulses bilaterally Neuro: Alert and oriented x 3, equal strength sensation throughout bilateral upper and lower extremities, pill-rolling tremor noted Skin: no rashes    (all labs ordered are listed, but only abnormal results are displayed) Labs Reviewed - No data to display  EKG: None  Radiology: No results found.  {Document cardiac monitor, telemetry assessment procedure when appropriate:32947} Procedures   Medications Ordered in the ED - No data to display    {Click here for ABCD2, HEART and other calculators REFRESH Note before signing:1}  Medical Decision Making 78 year old male with past medical history of advanced dementia and atrial fibrillation on Eliquis  presenting to the emergency department today with concern for a possible pelvic fracture after a mechanical fall at his nursing facility.  Will further evaluate the patient here with a CT scan of his head and cervical spine in addition to a chest x-ray and pelvis x-ray for further evaluation for acute traumatic injuries.  Will hold off on labs at this time to see if this would  potentially be an operative fracture.  If not we will discuss case with orthopedics and reevaluate for ultimate disposition.  Amount and/or Complexity of Data Reviewed Radiology: ordered.   ***  {Document critical care time when appropriate  Document review of labs and clinical decision tools ie CHADS2VASC2, etc  Document your independent review of radiology images and any outside records  Document your discussion with family members, caretakers and with consultants  Document social determinants of health affecting pt's care  Document your decision making why or why not admission, treatments were needed:32947:::1}   Final diagnoses:  None    ED Discharge Orders     None

## 2023-12-25 DIAGNOSIS — I10 Essential (primary) hypertension: Secondary | ICD-10-CM | POA: Diagnosis present

## 2023-12-25 DIAGNOSIS — S72012A Unspecified intracapsular fracture of left femur, initial encounter for closed fracture: Secondary | ICD-10-CM | POA: Diagnosis present

## 2023-12-25 DIAGNOSIS — R5381 Other malaise: Secondary | ICD-10-CM | POA: Diagnosis not present

## 2023-12-25 DIAGNOSIS — Z86711 Personal history of pulmonary embolism: Secondary | ICD-10-CM | POA: Diagnosis not present

## 2023-12-25 DIAGNOSIS — R1310 Dysphagia, unspecified: Secondary | ICD-10-CM | POA: Diagnosis present

## 2023-12-25 DIAGNOSIS — Y92098 Other place in other non-institutional residence as the place of occurrence of the external cause: Secondary | ICD-10-CM | POA: Diagnosis not present

## 2023-12-25 DIAGNOSIS — E7800 Pure hypercholesterolemia, unspecified: Secondary | ICD-10-CM | POA: Diagnosis present

## 2023-12-25 DIAGNOSIS — S72002A Fracture of unspecified part of neck of left femur, initial encounter for closed fracture: Secondary | ICD-10-CM | POA: Diagnosis not present

## 2023-12-25 DIAGNOSIS — N3090 Cystitis, unspecified without hematuria: Secondary | ICD-10-CM | POA: Diagnosis not present

## 2023-12-25 DIAGNOSIS — R509 Fever, unspecified: Secondary | ICD-10-CM | POA: Diagnosis present

## 2023-12-25 DIAGNOSIS — W1839XA Other fall on same level, initial encounter: Secondary | ICD-10-CM | POA: Diagnosis present

## 2023-12-25 DIAGNOSIS — F0390 Unspecified dementia without behavioral disturbance: Secondary | ICD-10-CM | POA: Diagnosis not present

## 2023-12-25 DIAGNOSIS — F0283 Dementia in other diseases classified elsewhere, unspecified severity, with mood disturbance: Secondary | ICD-10-CM | POA: Diagnosis present

## 2023-12-25 DIAGNOSIS — I4891 Unspecified atrial fibrillation: Secondary | ICD-10-CM | POA: Diagnosis present

## 2023-12-25 DIAGNOSIS — Z66 Do not resuscitate: Secondary | ICD-10-CM | POA: Diagnosis present

## 2023-12-25 DIAGNOSIS — Z7901 Long term (current) use of anticoagulants: Secondary | ICD-10-CM | POA: Diagnosis not present

## 2023-12-25 DIAGNOSIS — S72009A Fracture of unspecified part of neck of unspecified femur, initial encounter for closed fracture: Secondary | ICD-10-CM | POA: Diagnosis present

## 2023-12-25 DIAGNOSIS — G309 Alzheimer's disease, unspecified: Secondary | ICD-10-CM | POA: Diagnosis present

## 2023-12-25 DIAGNOSIS — Z79899 Other long term (current) drug therapy: Secondary | ICD-10-CM | POA: Diagnosis not present

## 2023-12-25 DIAGNOSIS — D72819 Decreased white blood cell count, unspecified: Secondary | ICD-10-CM | POA: Diagnosis not present

## 2023-12-25 DIAGNOSIS — F32A Depression, unspecified: Secondary | ICD-10-CM | POA: Diagnosis present

## 2023-12-25 DIAGNOSIS — G20A1 Parkinson's disease without dyskinesia, without mention of fluctuations: Secondary | ICD-10-CM | POA: Diagnosis present

## 2023-12-25 LAB — BASIC METABOLIC PANEL WITH GFR
Anion gap: 8 (ref 5–15)
BUN: 22 mg/dL (ref 8–23)
CO2: 25 mmol/L (ref 22–32)
Calcium: 8 mg/dL — ABNORMAL LOW (ref 8.9–10.3)
Chloride: 106 mmol/L (ref 98–111)
Creatinine, Ser: 0.88 mg/dL (ref 0.61–1.24)
GFR, Estimated: >60 mL/min (ref >60)
Glucose, Bld: 108 mg/dL — ABNORMAL HIGH (ref 70–99)
Potassium: 3.7 mmol/L (ref 3.5–5.1)
Sodium: 139 mmol/L (ref 135–145)

## 2023-12-25 LAB — SURGICAL PCR SCREEN
MRSA, PCR: NEGATIVE
Staphylococcus aureus: NEGATIVE

## 2023-12-25 LAB — CBC
HCT: 32 % — ABNORMAL LOW (ref 39.0–52.0)
Hemoglobin: 10.7 g/dL — ABNORMAL LOW (ref 13.0–17.0)
MCH: 29.7 pg (ref 26.0–34.0)
MCHC: 33.4 g/dL (ref 30.0–36.0)
MCV: 88.9 fL (ref 80.0–100.0)
Platelets: 221 K/uL (ref 150–400)
RBC: 3.6 MIL/uL — ABNORMAL LOW (ref 4.22–5.81)
RDW: 13.7 % (ref 11.5–15.5)
WBC: 12.1 K/uL — ABNORMAL HIGH (ref 4.0–10.5)
nRBC: 0 % (ref 0.0–0.2)

## 2023-12-25 LAB — PROTIME-INR
INR: 1.2 (ref 0.8–1.2)
Prothrombin Time: 16.1 s — ABNORMAL HIGH (ref 11.4–15.2)

## 2023-12-25 MED ORDER — LACOSAMIDE 50 MG PO TABS
50.0000 mg | ORAL_TABLET | Freq: Two times a day (BID) | ORAL | Status: DC
  Administered 2023-12-25 – 2023-12-30 (×11): 50 mg via ORAL
  Filled 2023-12-25 (×11): qty 1

## 2023-12-25 MED ORDER — MUPIROCIN 2 % EX OINT
1.0000 | TOPICAL_OINTMENT | Freq: Two times a day (BID) | CUTANEOUS | Status: DC
  Administered 2023-12-25 – 2023-12-30 (×9): 1 via NASAL
  Filled 2023-12-25: qty 22

## 2023-12-25 MED ORDER — ONDANSETRON HCL 4 MG/2ML IJ SOLN: 4.0000 mg | Freq: Four times a day (QID) | INTRAMUSCULAR | Status: DC | PRN

## 2023-12-25 MED ORDER — ACETAMINOPHEN 325 MG PO TABS
650.0000 mg | ORAL_TABLET | Freq: Four times a day (QID) | ORAL | Status: DC | PRN
  Administered 2023-12-25: 650 mg via ORAL
  Filled 2023-12-25: qty 2

## 2023-12-25 MED ORDER — ONDANSETRON HCL 4 MG PO TABS: 4.0000 mg | ORAL_TABLET | Freq: Four times a day (QID) | ORAL | Status: DC | PRN

## 2023-12-25 MED ORDER — ACETAMINOPHEN 650 MG RE SUPP: 650.0000 mg | Freq: Four times a day (QID) | RECTAL | Status: DC | PRN

## 2023-12-25 MED ORDER — DONEPEZIL HCL 10 MG PO TABS
10.0000 mg | ORAL_TABLET | Freq: Every day | ORAL | Status: DC
  Administered 2023-12-25 – 2023-12-29 (×6): 10 mg via ORAL
  Filled 2023-12-25 (×6): qty 1

## 2023-12-25 MED ORDER — SERTRALINE HCL 50 MG PO TABS
50.0000 mg | ORAL_TABLET | Freq: Every day | ORAL | Status: DC
  Administered 2023-12-25: 50 mg via ORAL
  Filled 2023-12-25: qty 1

## 2023-12-25 NOTE — Progress Notes (Signed)
° °  Brief Progress Note   _____________________________________________________________________________________________________________  Patient Name: Jonathan Berry Patient DOB: [birth year] Date: 12/25/2023     Data: Patient admitted with hip fracture.    Action: Chart reviewed for level loading.     Response:  Patient has planned surgery tomorrow in [facility] OR.  _____________________________________________________________________________________________________________  [facility] RN Expeditor Garald Rhew Please contact us  directly via secure chat (search for [facility]) or by calling us  at [phone] [facility]).

## 2023-12-25 NOTE — ED Notes (Signed)
 Patient able swallow pills with apple sauce. No s/sx of aspiration.

## 2023-12-25 NOTE — H&P (View-Only) (Signed)
 Plan for surgery tomorrow am. NPO after MN tonight, cont to hold eliquis .  Full consult later today.

## 2023-12-25 NOTE — ED Notes (Signed)
 Changed patients brief and repositioned in bed

## 2023-12-25 NOTE — Progress Notes (Signed)
 [facility] 10 [facility] Liaison Note:  This is a current GUIDE and outpatient-based palliative care patient with [facility]. [facility] will continue to follow for discharge disposition. Please call with any questions or concerns.  Eleanor Nail, LPN [facility] Liaison [phone]

## 2023-12-25 NOTE — H&P (Signed)
 History and Physical    Erik Burkett FMW:[id number] DOB: 05-17-1945 DOA: 12/24/2023  PCP: [place], [place]  E, PA   Chief Complaint: hip fracture  HPI: Jonathan Berry is a 78 y.o. male with medical history significant of hypertension, hyperlipidemia, dementia, Parkinson's who presents emergency department after mechanical fall.  Patient lives at memory care unit was found to be limping.  They checked an x-ray was found of a pelvic fracture.  Was brought to the ER.  He is on Eliquis .  Patient was unable to provide much history.  Orthopedics was consulted with recommended plans for surgical intervention.  Patient's blood thinner was held.  On evaluation patient was unable to provide any history regarding presentation.  He was asleep and resting comfortably.   Review of Systems: Review of Systems  All other systems reviewed and are negative.    As per HPI otherwise 10 point review of systems negative.   Allergies[1]  Past Medical History:  Diagnosis Date   Alzheimer disease (HCC)    COVID    Hypercholesteremia    Hypertension    Seizures (HCC)     Past Surgical History:  Procedure Laterality Date   KNEE SURGERY     MANDIBLE SURGERY       reports that he has never smoked. He has never used smokeless tobacco. He reports current alcohol use. He reports that he does not use drugs.  Family History  Problem Relation Age of Onset   Diabetes Mellitus II Neg Hx     Prior to Admission medications  Medication Sig Start Date End Date Taking? Authorizing Provider  acetaminophen  (TYLENOL ) 500 MG tablet Take 500 mg by mouth every 6 (six) hours as needed for mild pain, moderate pain or fever.    [provider]  apixaban  (ELIQUIS ) 5 MG TABS tablet Take 1 tablet (5 mg total) by mouth 2 (two) times daily. 01/17/21   Patsy Lenis, MD  donepezil  (ARICEPT ) 10 MG tablet Take 10 mg by mouth at bedtime.    [provider]  lacosamide  (VIMPAT ) 50 MG TABS tablet Take 1 tablet (50 mg  total) by mouth 2 (two) times daily. 04/08/21   Patsey Lot, MD  LORazepam  (ATIVAN ) 1 MG tablet Take 0.5 tablets (0.5 mg total) by mouth 2 (two) times daily as needed for anxiety. 03/24/23   Regalado, Belkys A, MD  Polyethyl Glycol-Propyl Glycol (SYSTANE) 0.4-0.3 % SOLN Place 2 drops into both eyes 4 (four) times daily as needed (dry eyes/redness/irritation/discomfort).    [provider]  sertraline  (ZOLOFT ) 50 MG tablet Take 50 mg by mouth daily. 06/04/22   [provider]  ZINC OXIDE, TOPICAL, (SECURA PROTECTIVE) 10 % CREA Apply 1 Application topically daily as needed (excessive soiling).    [provider]    Physical Exam: Vitals:   12/24/23 2200 12/24/23 2230 12/24/23 2300 12/24/23 2330  BP: 138/84 (!) 103/56  (!) 113/56  Pulse: 92 81 89 85  Resp: 20 18 18 13   Temp:    98.7 F (37.1 C)  TempSrc:    Oral  SpO2: 92% 91% 92% 93%   Physical Exam Vitals reviewed.  Constitutional:      Appearance: He is normal weight.  HENT:     Head: Normocephalic.     Nose: Nose normal.     Mouth/Throat:     Mouth: Mucous membranes are moist.     Pharynx: Oropharynx is clear.  Eyes:     Conjunctiva/sclera: Conjunctivae normal.     Pupils: Pupils  are equal, round, and reactive to light.  Cardiovascular:     Rate and Rhythm: Normal rate and regular rhythm.     Pulses: Normal pulses.     Heart sounds: Normal heart sounds.  Pulmonary:     Effort: Pulmonary effort is normal.     Breath sounds: Normal breath sounds.  Abdominal:     General: Abdomen is flat. Bowel sounds are normal.     Palpations: Abdomen is soft.  Musculoskeletal:        General: Normal range of motion.     Cervical back: Normal range of motion.  Skin:    General: Skin is warm.     Capillary Refill: Capillary refill takes less than 2 seconds.  Neurological:     Mental Status: He is alert. Mental status is at baseline. He is disoriented.  Psychiatric:        Mood and Affect: Mood normal.         Labs on Admission: I have personally reviewed the patients's labs and imaging studies.  Assessment/Plan Principal Problem:   Femoral neck fracture (HCC)   # Left femoral neck fracture - Mechanical fall - Lives at memory care unit - Orthopedic consulted with plans for surgical intervention this weekend  Plan: N.p.o. Hold Eliquis  Appreciate surgical recommendations  # History of seizure-continue Vimpat   # Cognitive impairment-continue Aricept   # Depression-continue Zoloft     Admission status: Inpatient Med-Surg  Certification: The appropriate patient status for this patient is INPATIENT. Inpatient status is judged to be reasonable and necessary in order to provide the required intensity of service to ensure the patient's safety. The patient's presenting symptoms, physical exam findings, and initial radiographic and laboratory data in the context of their chronic comorbidities is felt to place them at high risk for further clinical deterioration. Furthermore, it is not anticipated that the patient will be medically stable for discharge from the hospital within 2 midnights of admission.   * I certify that at the point of admission it is my clinical judgment that the patient will require inpatient hospital care spanning beyond 2 midnights from the point of admission due to high intensity of service, high risk for further deterioration and high frequency of surveillance required.DEWAINE Lamar Dess MD [facility] If 7PM-7AM, please contact night-coverage [email or web address]  12/25/2023, 12:07 AM        [1] No Known Allergies

## 2023-12-25 NOTE — Plan of Care (Signed)
 Patient seen and rounded on in the ER.  See H&P for full A&P. Mr. Jonathan Berry is a 78 year old male with pertinent PMH dementia, Parkinson's,and PE in 2022 (still on Eliquis ) who presented after a presumed mechanical fall at his memory care.  He was noted to be limping and brought to the ER for further workup. X-ray showed acute displaced and impacted left femoral neck fracture. Eliquis  was held on admission.  Orthopedic surgery was consulted.  Plan: - continue holding Eliquis ; unclear if really needs to still be on it given PE was in 2022 - Orthopedic surgery planning on repair on Saturday; n.p.o. at midnight -Per wife, patient requires soft diet nectar thick - rest of plan as per H&P  Alm Apo, MD [facility] 12/25/2023, 12:20 PM

## 2023-12-25 NOTE — Progress Notes (Signed)
 Plan for surgery tomorrow am. NPO after MN tonight, cont to hold eliquis .  Full consult later today.

## 2023-12-26 ENCOUNTER — Encounter (HOSPITAL_COMMUNITY): Payer: Self-pay | Admitting: Internal Medicine

## 2023-12-26 ENCOUNTER — Inpatient Hospital Stay (HOSPITAL_COMMUNITY): Payer: Self-pay

## 2023-12-26 ENCOUNTER — Encounter (HOSPITAL_COMMUNITY)
Admission: EM | Disposition: A | Discharge status: Skilled Nursing Facility | Payer: Self-pay | Source: Home / Self Care | Attending: Internal Medicine

## 2023-12-26 ENCOUNTER — Inpatient Hospital Stay (HOSPITAL_COMMUNITY)

## 2023-12-26 DIAGNOSIS — Z86711 Personal history of pulmonary embolism: Secondary | ICD-10-CM

## 2023-12-26 DIAGNOSIS — S72002A Fracture of unspecified part of neck of left femur, initial encounter for closed fracture: Principal | ICD-10-CM

## 2023-12-26 DIAGNOSIS — F0390 Unspecified dementia without behavioral disturbance: Secondary | ICD-10-CM

## 2023-12-26 LAB — GLUCOSE, CAPILLARY: Glucose-Capillary: 121 mg/dL — ABNORMAL HIGH (ref 70–99)

## 2023-12-26 SURGERY — HEMIARTHROPLASTY, HIP, DIRECT ANTERIOR APPROACH, FOR FRACTURE
Anesthesia: General | Laterality: Left

## 2023-12-26 MED ORDER — ACETAMINOPHEN 10 MG/ML IV SOLN
INTRAVENOUS | Status: AC
  Filled 2023-12-26: qty 100

## 2023-12-26 MED ORDER — FENTANYL CITRATE (PF) 100 MCG/2ML IJ SOLN
INTRAMUSCULAR | Status: AC
  Filled 2023-12-26: qty 2

## 2023-12-26 MED ORDER — ONDANSETRON HCL 4 MG PO TABS: 4.0000 mg | ORAL_TABLET | Freq: Four times a day (QID) | ORAL | Status: DC | PRN

## 2023-12-26 MED ORDER — CHLORHEXIDINE GLUCONATE 4 % EX SOLN
60.0000 mL | Freq: Once | CUTANEOUS | Status: DC
  Filled 2023-12-26: qty 60

## 2023-12-26 MED ORDER — ROCURONIUM BROMIDE 10 MG/ML (PF) SYRINGE
PREFILLED_SYRINGE | INTRAVENOUS | Status: DC | PRN
  Administered 2023-12-26: 60 mg via INTRAVENOUS

## 2023-12-26 MED ORDER — CHLORHEXIDINE GLUCONATE 0.12 % MT SOLN: 15.0000 mL | Freq: Once | OROMUCOSAL | Status: DC

## 2023-12-26 MED ORDER — APIXABAN 5 MG PO TABS
5.0000 mg | ORAL_TABLET | Freq: Two times a day (BID) | ORAL | Status: DC
  Administered 2023-12-27 – 2023-12-30 (×7): 5 mg via ORAL
  Filled 2023-12-26 (×7): qty 1

## 2023-12-26 MED ORDER — FENTANYL CITRATE (PF) 250 MCG/5ML IJ SOLN
INTRAMUSCULAR | Status: DC | PRN
  Administered 2023-12-26 (×2): 12.5 ug via INTRAVENOUS

## 2023-12-26 MED ORDER — DOCUSATE SODIUM 100 MG PO CAPS
100.0000 mg | ORAL_CAPSULE | Freq: Two times a day (BID) | ORAL | Status: DC
  Administered 2023-12-26 – 2023-12-29 (×5): 100 mg via ORAL
  Filled 2023-12-26 (×7): qty 1

## 2023-12-26 MED ORDER — POVIDONE-IODINE 10 % EX SWAB
2.0000 | Freq: Once | CUTANEOUS | Status: AC
  Administered 2023-12-26: 2 via TOPICAL

## 2023-12-26 MED ORDER — LIDOCAINE 2% (20 MG/ML) 5 ML SYRINGE
INTRAMUSCULAR | Status: DC | PRN
  Administered 2023-12-26: 60 mg via INTRAVENOUS

## 2023-12-26 MED ORDER — MENTHOL 3 MG MT LOZG: 1.0000 | LOZENGE | OROMUCOSAL | Status: DC | PRN

## 2023-12-26 MED ORDER — CEFAZOLIN SODIUM-DEXTROSE 2-4 GM/100ML-% IV SOLN
2.0000 g | INTRAVENOUS | Status: AC
  Administered 2023-12-26: 2 g via INTRAVENOUS

## 2023-12-26 MED ORDER — ACETAMINOPHEN 500 MG PO TABS
500.0000 mg | ORAL_TABLET | Freq: Four times a day (QID) | ORAL | Status: AC
  Administered 2023-12-26 – 2023-12-27 (×3): 500 mg via ORAL
  Filled 2023-12-26 (×3): qty 1

## 2023-12-26 MED ORDER — PHENYLEPHRINE HCL-NACL 20-0.9 MG/250ML-% IV SOLN
INTRAVENOUS | Status: DC | PRN
  Administered 2023-12-26: 10 ug/min via INTRAVENOUS

## 2023-12-26 MED ORDER — TRANEXAMIC ACID-NACL 1000-0.7 MG/100ML-% IV SOLN
1000.0000 mg | INTRAVENOUS | Status: AC
  Administered 2023-12-26: 1000 mg via INTRAVENOUS

## 2023-12-26 MED ORDER — PRONTOSAN WOUND IRRIGATION OPTIME
TOPICAL | Status: DC | PRN
  Administered 2023-12-26: 1 via TOPICAL

## 2023-12-26 MED ORDER — ACETAMINOPHEN 325 MG PO TABS: 325.0000 mg | ORAL_TABLET | Freq: Four times a day (QID) | ORAL | Status: DC | PRN

## 2023-12-26 MED ORDER — LACTATED RINGERS IV SOLN: INTRAVENOUS | Status: DC

## 2023-12-26 MED ORDER — SORBITOL 70 % SOLN: 30.0000 mL | Freq: Every day | Status: DC | PRN

## 2023-12-26 MED ORDER — ONDANSETRON HCL 4 MG/2ML IJ SOLN: 4.0000 mg | Freq: Four times a day (QID) | INTRAMUSCULAR | Status: DC | PRN

## 2023-12-26 MED ORDER — MORPHINE SULFATE (PF) 2 MG/ML IV SOLN
0.5000 mg | INTRAVENOUS | Status: DC | PRN
  Administered 2023-12-27: 1 mg via INTRAVENOUS
  Filled 2023-12-26: qty 1

## 2023-12-26 MED ORDER — SODIUM CHLORIDE 0.9 % IR SOLN
Status: DC | PRN
  Administered 2023-12-26: 3000 mL

## 2023-12-26 MED ORDER — HYDROCODONE-ACETAMINOPHEN 5-325 MG PO TABS: 1.0000 | ORAL_TABLET | ORAL | Status: DC | PRN

## 2023-12-26 MED ORDER — SENNA 8.6 MG PO TABS
1.0000 | ORAL_TABLET | Freq: Two times a day (BID) | ORAL | Status: DC
  Administered 2023-12-26 – 2023-12-30 (×7): 8.6 mg via ORAL
  Filled 2023-12-26 (×8): qty 1

## 2023-12-26 MED ORDER — METOCLOPRAMIDE HCL 5 MG/ML IJ SOLN: 5.0000 mg | Freq: Three times a day (TID) | INTRAMUSCULAR | Status: DC | PRN

## 2023-12-26 MED ORDER — TRANEXAMIC ACID-NACL 1000-0.7 MG/100ML-% IV SOLN
INTRAVENOUS | Status: AC
  Filled 2023-12-26: qty 100

## 2023-12-26 MED ORDER — TRANEXAMIC ACID-NACL 1000-0.7 MG/100ML-% IV SOLN
1000.0000 mg | Freq: Once | INTRAVENOUS | Status: AC
  Administered 2023-12-26: 1000 mg via INTRAVENOUS

## 2023-12-26 MED ORDER — METOCLOPRAMIDE HCL 5 MG PO TABS: 5.0000 mg | ORAL_TABLET | Freq: Three times a day (TID) | ORAL | Status: DC | PRN

## 2023-12-26 MED ORDER — METHOCARBAMOL 1000 MG/10ML IJ SOLN
500.0000 mg | Freq: Four times a day (QID) | INTRAMUSCULAR | Status: DC | PRN
  Administered 2023-12-27: 500 mg via INTRAVENOUS
  Filled 2023-12-26: qty 10

## 2023-12-26 MED ORDER — POVIDONE-IODINE 10 % EX SWAB: 2.0000 | Freq: Once | CUTANEOUS | Status: AC

## 2023-12-26 MED ORDER — FENTANYL CITRATE (PF) 100 MCG/2ML IJ SOLN
25.0000 ug | INTRAMUSCULAR | Status: DC | PRN
  Administered 2023-12-26: 25 ug via INTRAVENOUS

## 2023-12-26 MED ORDER — PROPOFOL 10 MG/ML IV BOLUS
INTRAVENOUS | Status: AC
  Filled 2023-12-26: qty 20

## 2023-12-26 MED ORDER — ALBUMIN HUMAN 5 % IV SOLN: INTRAVENOUS | Status: DC | PRN

## 2023-12-26 MED ORDER — MAGNESIUM CITRATE PO SOLN: 1.0000 | Freq: Once | ORAL | Status: DC | PRN

## 2023-12-26 MED ORDER — DROPERIDOL 2.5 MG/ML IJ SOLN: 0.6250 mg | Freq: Once | INTRAMUSCULAR | Status: DC | PRN

## 2023-12-26 MED ORDER — CHLORHEXIDINE GLUCONATE 0.12 % MT SOLN
OROMUCOSAL | Status: AC
  Filled 2023-12-26: qty 15

## 2023-12-26 MED ORDER — ORAL CARE MOUTH RINSE: 15.0000 mL | Freq: Once | OROMUCOSAL | Status: DC

## 2023-12-26 MED ORDER — APIXABAN 2.5 MG PO TABS: 2.5000 mg | ORAL_TABLET | Freq: Two times a day (BID) | ORAL | Status: DC

## 2023-12-26 MED ORDER — HYDROCODONE-ACETAMINOPHEN 7.5-325 MG PO TABS
1.0000 | ORAL_TABLET | ORAL | Status: DC | PRN
  Administered 2023-12-26 – 2023-12-27 (×2): 2 via ORAL
  Filled 2023-12-26 (×2): qty 2

## 2023-12-26 MED ORDER — PROPOFOL 10 MG/ML IV BOLUS
INTRAVENOUS | Status: DC | PRN
  Administered 2023-12-26: 60 mg via INTRAVENOUS

## 2023-12-26 MED ORDER — CEFAZOLIN SODIUM-DEXTROSE 2-4 GM/100ML-% IV SOLN
INTRAVENOUS | Status: AC
  Filled 2023-12-26: qty 100

## 2023-12-26 MED ORDER — EPHEDRINE SULFATE-NACL 50-0.9 MG/10ML-% IV SOSY
PREFILLED_SYRINGE | INTRAVENOUS | Status: DC | PRN
  Administered 2023-12-26 (×2): 5 mg via INTRAVENOUS

## 2023-12-26 MED ORDER — SODIUM CHLORIDE 0.9 % IV SOLN: INTRAVENOUS | Status: AC

## 2023-12-26 MED ORDER — PHENOL 1.4 % MT LIQD: 1.0000 | OROMUCOSAL | Status: DC | PRN

## 2023-12-26 MED ORDER — METHOCARBAMOL 500 MG PO TABS: 500.0000 mg | ORAL_TABLET | Freq: Four times a day (QID) | ORAL | Status: DC | PRN

## 2023-12-26 MED ORDER — SUGAMMADEX SODIUM 200 MG/2ML IV SOLN
INTRAVENOUS | Status: DC | PRN
  Administered 2023-12-26: 200 mg via INTRAVENOUS

## 2023-12-26 MED ORDER — ACETAMINOPHEN 10 MG/ML IV SOLN
INTRAVENOUS | Status: DC | PRN
  Administered 2023-12-26: 1000 mg via INTRAVENOUS

## 2023-12-26 MED ORDER — 0.9 % SODIUM CHLORIDE (POUR BTL) OPTIME
TOPICAL | Status: DC | PRN
  Administered 2023-12-26: 1000 mL

## 2023-12-26 MED ORDER — ONDANSETRON HCL 4 MG/2ML IJ SOLN
INTRAMUSCULAR | Status: DC | PRN
  Administered 2023-12-26: 4 mg via INTRAVENOUS

## 2023-12-26 MED ORDER — SODIUM CHLORIDE 0.9 % IV SOLN: INTRAVENOUS | Status: DC | PRN

## 2023-12-26 MED ORDER — POLYETHYLENE GLYCOL 3350 17 G PO PACK: 17.0000 g | PACK | Freq: Every day | ORAL | Status: DC | PRN

## 2023-12-26 SURGICAL SUPPLY — 50 items
ALCOHOL 70% 16 OZ (MISCELLANEOUS) ×1 IMPLANT
BAG COUNTER SPONGE SURGICOUNT (BAG) ×1 IMPLANT
BLADE CLIPPER SURG (BLADE) IMPLANT
CABLE CERLAGE W/CRIMP 1.8 (Cable) IMPLANT
CHLORAPREP W/TINT 26 (MISCELLANEOUS) ×1 IMPLANT
COVER SURGICAL LIGHT HANDLE (MISCELLANEOUS) ×1 IMPLANT
DERMABOND ADVANCED .7 DNX12 (GAUZE/BANDAGES/DRESSINGS) ×2 IMPLANT
DRAPE 3/4 80X56 (DRAPES) ×3 IMPLANT
DRAPE C-ARM 42X72 X-RAY (DRAPES) ×1 IMPLANT
DRAPE STERI IOBAN 125X83 (DRAPES) ×1 IMPLANT
DRAPE U-SHAPE 47X51 STRL (DRAPES) ×3 IMPLANT
DRSG AQUACEL AG ADV 3.5X10 (GAUZE/BANDAGES/DRESSINGS) ×1 IMPLANT
DRSG MEPILEX POST OP 4X8 (GAUZE/BANDAGES/DRESSINGS) IMPLANT
ELECT PENCIL ROCKER SW 15FT (MISCELLANEOUS) ×1 IMPLANT
ELECTRODE BLDE 4.0 EZ CLN MEGD (MISCELLANEOUS) ×1 IMPLANT
ELECTRODE REM PT RTRN 9FT ADLT (ELECTROSURGICAL) ×1 IMPLANT
EVACUATOR 1/8 PVC DRAIN (DRAIN) IMPLANT
GLOVE BIO SURGEON STRL SZ7.5 (GLOVE) ×1 IMPLANT
GLOVE BIO SURGEON STRL SZ8.5 (GLOVE) ×2 IMPLANT
GLOVE BIOGEL PI IND STRL 7.5 (GLOVE) ×1 IMPLANT
GLOVE BIOGEL PI IND STRL 8.5 (GLOVE) ×1 IMPLANT
GOWN STRL REUS W/ TWL XL LVL3 (GOWN DISPOSABLE) ×1 IMPLANT
GOWN STRL REUS W/TWL 2XL LVL3 (GOWN DISPOSABLE) ×1 IMPLANT
HEAD MODULAR STD 28MM (Orthopedic Implant) IMPLANT
HOOD PEEL AWAY T7 (MISCELLANEOUS) ×2 IMPLANT
KIT BASIN OR (CUSTOM PROCEDURE TRAY) ×1 IMPLANT
KIT TURNOVER KIT B (KITS) ×1 IMPLANT
MANIFOLD NEPTUNE II (INSTRUMENTS) ×1 IMPLANT
MARKER SKIN DUAL TIP RULER LAB (MISCELLANEOUS) ×2 IMPLANT
NDL SPNL 18GX3.5 QUINCKE PK (NEEDLE) ×1 IMPLANT
NEEDLE SPNL 18GX3.5 QUINCKE PK (NEEDLE) ×1 IMPLANT
PACK ANTERIOR HIP CUSTOM (KITS) ×1 IMPLANT
PAD ARMBOARD POSITIONER FOAM (MISCELLANEOUS) ×2 IMPLANT
SAW OSC TIP CART 19.5X105X1.3 (SAW) ×1 IMPLANT
SEALER BIPOLAR AQUA 6.0 (INSTRUMENTS) IMPLANT
SET HNDPC FAN SPRY TIP SCT (DISPOSABLE) ×1 IMPLANT
SHELL RINGBLOC BI POLR 28X51MM (Orthopedic Implant) IMPLANT
SOLN 0.9% NACL POUR BTL 1000ML (IV SOLUTION) ×1 IMPLANT
SOLN STERILE WATER BTL 1000 ML (IV SOLUTION) ×3 IMPLANT
SOLUTION PRONTOSAN WOUND 350ML (IRRIGATION / IRRIGATOR) ×1 IMPLANT
STEM FEM HIP HO ARC 18X175 (Stem) IMPLANT
SUT MNCRL AB 3-0 PS2 18 (SUTURE) IMPLANT
SUT MON AB 2-0 CT1 36 (SUTURE) ×1 IMPLANT
SUT VIC AB 2-0 CT1 TAPERPNT 27 (SUTURE) ×1 IMPLANT
SUTURE STRATFX 0 PDS 27 VIOLET (SUTURE) ×1 IMPLANT
SYR 50ML LL SCALE MARK (SYRINGE) ×1 IMPLANT
TOWEL GREEN STERILE (TOWEL DISPOSABLE) ×1 IMPLANT
TOWEL GREEN STERILE FF (TOWEL DISPOSABLE) ×1 IMPLANT
TRAY FOLEY W/BAG SLVR 16FR ST (SET/KITS/TRAYS/PACK) IMPLANT
TUBE SUCTION HIGH CAP CLEAR NV (SUCTIONS) ×1 IMPLANT

## 2023-12-26 NOTE — Anesthesia Procedure Notes (Signed)
 Procedure Name: Intubation Date/Time: 12/26/2023 8:03 AM  Performed by: Virgil Ee, CRNAPre-anesthesia Checklist: Patient identified, Patient being monitored, Timeout performed, Emergency Drugs available and Suction available Patient Re-evaluated:Patient Re-evaluated prior to induction Oxygen Delivery Method: Circle system utilized Preoxygenation: Pre-oxygenation with 100% oxygen Induction Type: IV induction Ventilation: Mask ventilation without difficulty Laryngoscope Size: Glidescope and 4 Grade View: Grade I Tube type: Oral Tube size: 7.0 mm Number of attempts: 1 Airway Equipment and Method: Stylet Placement Confirmation: ETT inserted through vocal cords under direct vision, positive ETCO2 and breath sounds checked- equal and bilateral Secured at: 22 cm Dental Injury: Teeth and Oropharynx as per pre-operative assessment

## 2023-12-26 NOTE — Consult Note (Signed)
 ORTHOPAEDIC CONSULTATION  REQUESTING PHYSICIAN: Cosette Blackwater, MD  PCP:  Cleot[place], [place]  E, PA  Chief Complaint: left hip injury   HPI: Jonathan Berry is a 78 y.o. male with past medical history significant for hypertension, hyperlipidemia, dementia, and Parkinson's disease who presented to the emergency department after mechanical fall.  Patient lives at [facility] memory care, and was found to be limping.  They checked an x-ray was found to have a hip fracture.  He was then brought to the ER, where x-rays show a displaced left femoral neck fracture.  He is on Eliquis  for history of PE in 2022.  Patient was admitted by [facility] for perioperative restratification and optimization.  Orthopedic consultation was placed for management of his hip fracture.  Patient's blood thinner was held.  Patient is unable to contribute to history due to the mental status.  Per his wife, he is able to ambulate with assistance at baseline.  Past Medical History:  Diagnosis Date   Alzheimer disease (HCC)    COVID    Hypercholesteremia    Hypertension    Pulmonary embolism (HCC)    History of. 2022   Seizures (HCC)    Past Surgical History:  Procedure Laterality Date   KNEE SURGERY     MANDIBLE SURGERY     Social History   Socioeconomic History   Marital status: Married    Spouse name: Not on file   Number of children: Not on file   Years of education: Not on file   Highest education level: Not on file  Occupational History   Not on file  Tobacco Use   Smoking status: Never   Smokeless tobacco: Never  Substance and Sexual Activity   Alcohol use: Yes    Comment: 3 glasses of wine a week   Drug use: Never   Sexual activity: Not on file  Other Topics Concern   Not on file  Social History Narrative   Not on file   Social Drivers of Health   Tobacco Use: Low Risk (12/26/2023)   Patient History    Smoking Tobacco Use: Never    Smokeless Tobacco Use: Never    Passive Exposure: Not on file   Financial Resource Strain: Not on file  Food Insecurity: Patient Unable To Answer (03/19/2023)   Hunger Vital Sign    Worried About Running Out of Food in the Last Year: Patient unable to answer    Ran Out of Food in the Last Year: Patient unable to answer  Transportation Needs: Patient Unable To Answer (03/19/2023)   PRAPARE - Transportation    Lack of Transportation (Medical): Patient unable to answer    Lack of Transportation (Non-Medical): Patient unable to answer  Physical Activity: Not on file  Stress: Not on file  Social Connections: Patient Unable To Answer (03/19/2023)   Social Connection and Isolation Panel    Frequency of Communication with Friends and Family: Patient unable to answer    Frequency of Social Gatherings with Friends and Family: Patient unable to answer    Attends Religious Services: Patient unable to answer    Active Member of Clubs or Organizations: Patient unable to answer    Attends Banker Meetings: Patient unable to answer    Marital Status: Patient unable to answer  Depression (PHQ2-9): Not on file  Alcohol Screen: Not on file  Housing: Patient Unable To Answer (03/19/2023)   Housing Stability Vital Sign    Unable to Pay for Housing in  the Last Year: Patient unable to answer    Number of Times Moved in the Last Year: Not on file    Homeless in the Last Year: Patient unable to answer  Utilities: Patient Unable To Answer (03/19/2023)   [facility] Utilities    Threatened with loss of utilities: Patient unable to answer  Health Literacy: Not on file   Family History  Problem Relation Age of Onset   Diabetes Mellitus II Neg Hx    Allergies[1] Prior to Admission medications  Medication Sig Start Date End Date Taking? Authorizing Provider  acetaminophen  (TYLENOL ) 500 MG tablet Take 500 mg by mouth every 6 (six) hours as needed for mild pain, moderate pain or fever.   Yes [provider]  apixaban  (ELIQUIS ) 5 MG TABS tablet Take 1 tablet (5 mg  total) by mouth 2 (two) times daily. 01/17/21  Yes Patsy Lenis, MD  donepezil  (ARICEPT ) 10 MG tablet Take 10 mg by mouth at bedtime.   Yes [provider]  lacosamide  (VIMPAT ) 50 MG TABS tablet Take 1 tablet (50 mg total) by mouth 2 (two) times daily. 04/08/21  Yes Patsey Lot, MD  LORazepam  (ATIVAN ) 1 MG tablet Take 0.5 tablets (0.5 mg total) by mouth 2 (two) times daily as needed for anxiety. 03/24/23  Yes Regalado, Belkys A, MD  Polyethyl Glycol-Propyl Glycol (SYSTANE) 0.4-0.3 % SOLN Place 2 drops into both eyes 4 (four) times daily as needed (dry eyes/redness/irritation/discomfort).   Yes [provider]  sertraline  (ZOLOFT ) 50 MG tablet Take 50 mg by mouth in the morning. 06/04/22  Yes [provider]  ZINC OXIDE, TOPICAL, (SECURA PROTECTIVE) 10 % CREA Apply 1 Application topically daily as needed (excessive soiling).   Yes [provider]   DG Knee Left Port Result Date: 12/24/2023 EXAM: 1 or 2 view(s) Xray of the left knee 12/24/2023 11:05:58 PM COMPARISON: None available. CLINICAL HISTORY: [id number] Closed displaced fracture of left femoral neck (HCC) [id number] Closed displaced fracture of left femoral neck [facility]) [id number] FINDINGS: BONES AND JOINTS: No acute fracture. No malalignment. No significant joint effusion. SOFT TISSUES: The soft tissues are unremarkable. IMPRESSION: 1. No significant abnormality. Electronically signed by: Franky Crease MD 12/24/2023 11:09 PM EST RP Workstation: [id number]   CT Cervical Spine Wo Contrast Result Date: 12/24/2023 EXAM: CT CERVICAL SPINE WITHOUT CONTRAST 12/24/2023 08:55:00 PM TECHNIQUE: CT of the cervical spine was performed without the administration of intravenous contrast. Multiplanar reformatted images are provided for review. Automated exposure control, iterative reconstruction, and/or weight based adjustment of the mA/kV was utilized to reduce the radiation dose to as low as reasonably achievable. COMPARISON: None  available. CLINICAL HISTORY: Ataxia, cervical trauma. FINDINGS: CERVICAL SPINE: BONES AND ALIGNMENT: No acute fracture or traumatic malalignment. DEGENERATIVE CHANGES: Multilevel moderate degenerative changes of the spine. SOFT TISSUES: No prevertebral soft tissue swelling. Atherosclerotic plaque of the carotid arteries within the neck. IMPRESSION: 1. No acute abnormality of the cervical spine. Electronically signed by: Morgane Naveau MD 12/24/2023 09:06 PM EST RP Workstation: [id number]   CT Head Wo Contrast Result Date: 12/24/2023 EXAM: CT HEAD WITHOUT 12/24/2023 08:55:00 PM TECHNIQUE: CT of the head was performed without the administration of intravenous contrast. Automated exposure control, iterative reconstruction, and/or weight based adjustment of the mA/kV was utilized to reduce the radiation dose to as low as reasonably achievable. COMPARISON: 03/18/2023 CLINICAL HISTORY: Head trauma, intracranial arterial injury suspected. FINDINGS: BRAIN AND VENTRICLES: No acute intracranial hemorrhage. No mass effect or midline shift. No extra-axial fluid collection. No evidence of  acute infarct. No hydrocephalus. Nonspecific periventricular and subcortical white matter hypoattenuation, likely chronic microvascular ischemic changes. Cerebral ventricle sizes are concordant with the degree of cerebral volume loss. ORBITS: No acute abnormality. SINUSES AND MASTOIDS: No acute abnormality. SOFT TISSUES AND SKULL: No acute skull fracture. No acute soft tissue abnormality. IMPRESSION: 1. No acute intracranial abnormality. Electronically signed by: Morgane Naveau MD 12/24/2023 09:04 PM EST RP Workstation: [id number]   DG Chest Portable 1 View Result Date: 12/24/2023 EXAM: 1 VIEW(S) XRAY OF THE CHEST 12/24/2023 08:22:00 PM COMPARISON: 10/09/2023 CLINICAL HISTORY: Fall FINDINGS: LUNGS AND PLEURA: No focal pulmonary opacity. No pleural effusion. No pneumothorax. HEART AND MEDIASTINUM: Overlapping cardiac leads. Calcified  aorta. BONES AND SOFT TISSUES: No acute osseous abnormality. IMPRESSION: 1. No acute cardiopulmonary process. Electronically signed by: Morgane Naveau MD 12/24/2023 08:31 PM EST RP Workstation: [id number]   DG Pelvis Portable Result Date: 12/24/2023 EXAM: 1 or 2 VIEW(S) XRAY OF THE PELVIS 12/24/2023 08:22:00 PM COMPARISON: X-ray abdomen 03/21/2023, CT pelvis 09/15/2021. CLINICAL HISTORY: Fall. FINDINGS: BONES AND JOINTS: Acute displaced and impacted left femoral neck fracture. SOFT TISSUES: The soft tissues are unremarkable. IMPRESSION: 1. Acute displaced and impacted left femoral neck fracture. Electronically signed by: Morgane Naveau MD 12/24/2023 08:27 PM EST RP Workstation: [id number]    Positive ROS: All other systems have been reviewed and were otherwise negative with the exception of those mentioned in the HPI and as above.  Physical Exam: General: Somnolent this morning Cardiovascular: No pedal edema Respiratory: No cyanosis, no use of accessory musculature GI: No organomegaly, abdomen is soft and non-tender Skin: No lesions in the area of chief complaint Neurologic: Sensation intact distally Psychiatric: Confused Lymphatic: No axillary or cervical lymphadenopathy  MUSCULOSKELETAL: Examination the left lower extremity reveals no skin wounds or lesions.  He is shortened and externally rotated.  Pain with attempted logrolling of the hip.  Foot is well-perfused.  Unable to perform motor or sensory examination due to somnolence.  Assessment: Displaced left femoral neck fracture  Plan: I discussed the findings with the patient's wife.  Patient has an unstable left femoral neck fracture, which requires surgical intervention for pain control and immediate mobilization out of bed.  We discussed the risk, benefits, and alternatives to left hip hemiarthroplasty.  We discussed the morbidity and mortality of operative versus nonoperative treatment.  She understands.  Plan for surgery later  today.  Continue NPO.  Hold Eliquis  preoperatively.  All questions solicited and answered.  The risks, benefits, and alternatives were discussed with the patient. There are risks associated with the surgery including, but not limited to, problems with anesthesia (death), infection, instability (giving out of the joint), dislocation, differences in leg length/angulation/rotation, fracture of bones, loosening or failure of implants, hematoma (blood accumulation) which may require surgical drainage, blood clots, pulmonary embolism, nerve injury (foot drop and lateral thigh numbness), and blood vessel injury. The patient's wife understands these risks and elects to proceed.   Redell JINNY Shoals, MD [phone]    12/26/2023 7:46 AM     [1] No Known Allergies

## 2023-12-26 NOTE — Op Note (Signed)
 OPERATIVE REPORT  SURGEON: Redell Shoals, MD   ASSISTANT: Staff  PREOPERATIVE DIAGNOSIS: Displaced Left femoral neck fracture.   POSTOPERATIVE DIAGNOSIS: Displaced Left femoral neck fracture.   PROCEDURE: Left hip hemiarthroplasty, anterior approach.   IMPLANTS: Biomet Arcos One-Piece High Offset Broach Body stem, size 18 x 175 mm, with a 28+0 mm metal headball and a 51 mm bipolar head ball. Zimmer adult reconstruction cable x1.  ANESTHESIA:  General  ANTIBIOTICS: 2g ancef .  ESTIMATED BLOOD LOSS:-150 mL    DRAINS: None.  COMPLICATIONS: None   CONDITION: PACU - hemodynamically stable.   BRIEF CLINICAL NOTE: Jonathan Berry is a 78 y.o. male with a displaced Left femoral neck fracture. The patient was admitted to the hospitalist service and underwent perioperative risk stratification and medical optimization. Adequate time for Eliquis  washout was allowed.  The risks, benefits, and alternatives to hemiarthroplasty were explained, and the patient's POA elected to proceed.  PROCEDURE IN DETAIL: The patient was taken to the operating room and general anesthesia was induced on the hospital bed.  The patient was then positioned on the Hana table.  All bony prominences were well padded.  The hip was prepped and draped in the normal sterile surgical fashion.  A time-out was called verifying side and site of surgery. Antibiotics were given within 60 minutes of beginning the procedure.   Bikini incision was made, and the direct anterior approach to the hip was performed through the Hueter interval.  Superficial dissection was carried out lateral to the ASIS. Lateral femoral circumflex vessels were treated with the Auqumantys. The anterior capsule was exposed and an inverted T capsulotomy was made. Fracture hematoma was encountered and evacuated. The patient was found to have a comminuted Left subcapital femoral neck fracture.  Inferior pubofemoral ligament was released subperiosteally to the lesser  trochanter. I freshened the femoral neck cut with a saw.  I removed the femoral neck fragment.  A corkscrew was placed into the head and the head was removed.  This was passed to the back table and was measured.  Bone quality was exceptionally poor.   Acetabular exposure was achieved.  I examined the articular cartilage which was intact.  The labrum was intact. A 51 mm trial head was placed and found to have excellent fit.   I then gained femoral exposure taking care to protect the abductors and greater trochanter.  The superior capsule was incised longitudinally, staying lateral to the posterior border of the femoral neck. External rotation, extension, and adduction were applied.  During extension, the base of the trochanter began to avulse, so I placed a single prophylactic adult reconstruction cable distal to the lesser trochanter.  A cookie cutter was used to enter the femoral canal, and then the femoral canal finder was used to confirm location.  I then reamed and broached sequentially broached up to a size 18.  Calcar planer was used on the femoral neck remnant.  I placed a high offset neck and the trial bipolar construct. The hip was reduced.  Leg lengths were checked fluoroscopically.  The hip was dislocated and trial components were removed.  I placed the real stem followed by the real bipolar construct.  A single reduction maneuver was performed and the hip was reduced.  Fluoroscopy was used to confirm component position and leg lengths.  At 90 degrees of external rotation and extension, the hip was stable to an anterior directed force.   The wound was copiously irrigated with Prontosan solution and normal saline using pulse  lavage.  The wound was closed in layers using #1 Stratafix for the fascia, 2-0 Vicryl for the subcutaneous fat, 2-0 Monocryl for the deep dermal layer, and staples + Dermabond for the skin.  Once the glue was fully dried, an Aquacell Ag dressing was applied.  The patient was then  awakened from anesthesia and transported to the recovery room in stable condition.  Sponge, needle, and instrument counts were correct at the end of the case x2.  The patient tolerated the procedure well and there were no known complications.  Please note that a surgical assistant was a medical necessity for this procedure to perform it in a safe and expeditious manner. Assistant was necessary to provide appropriate retraction of vital neurovascular structures, to prevent femoral fracture, and to allow for anatomic placement of the prosthesis.

## 2023-12-26 NOTE — Anesthesia Postprocedure Evaluation (Signed)
 Anesthesia Post Note  Patient: Jonathan Berry  Procedure(s) Performed: HEMIARTHROPLASTY, HIP, DIRECT ANTERIOR APPROACH, FOR FRACTURE (Left)     Patient location during evaluation: PACU Anesthesia Type: General Level of consciousness: awake and alert Pain management: pain level controlled Vital Signs Assessment: post-procedure vital signs reviewed and stable Respiratory status: spontaneous breathing, nonlabored ventilation, respiratory function stable and patient connected to nasal cannula oxygen Cardiovascular status: blood pressure returned to baseline and stable Postop Assessment: no apparent nausea or vomiting Anesthetic complications: no   No notable events documented.  Last Vitals:  Vitals:   12/26/23 1330 12/26/23 1349  BP: (!) 121/58 132/61  Pulse: 62 64  Resp: 17 18  Temp:  36.6 C  SpO2: 100% 92%    Last Pain:  Vitals:   12/26/23 1349  TempSrc: Oral  PainSc:                  Cordella SQUIBB Aylen Rambert

## 2023-12-26 NOTE — Transfer of Care (Signed)
 Immediate Anesthesia Transfer of Care Note  Patient: Jonathan Berry  Procedure(s) Performed: HEMIARTHROPLASTY, HIP, DIRECT ANTERIOR APPROACH, FOR FRACTURE (Left)  Patient Location: PACU  Anesthesia Type:General  Level of Consciousness: drowsy and confused  Airway & Oxygen Therapy: Patient Spontanous Breathing  Post-op Assessment: Report given to RN and Post -op Vital signs reviewed and stable  Post vital signs: Reviewed and stable  Last Vitals:  Vitals Value Taken Time  BP 130/62 12/26/23 10:38  Temp    Pulse 69 12/26/23 10:44  Resp 19 12/26/23 10:44  SpO2 90 % 12/26/23 10:44  Vitals shown include unfiled device data.  Last Pain:  Vitals:   12/26/23 0618  TempSrc: Oral  PainSc:          Complications: No notable events documented.

## 2023-12-26 NOTE — Progress Notes (Signed)
 PROGRESS NOTE    Jonathan Berry  FMW:[id number] DOB: [birth year] DOA: 12/24/2023 PCP: Kirby Shove, MD  Subjective: No new subjective & objective note has been filed under this hospital service since the last note was generated. No acute events overnight. Seen and examined at bedside in PACU after orthopedic procedure. Unable to provide much history, unclear baseline given hx of dementia.   Hospital Course:  As per H&P: 78 y.o. male with medical history significant of hypertension, hyperlipidemia, dementia, Parkinson's who presents emergency department after mechanical fall.  Patient lives at memory care unit was found to be limping.  They checked an x-ray was found of a pelvic fracture.  Was brought to the ER.  He is on Eliquis .  Patient was unable to provide much history.  Orthopedics was consulted with recommended plans for surgical intervention.  Patient's blood thinner was held.  On evaluation patient was unable to provide any history regarding presentation.    Assessment and Plan:  Acute Traumatic Left femoral neck fracture - due to Mechanical fall - s/p L hip hemiarthroplasty on 12/13 - pain control - it seems plan is to resume anticoagulation 12/14 per ortho - PT/OT eval to start tomorrow - Orthopedic surg following   History of unprovoked submassive PE with cor pulmonale - in 2022  - resume apixaban  on 12/14  History of seizure -continue Vimpat    Dementia Parkinson's - Lives at memory care unit -continue Aricept  - monitor on fall and delirium precautions  Chronic Dysphagia - as per family, on mechanical soft diet as outpatient - switch to mechanical soft diet - will get SLP eval   Depression -continue Zoloft   DVT prophylaxis: apixaban  (ELIQUIS ) tablet 2.5 mg Start: 12/27/23 1000 SCDs Start: 12/26/23 1215 apixaban  (ELIQUIS ) tablet 2.5 mg  SCDs   Code Status: Limited: Do not attempt resuscitation (DNR) -DNR-LIMITED -Do Not Intubate/DNI   Disposition Plan: TBD   Reason for continuing need for hospitalization: monitor for stability for orthopedic procedure, PT/OT eval pending  Objective: Vitals:   12/26/23 1230 12/26/23 1300 12/26/23 1330 12/26/23 1349  BP: (!) 127/55 (!) 129/58 (!) 121/58 132/61  Pulse: 69 66 62 64  Resp: 20 19 17 18   Temp:    97.9 F (36.6 C)  TempSrc:    Oral  SpO2: 100% 100% 100% 92%  Weight:      Height:        Intake/Output Summary (Last 24 hours) at 12/26/2023 1419 Last data filed at 12/26/2023 1043 Gross per 24 hour  Intake 1640 ml  Output 1050 ml  Net 590 ml   Filed Weights   12/26/23 0641  Weight: 68 kg    Examination:  Physical Exam Vitals and nursing note reviewed.  Constitutional:      General: He is not in acute distress.    Appearance: He is ill-appearing.     Comments: Weak, frail  HENT:     Head: Normocephalic and atraumatic.  Cardiovascular:     Rate and Rhythm: Normal rate and regular rhythm.     Pulses: Normal pulses.     Heart sounds: Normal heart sounds.  Pulmonary:     Effort: Pulmonary effort is normal.     Breath sounds: Normal breath sounds.  Abdominal:     General: Bowel sounds are normal.     Palpations: Abdomen is soft.  Neurological:     Mental Status: He is alert. He is disoriented.     Data Reviewed: I have personally reviewed following labs and imaging studies  CBC: Recent Labs  Lab 12/24/23 2125 12/25/23 0333  WBC 13.9* 12.1*  HGB 11.2* 10.7*  HCT 33.5* 32.0*  MCV 90.8 88.9  PLT 239 221   Basic Metabolic Panel: Recent Labs  Lab 12/24/23 2125 12/25/23 0333  NA 140 139  K 3.9 3.7  CL 105 106  CO2 24 25  GLUCOSE 111* 108*  BUN 25* 22  CREATININE 0.86 0.88  CALCIUM  8.1* 8.0*   GFR: Estimated Creatinine Clearance: 64.7 mL/min (by C-G formula based on SCr of 0.88 mg/dL). Liver Function Tests: No results for input(s): AST, ALT, ALKPHOS, BILITOT, PROT, ALBUMIN  in the last 168 hours. No results for input(s): LIPASE, AMYLASE in the  last 168 hours. No results for input(s): AMMONIA in the last 168 hours. Coagulation Profile: Recent Labs  Lab 12/25/23 0333  INR 1.2   Cardiac Enzymes: No results for input(s): CKTOTAL, CKMB, CKMBINDEX, TROPONINI in the last 168 hours. ProBNP, BNP (last 5 results) No results for input(s): PROBNP, BNP in the last 8760 hours. HbA1C: No results for input(s): HGBA1C in the last 72 hours. CBG: No results for input(s): GLUCAP in the last 168 hours. Lipid Profile: No results for input(s): CHOL, HDL, LDLCALC, TRIG, CHOLHDL, LDLDIRECT in the last 72 hours. Thyroid Function Tests: No results for input(s): TSH, T4TOTAL, FREET4, T3FREE, THYROIDAB in the last 72 hours. Anemia Panel: No results for input(s): VITAMINB12, FOLATE, FERRITIN, TIBC, IRON, RETICCTPCT in the last 72 hours. Sepsis Labs: No results for input(s): PROCALCITON, LATICACIDVEN in the last 168 hours.  Recent Results (from the past 240 hours)  Surgical PCR screen     Status: None   Collection Time: 12/25/23  9:15 PM   Specimen: Nasal Mucosa; Nasal Swab  Result Value Ref Range Status   MRSA, PCR NEGATIVE NEGATIVE Final   Staphylococcus aureus NEGATIVE NEGATIVE Final    Comment: (NOTE) The Xpert SA Assay (FDA approved for NASAL specimens in patients 87 years of age and older), is one component of a comprehensive surveillance program. It is not intended to diagnose infection nor to guide or monitor treatment. Performed at [facility], [address] [place], [place] [address]      Radiology Studies: DG C-Arm 1-60 Min-No Report Result Date: 12/26/2023 Fluoroscopy was utilized by the requesting physician.  No radiographic interpretation.   DG C-Arm 1-60 Min-No Report Result Date: 12/26/2023 Fluoroscopy was utilized by the requesting physician.  No radiographic interpretation.   DG Knee Left Port Result Date: 12/24/2023 EXAM: 1 or 2 view(s) Xray of the  left knee 12/24/2023 11:05:58 PM COMPARISON: None available. CLINICAL HISTORY: [id number] Closed displaced fracture of left femoral neck (HCC) [id number] Closed displaced fracture of left femoral neck [facility]) [id number] FINDINGS: BONES AND JOINTS: No acute fracture. No malalignment. No significant joint effusion. SOFT TISSUES: The soft tissues are unremarkable. IMPRESSION: 1. No significant abnormality. Electronically signed by: Franky Crease MD 12/24/2023 11:09 PM EST RP Workstation: [id number]   CT Cervical Spine Wo Contrast Result Date: 12/24/2023 EXAM: CT CERVICAL SPINE WITHOUT CONTRAST 12/24/2023 08:55:00 PM TECHNIQUE: CT of the cervical spine was performed without the administration of intravenous contrast. Multiplanar reformatted images are provided for review. Automated exposure control, iterative reconstruction, and/or weight based adjustment of the mA/kV was utilized to reduce the radiation dose to as low as reasonably achievable. COMPARISON: None available. CLINICAL HISTORY: Ataxia, cervical trauma. FINDINGS: CERVICAL SPINE: BONES AND ALIGNMENT: No acute fracture or traumatic malalignment. DEGENERATIVE CHANGES: Multilevel moderate degenerative changes of the spine. SOFT TISSUES: No prevertebral  soft tissue swelling. Atherosclerotic plaque of the carotid arteries within the neck. IMPRESSION: 1. No acute abnormality of the cervical spine. Electronically signed by: Morgane Naveau MD 12/24/2023 09:06 PM EST RP Workstation: [id number]   CT Head Wo Contrast Result Date: 12/24/2023 EXAM: CT HEAD WITHOUT 12/24/2023 08:55:00 PM TECHNIQUE: CT of the head was performed without the administration of intravenous contrast. Automated exposure control, iterative reconstruction, and/or weight based adjustment of the mA/kV was utilized to reduce the radiation dose to as low as reasonably achievable. COMPARISON: 03/18/2023 CLINICAL HISTORY: Head trauma, intracranial arterial injury suspected. FINDINGS: BRAIN AND VENTRICLES: No  acute intracranial hemorrhage. No mass effect or midline shift. No extra-axial fluid collection. No evidence of acute infarct. No hydrocephalus. Nonspecific periventricular and subcortical white matter hypoattenuation, likely chronic microvascular ischemic changes. Cerebral ventricle sizes are concordant with the degree of cerebral volume loss. ORBITS: No acute abnormality. SINUSES AND MASTOIDS: No acute abnormality. SOFT TISSUES AND SKULL: No acute skull fracture. No acute soft tissue abnormality. IMPRESSION: 1. No acute intracranial abnormality. Electronically signed by: Morgane Naveau MD 12/24/2023 09:04 PM EST RP Workstation: [id number]   DG Chest Portable 1 View Result Date: 12/24/2023 EXAM: 1 VIEW(S) XRAY OF THE CHEST 12/24/2023 08:22:00 PM COMPARISON: 10/09/2023 CLINICAL HISTORY: Fall FINDINGS: LUNGS AND PLEURA: No focal pulmonary opacity. No pleural effusion. No pneumothorax. HEART AND MEDIASTINUM: Overlapping cardiac leads. Calcified aorta. BONES AND SOFT TISSUES: No acute osseous abnormality. IMPRESSION: 1. No acute cardiopulmonary process. Electronically signed by: Morgane Naveau MD 12/24/2023 08:31 PM EST RP Workstation: [id number]   DG Pelvis Portable Result Date: 12/24/2023 EXAM: 1 or 2 VIEW(S) XRAY OF THE PELVIS 12/24/2023 08:22:00 PM COMPARISON: X-ray abdomen 03/21/2023, CT pelvis 09/15/2021. CLINICAL HISTORY: Fall. FINDINGS: BONES AND JOINTS: Acute displaced and impacted left femoral neck fracture. SOFT TISSUES: The soft tissues are unremarkable. IMPRESSION: 1. Acute displaced and impacted left femoral neck fracture. Electronically signed by: Morgane Naveau MD 12/24/2023 08:27 PM EST RP Workstation: [id number]    Scheduled Meds:  acetaminophen   500 mg Oral Q6H   [START ON 12/27/2023] apixaban   2.5 mg Oral BID   docusate sodium   100 mg Oral BID   donepezil   10 mg Oral QHS   lacosamide   50 mg Oral BID   mupirocin  ointment  1 Application Nasal BID   senna  1 tablet Oral BID    Continuous Infusions:  sodium chloride  75 mL/hr at 12/26/23 1225     LOS: 1 day   Norval Bar, MD  [facility]  12/26/2023, 2:19 PM

## 2023-12-26 NOTE — Plan of Care (Signed)
   Problem: Clinical Measurements: Goal: Ability to maintain clinical measurements within normal limits will improve Outcome: Progressing Goal: Will remain free from infection Outcome: Progressing Goal: Respiratory complications will improve Outcome: Progressing Goal: Cardiovascular complication will be avoided Outcome: Progressing   Problem: Activity: Goal: Risk for activity intolerance will decrease Outcome: Progressing

## 2023-12-26 NOTE — Anesthesia Preprocedure Evaluation (Addendum)
 Anesthesia Evaluation  Patient identified by MRN, date of birth, ID band Patient confused    Reviewed: Allergy & Precautions, NPO status , Patient's Chart, lab work & pertinent test results  Airway Mallampati: II  TM Distance: >3 FB Neck ROM: Full    Dental no notable dental hx.    Pulmonary PE   Pulmonary exam normal        Cardiovascular hypertension,  Rhythm:Regular Rate:Normal     Neuro/Psych Seizures -,       Dementia    GI/Hepatic negative GI ROS, Neg liver ROS,,,  Endo/Other  negative endocrine ROS    Renal/GU      Musculoskeletal Left hip fracture    Abdominal Normal abdominal exam  (+)   Peds  Hematology Lab Results      Component                Value               Date                      WBC                      12.1 (H)            12/25/2023                HGB                      10.7 (L)            12/25/2023                HCT                      32.0 (L)            12/25/2023                MCV                      88.9                12/25/2023                PLT                      221                 12/25/2023             Lab Results      Component                Value               Date                      NA                       139                 12/25/2023                K                        3.7  12/25/2023                CO2                      25                  12/25/2023                GLUCOSE                  108 (H)             12/25/2023                BUN                      22                  12/25/2023                CREATININE               0.88                12/25/2023                CALCIUM                   8.0 (L)             12/25/2023                GFRNONAA                 >60                 12/25/2023              Anesthesia Other Findings   Reproductive/Obstetrics                              Anesthesia  Physical Anesthesia Plan  ASA: 3  Anesthesia Plan: General   Post-op Pain Management: Tylenol  PO (pre-op)*   Induction: Intravenous  PONV Risk Score and Plan: 2 and Ondansetron , Dexamethasone  and Treatment may vary due to age or medical condition  Airway Management Planned: Mask and Oral ETT  Additional Equipment: None  Intra-op Plan:   Post-operative Plan: Extubation in OR  Informed Consent: I have reviewed the patients History and Physical, chart, labs and discussed the procedure including the risks, benefits and alternatives for the proposed anesthesia with the patient or authorized representative who has indicated his/her understanding and acceptance.   Patient has DNR.  Discussed DNR with power of attorney.   Dental advisory given and Consent reviewed with POA  Plan Discussed with: CRNA  Anesthesia Plan Comments:          Anesthesia Quick Evaluation

## 2023-12-26 NOTE — Discharge Instructions (Signed)
 Dr. Redell Shoals Joint Replacement Specialist [facility] [address], [address] [place], [place] [address] [phone]   HIP REPLACEMENT POSTOPERATIVE DIRECTIONS    Hip Rehabilitation, Guidelines Following Surgery   WEIGHT BEARING Weight bearing as tolerated with assist device (walker, cane, etc) as directed, use it as long as suggested by your surgeon or therapist, typically at least 4-6 weeks.  The results of a hip operation are greatly improved after range of motion and muscle strengthening exercises. Follow all safety measures which are given to protect your hip. If any of these exercises cause increased pain or swelling in your joint, decrease the amount until you are comfortable again. Then slowly increase the exercises. Call your caregiver if you have problems or questions.   HOME CARE INSTRUCTIONS  Most of the following instructions are designed to prevent the dislocation of your new hip.  Remove items at home which could result in a fall. This includes throw rugs or furniture in walking pathways.  Continue medications as instructed at time of discharge. You may have some home medications which will be placed on hold until you complete the course of blood thinner medication. You may start showering once you are discharged home. Do not remove your dressing. Do not put on socks or shoes without following the instructions of your caregivers.   Sit on chairs with arms. Use the chair arms to help push yourself up when arising.  Arrange for the use of a toilet seat elevator so you are not sitting low.  Walk with walker as instructed.  You may resume a sexual relationship in one month or when given the OK by your caregiver.  Use walker as long as suggested by your caregivers.  You may put full weight on your legs and walk as much as is comfortable. Avoid periods of inactivity such as sitting longer than an hour when not asleep. This helps prevent blood clots.   You may return to work once you are cleared by designer, industrial/product.  Do not drive a car for 6 weeks or until released by your surgeon.  Do not drive while taking narcotics.  Wear elastic stockings for two weeks following surgery during the day but you may remove then at night.  Make sure you keep all of your appointments after your operation with all of your doctors and caregivers. You should call the office at the above phone number and make an appointment for approximately two weeks after the date of your surgery. Please pick up a stool softener and laxative for home use as long as you are requiring pain medications. ICE to the affected hip every three hours for 30 minutes at a time and then as needed for pain and swelling. Continue to use ice on the hip for pain and swelling from surgery. You may notice swelling that will progress down to the foot and ankle.  This is normal after surgery.  Elevate the leg when you are not up walking on it.   It is important for you to complete the blood thinner medication as prescribed by your doctor. Continue to use the breathing machine which will help keep your temperature down.  It is common for your temperature to cycle up and down following surgery, especially at night when you are not up moving around and exerting yourself.  The breathing machine keeps your lungs expanded and your temperature down.  RANGE OF MOTION AND STRENGTHENING EXERCISES  These exercises are designed to help you keep  full movement of your hip joint. Follow your caregiver's or physical therapist's instructions. Perform all exercises about fifteen times, three times per day or as directed. Exercise both hips, even if you have had only one joint replacement. These exercises can be done on a training (exercise) mat, on the floor, on a table or on a bed. Use whatever works the best and is most comfortable for you. Use music or television while you are exercising so that the exercises are a pleasant  break in your day. This will make your life better with the exercises acting as a break in routine you can look forward to.  Lying on your back, slowly slide your foot toward your buttocks, raising your knee up off the floor. Then slowly slide your foot back down until your leg is straight again.  Lying on your back spread your legs as far apart as you can without causing discomfort.  Lying on your back, tighten up the muscles of your buttocks both with the legs straight and with the knee bent at a comfortable angle while keeping your heel on the floor.   SKILLED REHAB INSTRUCTIONS: If the patient is transferred to a skilled rehab facility following release from the hospital, a list of the current medications will be sent to the facility for the patient to continue.  When discharged from the skilled rehab facility, please have the facility set up the patient's Home Health Physical Therapy prior to being released. Also, the skilled facility will be responsible for providing the patient with their medications at time of release from the facility to include their pain medication and their blood thinner medication. If the patient is still at the rehab facility at time of the two week follow up appointment, the skilled rehab facility will also need to assist the patient in arranging follow up appointment in our office and any transportation needs.  POST-OPERATIVE OPIOID TAPER INSTRUCTIONS: It is important to wean off of your opioid medication as soon as possible. If you do not need pain medication after your surgery it is ok to stop day one. Opioids include: Codeine, Hydrocodone (Norco, Vicodin), Oxycodone(Percocet, oxycontin) and hydromorphone amongst others.  Long term and even short term use of opiods can cause: Increased pain response Dependence Constipation Depression Respiratory depression And more.  Withdrawal symptoms can include Flu like symptoms Nausea, vomiting And more Techniques to  manage these symptoms Hydrate well Eat regular healthy meals Stay active Use relaxation techniques(deep breathing, meditating, yoga) Do Not substitute Alcohol to help with tapering If you have been on opioids for less than two weeks and do not have pain than it is ok to stop all together.  Plan to wean off of opioids This plan should start within one week post op of your joint replacement. Maintain the same interval or time between taking each dose and first decrease the dose.  Cut the total daily intake of opioids by one tablet each day Next start to increase the time between doses. The last dose that should be eliminated is the evening dose.    MAKE SURE YOU:  Understand these instructions.  Will watch your condition.  Will get help right away if you are not doing well or get worse.  Pick up stool softner and laxative for home use following surgery while on pain medications. Do not remove your dressing. The dressing is waterproof--it is OK to take showers. Continue to use ice for pain and swelling after surgery. Do not use any lotions or  creams on the incision until instructed by your surgeon. No active abduction for 6 weeks. Total Hip Protocol.

## 2023-12-26 NOTE — Interval H&P Note (Signed)
 History and Physical Interval Note:  12/26/2023 7:44 AM  Jonathan Berry  has presented today for surgery, with the diagnosis of LEFT HIP FRACTURE.  The various methods of treatment have been discussed with the patient and family. After consideration of risks, benefits and other options for treatment, the patient has consented to  Procedures: HEMIARTHROPLASTY, HIP, DIRECT ANTERIOR APPROACH, FOR FRACTURE (Left) as a surgical intervention.  The patient's history has been reviewed, patient examined, no change in status, stable for surgery.  I have reviewed the patient's chart and labs.  Questions were answered to the patient's satisfaction.     Redell PARAS Ronika Kelson

## 2023-12-27 DIAGNOSIS — R5381 Other malaise: Secondary | ICD-10-CM

## 2023-12-27 DIAGNOSIS — R1310 Dysphagia, unspecified: Secondary | ICD-10-CM

## 2023-12-27 LAB — BASIC METABOLIC PANEL WITH GFR
Anion gap: 12 (ref 5–15)
BUN: 16 mg/dL (ref 8–23)
CO2: 26 mmol/L (ref 22–32)
Calcium: 8.5 mg/dL — ABNORMAL LOW (ref 8.9–10.3)
Chloride: 102 mmol/L (ref 98–111)
Creatinine, Ser: 0.99 mg/dL (ref 0.61–1.24)
GFR, Estimated: >60 mL/min (ref >60)
Glucose, Bld: 124 mg/dL — ABNORMAL HIGH (ref 70–99)
Potassium: 4.2 mmol/L (ref 3.5–5.1)
Sodium: 140 mmol/L (ref 135–145)

## 2023-12-27 LAB — CBC
HCT: 28 % — ABNORMAL LOW (ref 39.0–52.0)
Hemoglobin: 9.7 g/dL — ABNORMAL LOW (ref 13.0–17.0)
MCH: 30.6 pg (ref 26.0–34.0)
MCHC: 34.6 g/dL (ref 30.0–36.0)
MCV: 88.3 fL (ref 80.0–100.0)
Platelets: 221 K/uL (ref 150–400)
RBC: 3.17 MIL/uL — ABNORMAL LOW (ref 4.22–5.81)
RDW: 13.6 % (ref 11.5–15.5)
WBC: 12.5 K/uL — ABNORMAL HIGH (ref 4.0–10.5)
nRBC: 0 % (ref 0.0–0.2)

## 2023-12-27 LAB — GLUCOSE, CAPILLARY
Glucose-Capillary: 121 mg/dL — ABNORMAL HIGH (ref 70–99)
Glucose-Capillary: 120 mg/dL — ABNORMAL HIGH (ref 70–99)

## 2023-12-27 NOTE — Evaluation (Signed)
 Physical Therapy Evaluation Patient Details Name: Jonathan Berry MRN: [id number] DOB: 17-Oct-1945 Today's Date: 12/27/2023  History of Present Illness  Pt is a 78 y.o male admitted 12/11 from [facility] memory care for fall. X-ray showed L femoral neck fx. S/p L hip anterior hemiarthroplasty 12/13. EFY:dzpslmz, dementia, PE, nonverbal at baseline, PD  Clinical Impression  Pt admitted from memory care unit with wife at bedside providing PLOF. Pt would ambulate with 1HH assist with assist for all ADLs. Pt is nonverbal at baseline and would intermittently follow commands throughout session. Required TotalAx2 for bed mobility and MaxAx2 to step-pivot to the right. Assist needed to advance LE's towards recliner and to shift hips. Recommending <3hrs post acute rehab to decrease caregiver burden and reduce falls risk. Acute PT to follow.         If plan is discharge home, recommend the following: A lot of help with walking and/or transfers;A lot of help with bathing/dressing/bathroom;Assist for transportation;Help with stairs or ramp for entrance;Direct supervision/assist for medications management;Direct supervision/assist for financial management;Assistance with cooking/housework;Supervision due to cognitive status   Can travel by private vehicle   No    Equipment Recommendations Wheelchair (measurements PT);Wheelchair cushion (measurements PT);BSC/3in1     Functional Status Assessment Patient has had a recent decline in their functional status and demonstrates the ability to make significant improvements in function in a reasonable and predictable amount of time.     Precautions / Restrictions Precautions Precautions: Fall Recall of Precautions/Restrictions: Impaired Restrictions Weight Bearing Restrictions Per Provider Order: Yes LLE Weight Bearing Per Provider Order: Weight bearing as tolerated Other Position/Activity Restrictions: No active abduction of L hip      Mobility  Bed  Mobility Overal bed mobility: Needs Assistance Bed Mobility: Supine to Sit    Supine to sit: Total assist, +2 for physical assistance, +2 for safety/equipment    General bed mobility comments: Total +2 assist using helicopter method with bed pad. Pt not following cues to assist    Transfers Overall transfer level: Needs assistance Equipment used: 2 person hand held assist Transfers: Sit to/from Stand, Bed to chair/wheelchair/BSC Sit to Stand: Mod assist, +2 physical assistance, +2 safety/equipment   Step pivot transfers: Max assist, +2 physical assistance, +2 safety/equipment    General transfer comment: Mod +2 HH assist to rise to stand. Once in standing.Max +2 assist to step, assist to facilitate steps and to balance    Ambulation/Gait    General Gait Details: unable this date     Balance Overall balance assessment: Needs assistance Sitting-balance support: Bilateral upper extremity supported, Feet supported Sitting balance-Leahy Scale: Poor Sitting balance - Comments: Posterior lean, min assist to support Postural control: Posterior lean Standing balance support: Bilateral upper extremity supported, During functional activity Standing balance-Leahy Scale: Zero Standing balance comment: reliant on therapist support        Pertinent Vitals/Pain Pain Assessment Faces Pain Scale: Hurts little more Pain Location: L hip Pain Descriptors / Indicators: Grimacing Pain Intervention(s): Limited activity within patient's tolerance, Monitored during session, Repositioned    Home Living Family/patient expects to be discharged to:: Other (Comment)    Additional Comments: Memory care at [facility]    Prior Function Prior Level of Function : Needs assist    Mobility Comments: Spouse reports walking with hand held assistance, shuffling gait ADLs Comments: spouse reports pt can feed himself with prompting, incontient wears briefs. Assist largely d/t cog deficits Fully non verbal      Extremity/Trunk Assessment   Upper Extremity Assessment Upper  Extremity Assessment: Defer to OT evaluation    Lower Extremity Assessment Lower Extremity Assessment: LLE deficits/detail;Difficult to assess due to impaired cognition;RLE deficits/detail RLE Deficits / Details: Not following commands for MMT. Limited PROM for hip and knee extension/flexion. Questionable tone versus resistance to movement. B LE tremors at rest and with movement LLE Deficits / Details: Not following commands for MMT. Limited PROM for hip and knee extension/flexion. Questionable tone versus resistance to movement. B LE tremors at rest and with movement    Cervical / Trunk Assessment Cervical / Trunk Assessment: Kyphotic  Communication   Communication Communication: Impaired Factors Affecting Communication: Difficulty expressing self (Baseline nonverbal)    Cognition Arousal: Lethargic Behavior During Therapy: Flat affect   PT - Cognitive impairments: History of cognitive impairments    PT - Cognition Comments: Nonverbal with inconsistent following of commands. Able to step through depends with assist to start task. Wife reports at cognitive baseline Following commands: Impaired Following commands impaired: Follows one step commands inconsistently, Follows one step commands with increased time     Cueing Cueing Techniques: Verbal cues, Tactile cues, Visual cues, Gestural cues     General Comments General comments (skin integrity, edema, etc.): Wife present and supportive     PT Assessment Patient needs continued PT services  PT Problem List Decreased strength;Decreased range of motion;Decreased balance;Decreased activity tolerance;Decreased mobility;Decreased cognition;Decreased knowledge of use of DME;Decreased safety awareness;Decreased knowledge of precautions;Impaired tone       PT Treatment Interventions DME instruction;Gait training;Functional mobility training;Therapeutic  activities;Therapeutic exercise;Balance training;Neuromuscular re-education;Patient/family education;Wheelchair mobility training    PT Goals (Current goals can be found in the Care Plan section)  Acute Rehab PT Goals Patient Stated Goal: unable to state goal PT Goal Formulation: Patient unable to participate in goal setting Time For Goal Achievement: 01/10/24 Potential to Achieve Goals: Fair    Frequency Min 2X/week     Co-evaluation   Reason for Co-Treatment: Necessary to address cognition/behavior during functional activity;For patient/therapist safety;To address functional/ADL transfers PT goals addressed during session: Mobility/safety with mobility;Balance OT goals addressed during session: ADL's and self-care       AM-PAC PT 6 Clicks Mobility  Outcome Measure Help needed turning from your back to your side while in a flat bed without using bedrails?: Total Help needed moving from lying on your back to sitting on the side of a flat bed without using bedrails?: Total Help needed moving to and from a bed to a chair (including a wheelchair)?: Total Help needed standing up from a chair using your arms (e.g., wheelchair or bedside chair)?: Total Help needed to walk in hospital room?: Total Help needed climbing 3-5 steps with a railing? : Total 6 Click Score: 6    End of Session Equipment Utilized During Treatment: Gait belt Activity Tolerance: Other (comment) (limited by cognition) Patient left: in chair;with call bell/phone within reach;with chair alarm set;with family/visitor present Nurse Communication: Mobility status;Need for lift equipment;Weight bearing status;Precautions PT Visit Diagnosis: Unsteadiness on feet (R26.81);Other abnormalities of gait and mobility (R26.89);Muscle weakness (generalized) (M62.81);Difficulty in walking, not elsewhere classified (R26.2)    Time: 9198-9174 PT Time Calculation (min) (ACUTE ONLY): 24 min   Charges:   PT Evaluation $PT Eval  Moderate Complexity: 1 Mod   PT General Charges $$ ACUTE PT VISIT: 1 Visit        Kate ORN, PT, DPT Secure Chat Preferred  Rehab Office [phone]   Kate BRAVO Wendolyn 12/27/2023, 10:44 AM

## 2023-12-27 NOTE — Evaluation (Addendum)
 Occupational Therapy Evaluation Patient Details Name: Jonathan Berry MRN: [id number] DOB: May 19, 1945 Today's Date: 12/27/2023   History of Present Illness   Pt is a 78 y.o male admitted 12/11 from [facility] memory care for fall. X-ray showed L femoral neck fx. S/p L hip anterior hemiarthroplasty 12/13. EFY:dzpslmz, dementia, PE, nonverbal at baseline, PD     Clinical Impressions Pt admitted based on above, and was seen based on problem list below. PTA pt living at [facility] care facility. His wife reports at baseline he walks with hand held assist and receives assistance for all ADLs and prompting for self-feeding. Today pt is requiring mod to total +2 assist for ADLs. Bed mobility was total +2 assist and functional transfers are  max +2  assist. Some UB ADL assist at baseline d/t cog deficits, but LB ADLs and functional transfers currently below pt's baseline.  Based on pt's performance today, recommending <3 hours of skilled rehab daily. OT will continue to follow acutely to maximize functional independence.     If plan is discharge home, recommend the following:   Two people to help with walking and/or transfers;Two people to help with bathing/dressing/bathroom     Functional Status Assessment   Patient has had a recent decline in their functional status and demonstrates the ability to make significant improvements in function in a reasonable and predictable amount of time.     Equipment Recommendations   Other (comment) (Defer to next venue)      Precautions/Restrictions   Precautions Precautions: Fall Recall of Precautions/Restrictions: Impaired Restrictions Weight Bearing Restrictions Per Provider Order: Yes LLE Weight Bearing Per Provider Order: Weight bearing as tolerated Other Position/Activity Restrictions: No abduction of L hip     Mobility Bed Mobility Overal bed mobility: Needs Assistance Bed Mobility: Supine to Sit     Supine to sit: Total  assist, +2 for physical assistance, +2 for safety/equipment     General bed mobility comments: Total +2 assist using helicopter method with bed pad. Pt not following cues to assist    Transfers Overall transfer level: Needs assistance Equipment used: 2 person hand held assist Transfers: Sit to/from Stand, Bed to chair/wheelchair/BSC Sit to Stand: Mod assist, +2 physical assistance, +2 safety/equipment     Step pivot transfers: Max assist, +2 physical assistance, +2 safety/equipment     General transfer comment: Mod +2 HH assist to rise to stand. Once in standing.Max +2 assist to step, assist to facilitate steps and balance      Balance Overall balance assessment: Needs assistance Sitting-balance support: Bilateral upper extremity supported, Feet supported Sitting balance-Leahy Scale: Poor Sitting balance - Comments: Posterior lean, min assist to support Postural control: Posterior lean Standing balance support: Bilateral upper extremity supported, During functional activity Standing balance-Leahy Scale: Zero Standing balance comment: reliant on therapist support         ADL either performed or assessed with clinical judgement   ADL Overall ADL's : Needs assistance/impaired Eating/Feeding: Sitting;Moderate assistance   Grooming: Sitting;Moderate assistance   Upper Body Bathing: Moderate assistance;Sitting   Lower Body Bathing: Total assistance   Upper Body Dressing : Moderate assistance   Lower Body Dressing: Total assistance   Toilet Transfer: Maximal assistance;+2 for physical assistance;+2 for safety/equipment;Cueing for safety;Cueing for sequencing;Stand-pivot Toilet Transfer Details (indicate cue type and reason): Short step pivot HH assist Toileting- Clothing Manipulation and Hygiene: Total assistance;+2 for physical assistance;+2 for safety/equipment;Sit to/from stand Toileting - Clothing Manipulation Details (indicate cue type and reason): To don brief in  standing  Functional mobility during ADLs: Maximal assistance;+2 for physical assistance;+2 for safety/equipment General ADL Comments: Receives assist at baseline for ADLs largely d/t cognition     Vision Baseline Vision/History: 0 No visual deficits Vision Assessment?: No apparent visual deficits            Pertinent Vitals/Pain Pain Assessment Pain Assessment: Faces Faces Pain Scale: Hurts little more Pain Location: L hip Pain Descriptors / Indicators: Grimacing Pain Intervention(s): Limited activity within patient's tolerance     Extremity/Trunk Assessment Upper Extremity Assessment Upper Extremity Assessment: Generalized weakness   Lower Extremity Assessment Lower Extremity Assessment: Defer to PT evaluation   Cervical / Trunk Assessment Cervical / Trunk Assessment: Kyphotic   Communication Communication Communication: Impaired Factors Affecting Communication: Difficulty expressing self (Baseline nonverbal)   Cognition Arousal: Lethargic Behavior During Therapy: Flat affect Cognition: History of cognitive impairments       OT - Cognition Comments: Hx of dementia, per wife non verbal at baseline. Followed simple commands ~30% of the time     Following commands: Impaired Following commands impaired: Follows one step commands inconsistently, Follows one step commands with increased time     Cueing  General Comments   Cueing Techniques: Verbal cues;Tactile cues;Visual cues;Gestural cues  Wife present and supportive           Home Living Family/patient expects to be discharged to:: Other (Comment)       Additional Comments: Memory care at [facility]      Prior Functioning/Environment Prior Level of Function : Needs assist             Mobility Comments: Spouse reports walking with hand held assistance, shuffling gait ADLs Comments: spouse reports pt can feed himself with prompting, incontient wears briefs. Assist largely d/t cog deficits  Fully non verbal    OT Problem List: Decreased strength;Decreased range of motion;Impaired balance (sitting and/or standing);Decreased activity tolerance;Decreased safety awareness;Decreased knowledge of use of DME or AE;Decreased knowledge of precautions;Pain   OT Treatment/Interventions: Therapeutic exercise;Self-care/ADL training;Energy conservation;DME and/or AE instruction;Therapeutic activities;Patient/family education;Balance training      OT Goals(Current goals can be found in the care plan section)   Acute Rehab OT Goals Patient Stated Goal: None stated OT Goal Formulation: With family Time For Goal Achievement: 01/10/24 Potential to Achieve Goals: Fair ADL Goals Pt Will Perform Lower Body Dressing: with mod assist;sit to/from stand;sitting/lateral leans Pt Will Transfer to Toilet: with mod assist;stand pivot transfer;bedside commode Additional ADL Goal #1: Pt will complete bed mobility with mod assist as precursor for OOB activity   OT Frequency:  Min 2X/week       AM-PAC OT 6 Clicks Daily Activity     Outcome Measure Help from another person eating meals?: A Lot Help from another person taking care of personal grooming?: A Lot Help from another person toileting, which includes using toliet, bedpan, or urinal?: Total Help from another person bathing (including washing, rinsing, drying)?: Total Help from another person to put on and taking off regular upper body clothing?: A Lot Help from another person to put on and taking off regular lower body clothing?: Total 6 Click Score: 9   End of Session Equipment Utilized During Treatment: Gait belt Nurse Communication: Mobility status  Activity Tolerance: Patient tolerated treatment well Patient left: in chair;with call bell/phone within reach;with chair alarm set;with family/visitor present  OT Visit Diagnosis: Unsteadiness on feet (R26.81);Other abnormalities of gait and mobility (R26.89);Muscle weakness (generalized)  (M62.81);History of falling (Z91.81);Pain Pain - Right/Left: Left Pain - part of  body: Hip                Time: 9198-9175 OT Time Calculation (min): 23 min Charges:  OT General Charges $OT Visit: 1 Visit OT Evaluation $OT Eval Moderate Complexity: 1 Mod  Adrianne BROCKS, OT  Acute Rehabilitation Services Office [phone] Secure chat preferred   Adrianne GORMAN Savers 12/27/2023, 10:21 AM

## 2023-12-27 NOTE — Evaluation (Signed)
 Clinical/Bedside Swallow Evaluation Patient Details  Name: Jonathan Berry MRN: [id number] Date of Birth: September 13, 1945  Today's Date: 12/27/2023 Time: SLP Start Time (ACUTE ONLY): 1105 SLP Stop Time (ACUTE ONLY): 1122 SLP Time Calculation (min) (ACUTE ONLY): 17 min  Past Medical History:  Past Medical History:  Diagnosis Date   Alzheimer disease (HCC)    COVID    Hypercholesteremia    Hypertension    Pulmonary embolism (HCC)    History of. 2022   Seizures (HCC)    Past Surgical History:  Past Surgical History:  Procedure Laterality Date   KNEE SURGERY     MANDIBLE SURGERY     HPI:  Jonathan Berry is a 78 y.o. male who presented 12/11 emergency department after mechanical fall at memory care unit at the [facility].  Found to be limping.  They checked an x-ray was found of a pelvic fracture. Now s/p surgical repair. Pt with hx dysphagia and consume mech soft diet at baseline with nectar/mildly thick liquids (confirmed by nursing desk at the [facility]).  Prior evaluation with this service in March of this year with recommendations for puree with mildly thick liquids at that time. Pt with medical history significant of hypertension, hyperlipidemia, dementia, Parkinson's.    Assessment / Plan / Recommendation  Clinical Impression  Pt presents with hx of chronic dysphagia in setting of dementia and Parkinsonism. Pt's wife, Jonathan Berry , present for evaluation.  She could not recall liquid consistency, but on visual inspection believed moderately/honey thick liquids to be most consistent with baseline. He has worked with Megan at [employer] [facility] [employer] in the past.  Pt tolerated both mildly and moderately thick liquids without any overt s/s of aspiration. Wife does not recall if pt has had objective swallow study since evaluation with this service in March of 2025, but does not feel like he requires one at this time. Per son during that admission, family would not want to pursue PEG at that time. Chest  imaging with no acute findings on admission.  He appears to be tolerating PO diet without adverse consequences even with known dysphagia.  Spoke with memory care unit who confirmed baseline diet in mechanical soft with nectar/mildly thick liquids. Pt appears safe to continue baseline diet and has no further ST needs.  SLP will sign off.    Recommend mechanical soft solids with nectar/mildly thick liquids.   SLP Visit Diagnosis: Dysphagia, oropharyngeal phase (R13.12)    Aspiration Risk  Mild aspiration risk    Diet Recommendation Dysphagia 3 (Mech soft);Nectar-thick liquid    Liquid Administration via: Cup;Spoon;Straw Medication Administration:  (As tolerated) Compensations: Slow rate;Small sips/bites Postural Changes: Seated upright at 90 degrees    Other Recommendations Oral Care Recommendations: Oral care BID     Swallow Evaluation Recommendations  N/A   Assistance Recommended at Discharge  N/A  Functional Status Assessment Patient has not had a recent decline in their functional status  Frequency and Duration  (N/A)          Prognosis Prognosis for improved oropharyngeal function:  (N/A)      Swallow Study   General Date of Onset: 12/24/23 HPI: Jonathan Berry is a 78 y.o. male who presented 12/11 emergency department after mechanical fall at memory care unit at the [facility].  Found to be limping.  They checked an x-ray was found of a pelvic fracture. Now s/p surgical repair. Pt with hx dysphagia and consume mech soft diet at baseline with nectar/mildly thick liquids (confirmed  by nursing desk at the [facility]).  Pt with medical history significant of hypertension, hyperlipidemia, dementia, Parkinson's. Type of Study: Bedside Swallow Evaluation Diet Prior to this Study: Dysphagia 3 (mechanical soft);Mildly thick liquids (Level 2, nectar thick) Temperature Spikes Noted: No Behavior/Cognition: Alert;Cooperative Oral Cavity Assessment: Within Functional Limits Oral Care  Completed by SLP: No Oral Cavity - Dentition: Adequate natural dentition Patient Positioning: Upright in chair Baseline Vocal Quality: Not observed Volitional Cough: Cognitively unable to elicit Volitional Swallow: Unable to elicit    Oral/Motor/Sensory Function Overall Oral Motor/Sensory Function:  (Unable to assess)   Ice Chips Ice chips: Not tested   Thin Liquid Thin Liquid: Not tested    Nectar Thick Nectar Thick Liquid: Within functional limits Presentation: Cup   Honey Thick Honey Thick Liquid: Within functional limits Presentation: Cup;Spoon   Puree Puree: Within functional limits   Solid     Solid: Within functional limits Presentation: Spoon Other Comments: soft solid      Anette FORBES Grippe, MA, CCC-SLP Acute Rehabilitation Services Office: [phone] 12/27/2023,11:45 AM

## 2023-12-27 NOTE — Plan of Care (Signed)
°  Problem: Clinical Measurements: Goal: Will remain free from infection Outcome: Progressing Goal: Cardiovascular complication will be avoided Outcome: Progressing   Problem: Elimination: Goal: Will not experience complications related to urinary retention Outcome: Progressing   Problem: Pain Managment: Goal: General experience of comfort will improve and/or be controlled Outcome: Progressing   Problem: Education: Goal: Knowledge of General Education information will improve Description: Including pain rating scale, medication(s)/side effects and non-pharmacologic comfort measures Outcome: Not Progressing

## 2023-12-27 NOTE — Progress Notes (Signed)
 Patient ID: Jonathan Berry, male   DOB: September 06, 1945, 78 y.o.   MRN: [id number] Subjective: 1 Day Post-Op Procedures (LRB): HEMIARTHROPLASTY, HIP, DIRECT ANTERIOR APPROACH, FOR FRACTURE (Left)    Patient resting seemingly comfortable this morning. No events noted  Objective:   VITALS:   Vitals:   12/26/23 2316 12/27/23 0438  BP: (!) 125/46 (!) 108/54  Pulse: 80 87  Resp: 18 19  Temp: 100.2 F (37.9 C) 99.8 F (37.7 C)  SpO2: 94% 94%    Incision: dressing C/D/I - left hip  LABS Recent Labs    12/24/23 2125 12/25/23 0333 12/27/23 0319  HGB 11.2* 10.7* 9.7*  HCT 33.5* 32.0* 28.0*  WBC 13.9* 12.1* 12.5*  PLT 239 221 221    Recent Labs    12/24/23 2125 12/25/23 0333 12/27/23 0319  NA 140 139 140  K 3.9 3.7 4.2  BUN 25* 22 16  CREATININE 0.86 0.88 0.99  GLUCOSE 111* 108* 124*    Recent Labs    12/25/23 0333  INR 1.2     Assessment/Plan: 1 Day Post-Op Procedures (LRB): HEMIARTHROPLASTY, HIP, DIRECT ANTERIOR APPROACH, FOR FRACTURE (Left)   Advance diet Up with therapy as tolerable Disposition likely to have him return to SNF when medically stable  RTC to see Swinteck in 2 weeks Resume Eliquis  WBAT LLE

## 2023-12-27 NOTE — Progress Notes (Signed)
 PROGRESS NOTE    Jonathan Berry  FMW:[id number] DOB: 30-Oct-1945 DOA: 12/24/2023 PCP: Kirby Shove, MD  Subjective: No acute events overnight. Seen and examined at bedside with wife present. Unable to provide much history due to baseline dementia.   Hospital Course:  78 y.o. male with medical history significant of hypertension, hyperlipidemia, dementia, Parkinson's who presents emergency department after mechanical fall.  Patient lives at memory care unit was found to be limping.  They checked an x-ray was found of a pelvic fracture.  Was brought to the ER.  He is on Eliquis .  Patient was unable to provide much history.  Orthopedics was consulted with underwent L hip hemiarthroplasty on 12/13    Assessment and Plan:  Acute Traumatic Left femoral neck fracture - due to Mechanical fall - s/p L hip hemiarthroplasty on 12/13 - pain control - resume apixaban  - PT/OT following, needs rehab - Orthopedic surg following    History of unprovoked submassive PE with cor pulmonale - in 2022  - resume apixaban    History of seizure -continue Vimpat    Dementia Parkinson's - Lives at memory care unit -continue Aricept  - monitor on fall and delirium precautions   Chronic Dysphagia - as per family, on mechanical soft diet, nectar thick liquids as outpatient - cont on mechanical soft, nectar thick diet - SLP recs appreciated   Depression -continue Zoloft   DVT prophylaxis: SCDs Start: 12/26/23 1215 apixaban  (ELIQUIS ) tablet 5 mg  Eliquis    Code Status: Limited: Do not attempt resuscitation (DNR) -DNR-LIMITED -Do Not Intubate/DNI  Family Communication: updated wife at bedside Disposition Plan: SNF vs memory care facility Reason for continuing need for hospitalization: medically ready, placement pending  Objective: Vitals:   12/26/23 1954 12/26/23 2316 12/27/23 0438 12/27/23 0737  BP: (!) 126/43 (!) 125/46 (!) 108/54 93/60  Pulse: 85 80 87 78  Resp: 15 18 19 18   Temp: 100.2 F (37.9  C) 100.2 F (37.9 C) 99.8 F (37.7 C) 99 F (37.2 C)  TempSrc: Axillary Axillary Axillary   SpO2: 93% 94% 94% 93%  Weight:      Height:        Intake/Output Summary (Last 24 hours) at 12/27/2023 1207 Last data filed at 12/27/2023 1123 Gross per 24 hour  Intake 1175 ml  Output 760 ml  Net 415 ml   Filed Weights   12/26/23 0641  Weight: 68 kg    Examination:  Physical Exam Vitals and nursing note reviewed.  Constitutional:      General: He is not in acute distress.    Appearance: He is ill-appearing.  HENT:     Head: Normocephalic and atraumatic.  Cardiovascular:     Rate and Rhythm: Normal rate and regular rhythm.     Pulses: Normal pulses.     Heart sounds: Normal heart sounds.  Pulmonary:     Effort: Pulmonary effort is normal.     Breath sounds: Normal breath sounds.  Abdominal:     General: Bowel sounds are normal.     Palpations: Abdomen is soft.  Neurological:     Mental Status: He is alert. Mental status is at baseline. He is disoriented.     Data Reviewed: I have personally reviewed following labs and imaging studies  CBC: Recent Labs  Lab 12/24/23 2125 12/25/23 0333 12/27/23 0319  WBC 13.9* 12.1* 12.5*  HGB 11.2* 10.7* 9.7*  HCT 33.5* 32.0* 28.0*  MCV 90.8 88.9 88.3  PLT 239 221 221   Basic Metabolic Panel: Recent Labs  Lab 12/24/23 2125 12/25/23 0333 12/27/23 0319  NA 140 139 140  K 3.9 3.7 4.2  CL 105 106 102  CO2 24 25 26   GLUCOSE 111* 108* 124*  BUN 25* 22 16  CREATININE 0.86 0.88 0.99  CALCIUM  8.1* 8.0* 8.5*   GFR: Estimated Creatinine Clearance: 57.5 mL/min (by C-G formula based on SCr of 0.99 mg/dL). Liver Function Tests: No results for input(s): AST, ALT, ALKPHOS, BILITOT, PROT, ALBUMIN  in the last 168 hours. No results for input(s): LIPASE, AMYLASE in the last 168 hours. No results for input(s): AMMONIA in the last 168 hours. Coagulation Profile: Recent Labs  Lab 12/25/23 0333  INR 1.2   Cardiac  Enzymes: No results for input(s): CKTOTAL, CKMB, CKMBINDEX, TROPONINI in the last 168 hours. ProBNP, BNP (last 5 results) No results for input(s): PROBNP, BNP in the last 8760 hours. HbA1C: No results for input(s): HGBA1C in the last 72 hours. CBG: Recent Labs  Lab 12/26/23 2139 12/27/23 1133  GLUCAP 121* 121*   Lipid Profile: No results for input(s): CHOL, HDL, LDLCALC, TRIG, CHOLHDL, LDLDIRECT in the last 72 hours. Thyroid Function Tests: No results for input(s): TSH, T4TOTAL, FREET4, T3FREE, THYROIDAB in the last 72 hours. Anemia Panel: No results for input(s): VITAMINB12, FOLATE, FERRITIN, TIBC, IRON, RETICCTPCT in the last 72 hours. Sepsis Labs: No results for input(s): PROCALCITON, LATICACIDVEN in the last 168 hours.  Recent Results (from the past 240 hours)  Surgical PCR screen     Status: None   Collection Time: 12/25/23  9:15 PM   Specimen: Nasal Mucosa; Nasal Swab  Result Value Ref Range Status   MRSA, PCR NEGATIVE NEGATIVE Final   Staphylococcus aureus NEGATIVE NEGATIVE Final    Comment: (NOTE) The Xpert SA Assay (FDA approved for NASAL specimens in patients 46 years of age and older), is one component of a comprehensive surveillance program. It is not intended to diagnose infection nor to guide or monitor treatment. Performed at [facility], [address], [place], [place] [address]      Radiology Studies: DG Pelvis Portable Result Date: 12/26/2023 EXAM: 1 or 2 VIEW(S) XRAY OF THE PELVIS 12/26/2023 11:19:00 AM COMPARISON: 12/24/2023 CLINICAL HISTORY: Closed displaced fracture of left femoral neck [facility]) [id number] FINDINGS: BONES AND JOINTS: Left hip hemiarthroplasty with cerclage wire fixation in satisfactory position. SOFT TISSUES: Overlying skin staples and soft tissue gas. IMPRESSION: 1. Left hip hemiarthroplasty with cerclage wire fixation in satisfactory position. Electronically signed by: Pinkie Pebbles MD 12/26/2023 05:56 PM EST RP Workstation: [id number]   DG HIP UNILAT WITH PELVIS 1V LEFT Result Date: 12/26/2023 EXAM: FLUOROSCOPIC IMAGING TECHNIQUE: Fluoroscopy was provided by the radiology department for procedure. Radiologist was not present during examination. Fluoroscopy time: 7.2 seconds. RADIATION DOSE INDEX: Reference Air Kerma: 0.52 mGy COMPARISON: None available. CLINICAL HISTORY: Surgery, elective FINDINGS: Intraoperative fluoroscopic imaging was performed during left hip hemiarthroplasty. IMPRESSION: 1. Intraoperative fluoroscopic imaging as above. Please refer to the operative report for full details. Electronically signed by: Pinkie Pebbles MD 12/26/2023 05:55 PM EST RP Workstation: [id number]   DG C-Arm 1-60 Min-No Report Result Date: 12/26/2023 Fluoroscopy was utilized by the requesting physician.  No radiographic interpretation.   DG C-Arm 1-60 Min-No Report Result Date: 12/26/2023 Fluoroscopy was utilized by the requesting physician.  No radiographic interpretation.    Scheduled Meds:  apixaban   5 mg Oral BID   docusate sodium   100 mg Oral BID   donepezil   10 mg Oral QHS   lacosamide   50 mg  Oral BID   mupirocin  ointment  1 Application Nasal BID   senna  1 tablet Oral BID   Continuous Infusions:  sodium chloride  75 mL/hr at 12/27/23 0300     LOS: 2 days   Norval Bar, MD  [facility]  12/27/2023, 12:07 PM

## 2023-12-28 ENCOUNTER — Encounter (HOSPITAL_COMMUNITY): Payer: Self-pay | Admitting: Orthopedic Surgery

## 2023-12-28 LAB — BASIC METABOLIC PANEL WITH GFR
Anion gap: 9 (ref 5–15)
BUN: 17 mg/dL (ref 8–23)
CO2: 25 mmol/L (ref 22–32)
Calcium: 8.5 mg/dL — ABNORMAL LOW (ref 8.9–10.3)
Chloride: 109 mmol/L (ref 98–111)
Creatinine, Ser: 1.03 mg/dL (ref 0.61–1.24)
GFR, Estimated: >60 mL/min (ref >60)
Glucose, Bld: 114 mg/dL — ABNORMAL HIGH (ref 70–99)
Potassium: 4.2 mmol/L (ref 3.5–5.1)
Sodium: 143 mmol/L (ref 135–145)

## 2023-12-28 LAB — CBC
HCT: 28.4 % — ABNORMAL LOW (ref 39.0–52.0)
Hemoglobin: 9.3 g/dL — ABNORMAL LOW (ref 13.0–17.0)
MCH: 29.4 pg (ref 26.0–34.0)
MCHC: 32.7 g/dL (ref 30.0–36.0)
MCV: 89.9 fL (ref 80.0–100.0)
Platelets: 205 K/uL (ref 150–400)
RBC: 3.16 MIL/uL — ABNORMAL LOW (ref 4.22–5.81)
RDW: 13.9 % (ref 11.5–15.5)
WBC: 12.3 K/uL — ABNORMAL HIGH (ref 4.0–10.5)
nRBC: 0 % (ref 0.0–0.2)

## 2023-12-28 LAB — GLUCOSE, CAPILLARY: Glucose-Capillary: 123 mg/dL — ABNORMAL HIGH (ref 70–99)

## 2023-12-28 NOTE — Progress Notes (Signed)
 PROGRESS NOTE    Jonathan Berry  FMW:[id number] DOB: [birth year] DOA: 12/24/2023 PCP: Kirby Shove, MD  Subjective: No acute events overnight. Seen and examined at bedside with wife present. Unable to provide much history due to baseline dementia.    Hospital Course:  78 y.o. male with medical history significant of hypertension, hyperlipidemia, dementia, Parkinson's who presents emergency department after mechanical fall.  Patient lives at memory care unit was found to be limping.  They checked an x-ray was found of a pelvic fracture.  Was brought to the ER.  He is on Eliquis .  Patient was unable to provide much history.  Orthopedics was consulted with underwent L hip hemiarthroplasty on 12/13    Assessment and Plan:  Acute Traumatic Left femoral neck fracture - due to Mechanical fall - s/p L hip hemiarthroplasty on 12/13 - pain control - cont apixaban  - PT/OT following, needs rehab - Orthopedic surg following    History of unprovoked submassive PE with cor pulmonale - in 2022  - cont apixaban    History of seizure -continue Vimpat    Dementia Parkinson's - Lives at memory care unit -continue Aricept  - monitor on fall and delirium precautions   Chronic Dysphagia - as per family, on mechanical soft diet, nectar thick liquids as outpatient - cont on mechanical soft, nectar thick diet - SLP recs appreciated   Depression -continue Zoloft   DVT prophylaxis: SCDs Start: 12/26/23 1215 apixaban  (ELIQUIS ) tablet 5 mg  Eliquis    Code Status: Limited: Do not attempt resuscitation (DNR) -DNR-LIMITED -Do Not Intubate/DNI  Family Communication: updated wife at bedside Disposition Plan: SNF vs back to memory care facility Reason for continuing need for hospitalization: medically ready, placement pending, SW following   Objective: Vitals:   12/27/23 1940 12/28/23 0426 12/28/23 0802 12/28/23 1327  BP: (!) 125/59 (!) 145/58 138/60 (!) 121/50  Pulse: 87 78 71 77  Resp: 18 19 18 18    Temp: 100.3 F (37.9 C) 99.3 F (37.4 C) 98.8 F (37.1 C) 99.7 F (37.6 C)  TempSrc:      SpO2: 95% 99% 99% 97%  Weight:      Height:        Intake/Output Summary (Last 24 hours) at 12/28/2023 1333 Last data filed at 12/28/2023 0857 Gross per 24 hour  Intake 240 ml  Output --  Net 240 ml   Filed Weights   12/26/23 0641  Weight: 68 kg    Examination:  Physical Exam Vitals and nursing note reviewed.  Constitutional:      General: He is not in acute distress.    Appearance: He is ill-appearing.  HENT:     Head: Normocephalic and atraumatic.  Cardiovascular:     Rate and Rhythm: Normal rate and regular rhythm.     Pulses: Normal pulses.     Heart sounds: Normal heart sounds.  Pulmonary:     Effort: Pulmonary effort is normal.     Breath sounds: Normal breath sounds.  Abdominal:     General: Bowel sounds are normal.     Palpations: Abdomen is soft.  Neurological:     Mental Status: He is alert.     Data Reviewed: I have personally reviewed following labs and imaging studies  CBC: Recent Labs  Lab 12/24/23 2125 12/25/23 0333 12/27/23 0319 12/28/23 0551  WBC 13.9* 12.1* 12.5* 12.3*  HGB 11.2* 10.7* 9.7* 9.3*  HCT 33.5* 32.0* 28.0* 28.4*  MCV 90.8 88.9 88.3 89.9  PLT 239 221 221 205   Basic Metabolic  Panel: Recent Labs  Lab 12/24/23 2125 12/25/23 0333 12/27/23 0319 12/28/23 0551  NA 140 139 140 143  K 3.9 3.7 4.2 4.2  CL 105 106 102 109  CO2 24 25 26 25   GLUCOSE 111* 108* 124* 114*  BUN 25* 22 16 17   CREATININE 0.86 0.88 0.99 1.03  CALCIUM  8.1* 8.0* 8.5* 8.5*   GFR: Estimated Creatinine Clearance: 55.3 mL/min (by C-G formula based on SCr of 1.03 mg/dL). Liver Function Tests: No results for input(s): AST, ALT, ALKPHOS, BILITOT, PROT, ALBUMIN  in the last 168 hours. No results for input(s): LIPASE, AMYLASE in the last 168 hours. No results for input(s): AMMONIA in the last 168 hours. Coagulation Profile: Recent Labs  Lab  12/25/23 0333  INR 1.2   Cardiac Enzymes: No results for input(s): CKTOTAL, CKMB, CKMBINDEX, TROPONINI in the last 168 hours. ProBNP, BNP (last 5 results) No results for input(s): PROBNP, BNP in the last 8760 hours. HbA1C: No results for input(s): HGBA1C in the last 72 hours. CBG: Recent Labs  Lab 12/26/23 2139 12/27/23 1133 12/27/23 2119 12/28/23 1329  GLUCAP 121* 121* 120* 123*   Lipid Profile: No results for input(s): CHOL, HDL, LDLCALC, TRIG, CHOLHDL, LDLDIRECT in the last 72 hours. Thyroid Function Tests: No results for input(s): TSH, T4TOTAL, FREET4, T3FREE, THYROIDAB in the last 72 hours. Anemia Panel: No results for input(s): VITAMINB12, FOLATE, FERRITIN, TIBC, IRON, RETICCTPCT in the last 72 hours. Sepsis Labs: No results for input(s): PROCALCITON, LATICACIDVEN in the last 168 hours.  Recent Results (from the past 240 hours)  Surgical PCR screen     Status: None   Collection Time: 12/25/23  9:15 PM   Specimen: Nasal Mucosa; Nasal Swab  Result Value Ref Range Status   MRSA, PCR NEGATIVE NEGATIVE Final   Staphylococcus aureus NEGATIVE NEGATIVE Final    Comment: (NOTE) The Xpert SA Assay (FDA approved for NASAL specimens in patients 47 years of age and older), is one component of a comprehensive surveillance program. It is not intended to diagnose infection nor to guide or monitor treatment. Performed at [facility], [address] [place], [place] [address]      Radiology Studies: No results found.  Scheduled Meds:  apixaban   5 mg Oral BID   docusate sodium   100 mg Oral BID   donepezil   10 mg Oral QHS   lacosamide   50 mg Oral BID   mupirocin  ointment  1 Application Nasal BID   senna  1 tablet Oral BID   Continuous Infusions:   LOS: 3 days   Norval Bar, MD  [facility]  12/28/2023, 1:33 PM

## 2023-12-28 NOTE — Care Management Important Message (Signed)
 Important Message  Patient Details  Name: Kyston Gonce MRN: [id number] Date of Birth: 1945/04/10   Important Message Given:  Yes - Medicare IM     Jennie Laneta Dragon 12/28/2023, 3:49 PM

## 2023-12-28 NOTE — TOC Initial Note (Signed)
 Transition of Care [facility]) - Initial/Assessment Note    Patient Details  Name: Jonathan Berry MRN: [id number] Date of Birth: 08-Apr-1945  Transition of [facility]) CM/SW Contact:    Bridget Cordella Simmonds, LCSW Phone Number: 12/28/2023, 2:33 PM  Clinical Narrative:       Pt oriented x1, does not respond to CSW attempts to engage in conversation.  CSW spoke with wife [name] after she arrived at hospital.  Pt from Memory Care at [facility].  Is mostly non verbal now, sometimes says yes or no.  Pt does walk independently at times.  Discussed PT recommendation for SNF, [name] is agreeable to SNF, medicare choice document provided, permission given to send out referral in the hub.  Referral sent out in hub for SNF.             Expected Discharge Plan: Skilled Nursing Facility Barriers to Discharge: SNF Pending bed offer   Patient Goals and CMS Choice   CMS Medicare.gov Compare Post Acute Care list provided to:: Patient Represenative (must comment) (wife [name]) Choice offered to / list presented to : Spouse      Expected Discharge Plan and Services In-house Referral: Clinical Social Work   Post Acute Care Choice: Skilled Nursing Facility Living arrangements for the past 2 months: Assisted Living Facility ([facility])                                      Prior Living Arrangements/Services Living arrangements for the past 2 months: Assisted Living Facility ([facility]) Lives with:: Facility Resident Patient language and need for interpreter reviewed:: No        Need for Family Participation in Patient Care: Yes (Comment) Care giver support system in place?: Yes (comment) Current home services: Other (comment) (na) Criminal Activity/Legal Involvement Pertinent to Current Situation/Hospitalization: No - Comment as needed  Activities of Daily Living      Permission Sought/Granted                  Emotional Assessment Appearance:: Appears stated  age Attitude/Demeanor/Rapport: Unable to Assess Affect (typically observed): Unable to Assess Orientation: : Oriented to Self      Admission diagnosis:  Femoral neck fracture (HCC) [S72.009A] Left displaced femoral neck fracture (HCC) [S72.002A] Patient Active Problem List   Diagnosis Date Noted   Femoral neck fracture (HCC) 12/25/2023   Fever 03/18/2023   Sepsis (HCC) 03/18/2023   Hypernatremia 03/18/2023   Alzheimer disease (HCC)    Seizure (HCC) 01/15/2021   Protein-calorie malnutrition, severe 01/08/2021   Pulmonary emboli (HCC) 01/06/2021   Pneumonia due to COVID-19 virus 04/25/2020   ARF (acute renal failure) 04/25/2020   DNR (do not resuscitate) 04/25/2020   Essential hypertension 04/25/2020   PCP:  Kirby Shove, MD Pharmacy:   [facility] [place], [place] - [address] [place] [address] Phone: [phone] Fax: [phone]     Social Drivers of Health (SDOH) Social History: SDOH Screenings   Food Insecurity: Patient Unable To Answer (12/26/2023)  Housing: Patient Unable To Answer (12/26/2023)  Transportation Needs: Patient Unable To Answer (12/26/2023)  Utilities: Patient Unable To Answer (12/26/2023)  Social Connections: Patient Unable To Answer (12/26/2023)  Tobacco Use: Low Risk (12/26/2023)   SDOH Interventions:     Readmission Risk Interventions     No data to display

## 2023-12-28 NOTE — NC FL2 (Signed)
 [place]  MEDICAID FL2 LEVEL OF CARE FORM     IDENTIFICATION  Patient Name: Jonathan Berry Birthdate: Aug 03, 1945 Sex: male Admission Date (Current Location): 12/24/2023  [facility] and [place] Number:  Producer, Television/film/video and Address:  [facility], [address] [place], [place] [address]      Provider Number: [id number]  Attending Physician Name and Address:  Cosette Blackwater, MD  Relative Name and Phone Number:  Pietila,Virgina Spouse [phone]  [phone]    Current Level of Care: Hospital Recommended Level of Care: Skilled Nursing Facility Prior Approval Number:    Date Approved/Denied:   PASRR Number: [id number]  Discharge Plan: SNF    Current Diagnoses: Patient Active Problem List   Diagnosis Date Noted   Femoral neck fracture (HCC) 12/25/2023   Fever 03/18/2023   Sepsis (HCC) 03/18/2023   Hypernatremia 03/18/2023   Alzheimer disease (HCC)    Seizure (HCC) 01/15/2021   Protein-calorie malnutrition, severe 01/08/2021   Pulmonary emboli (HCC) 01/06/2021   Pneumonia due to COVID-19 virus 04/25/2020   ARF (acute renal failure) 04/25/2020   DNR (do not resuscitate) 04/25/2020   Essential hypertension 04/25/2020    Orientation RESPIRATION BLADDER Height & Weight     Self  Normal Incontinent, External catheter Weight: 149 lb 14.6 oz (68 kg) Height:  5' 7 (170.2 cm)  BEHAVIORAL SYMPTOMS/MOOD NEUROLOGICAL BOWEL NUTRITION STATUS    Convulsions/Seizures Incontinent Diet (see discharge summary)  AMBULATORY STATUS COMMUNICATION OF NEEDS Skin   Total Care Does not communicate Surgical wounds                       Personal Care Assistance Level of Assistance  Bathing, Feeding, Dressing, Total care Bathing Assistance: Maximum assistance Feeding assistance: Limited assistance Dressing Assistance: Maximum assistance Total Care Assistance: Maximum assistance   Functional Limitations Info  Speech     Speech Info: Impaired     SPECIAL CARE FACTORS FREQUENCY  PT (By licensed PT), OT (By licensed OT)     PT Frequency: 5x week OT Frequency: 5x week            Contractures Contractures Info: Not present    Additional Factors Info  Code Status, Allergies Code Status Info: DNR Allergies Info: NKA           Current Medications (12/28/2023):  This is the current hospital active medication list Current Facility-Administered Medications  Medication Dose Route Frequency Provider Last Rate Last Admin   acetaminophen  (TYLENOL ) tablet 325-650 mg  325-650 mg Oral Q6H PRN Swinteck, Redell, MD       apixaban  (ELIQUIS ) tablet 5 mg  5 mg Oral BID Candance, Amber N, PA-C   5 mg at 12/28/23 1014   docusate sodium  (COLACE) capsule 100 mg  100 mg Oral BID Fidel Redell, MD   100 mg at 12/28/23 1014   donepezil  (ARICEPT ) tablet 10 mg  10 mg Oral QHS Swinteck, Redell, MD   10 mg at 12/27/23 2155   HYDROcodone -acetaminophen  (NORCO) 7.5-325 MG per tablet 1-2 tablet  1-2 tablet Oral Q4H PRN Fidel Redell, MD   2 tablet at 12/27/23 0206   HYDROcodone -acetaminophen  (NORCO/VICODIN) 5-325 MG per tablet 1-2 tablet  1-2 tablet Oral Q4H PRN Swinteck, Redell, MD       lacosamide  (VIMPAT ) tablet 50 mg  50 mg Oral BID Fidel Redell, MD   50 mg at 12/28/23 1013   magnesium  citrate solution 1 Bottle  1 Bottle Oral Once PRN Fidel Redell, MD  menthol  (CEPACOL) lozenge 3 mg  1 lozenge Oral PRN Swinteck, Redell, MD       Or   phenol (CHLORASEPTIC) mouth spray 1 spray  1 spray Mouth/Throat PRN Swinteck, Redell, MD       methocarbamol  (ROBAXIN ) tablet 500 mg  500 mg Oral Q6H PRN Swinteck, Redell, MD       Or   methocarbamol  (ROBAXIN ) injection 500 mg  500 mg Intravenous Q6H PRN Fidel Redell, MD   500 mg at 12/27/23 0203   metoCLOPramide  (REGLAN ) tablet 5-10 mg  5-10 mg Oral Q8H PRN Swinteck, Redell, MD       Or   metoCLOPramide  (REGLAN ) injection 5-10 mg  5-10 mg Intravenous Q8H PRN Swinteck, Redell, MD       morphine  (PF) 2 MG/ML  injection 0.5-1 mg  0.5-1 mg Intravenous Q2H PRN Fidel Redell, MD   1 mg at 12/27/23 9381   mupirocin  ointment (BACTROBAN ) 2 % 1 Application  1 Application Nasal BID Fidel Redell, MD   1 Application at 12/28/23 1017   ondansetron  (ZOFRAN ) tablet 4 mg  4 mg Oral Q6H PRN Swinteck, Redell, MD       Or   ondansetron  (ZOFRAN ) injection 4 mg  4 mg Intravenous Q6H PRN Swinteck, Redell, MD       polyethylene glycol (MIRALAX  / GLYCOLAX ) packet 17 g  17 g Oral Daily PRN Swinteck, Redell, MD       senna (SENOKOT) tablet 8.6 mg  1 tablet Oral BID Fidel Redell, MD   8.6 mg at 12/28/23 1016   sorbitol  70 % solution 30 mL  30 mL Oral Daily PRN Fidel Redell, MD         Discharge Medications: Please see discharge summary for a list of discharge medications.  Relevant Imaging Results:  Relevant Lab Results:   Additional Information SSN: [id number]  Bridget Cordella Simmonds, LCSW

## 2023-12-28 NOTE — Plan of Care (Signed)
   Problem: Activity: Goal: Risk for activity intolerance will decrease Outcome: Progressing   Problem: Nutrition: Goal: Adequate nutrition will be maintained Outcome: Progressing   Problem: Safety: Goal: Ability to remain free from injury will improve Outcome: Progressing

## 2023-12-29 LAB — CBC
HCT: 27.4 % — ABNORMAL LOW (ref 39.0–52.0)
Hemoglobin: 8.8 g/dL — ABNORMAL LOW (ref 13.0–17.0)
MCH: 29.1 pg (ref 26.0–34.0)
MCHC: 32.1 g/dL (ref 30.0–36.0)
MCV: 90.7 fL (ref 80.0–100.0)
Platelets: 232 K/uL (ref 150–400)
RBC: 3.02 MIL/uL — ABNORMAL LOW (ref 4.22–5.81)
RDW: 14 % (ref 11.5–15.5)
WBC: 11.6 K/uL — ABNORMAL HIGH (ref 4.0–10.5)
nRBC: 0 % (ref 0.0–0.2)

## 2023-12-29 LAB — BASIC METABOLIC PANEL WITH GFR
Anion gap: 9 (ref 5–15)
BUN: 19 mg/dL (ref 8–23)
CO2: 29 mmol/L (ref 22–32)
Calcium: 8.7 mg/dL — ABNORMAL LOW (ref 8.9–10.3)
Chloride: 108 mmol/L (ref 98–111)
Creatinine, Ser: 0.86 mg/dL (ref 0.61–1.24)
GFR, Estimated: >60 mL/min (ref >60)
Glucose, Bld: 107 mg/dL — ABNORMAL HIGH (ref 70–99)
Potassium: 4.6 mmol/L (ref 3.5–5.1)
Sodium: 146 mmol/L — ABNORMAL HIGH (ref 135–145)

## 2023-12-29 NOTE — Progress Notes (Signed)
 [facility] 10 [facility] Liaison Note:  This is a current GUIDE and outpatient-based palliative care patient with [facility]. [facility] will continue to follow for discharge disposition. Please call with any questions or concerns.  Eleanor Nail, LPN [facility] Liaison [phone]

## 2023-12-29 NOTE — Plan of Care (Signed)
°  Problem: Clinical Measurements: Goal: Cardiovascular complication will be avoided Outcome: Progressing   Problem: Elimination: Goal: Will not experience complications related to bowel motility Outcome: Progressing   Problem: Education: Goal: Knowledge of General Education information will improve Description: Including pain rating scale, medication(s)/side effects and non-pharmacologic comfort measures Outcome: Not Progressing   Problem: Health Behavior/Discharge Planning: Goal: Ability to manage health-related needs will improve Outcome: Not Progressing   Problem: Activity: Goal: Risk for activity intolerance will decrease Outcome: Not Progressing

## 2023-12-29 NOTE — Progress Notes (Signed)
 PROGRESS NOTE    Jonathan Berry  FMW:[id number] DOB: [birth year] DOA: 12/24/2023 PCP: Kirby Shove, MD  Subjective: No acute events overnight. Seen and examined at bedside with wife present. Unable to provide much history due to baseline dementia.    Hospital Course:  78 y.o. male with medical history significant of hypertension, hyperlipidemia, dementia, Parkinson's who presents emergency department after mechanical fall.  Patient lives at memory care unit was found to be limping.  They checked an x-ray was found of a pelvic fracture.  Was brought to the ER.  He is on Eliquis .  Patient was unable to provide much history.  Orthopedics was consulted with underwent L hip hemiarthroplasty on 12/13    Assessment and Plan:  Acute Traumatic Left femoral neck fracture - due to Mechanical fall - s/p L hip hemiarthroplasty on 12/13 - pain control - cont apixaban  - PT/OT following, needs rehab - Orthopedic surg following    History of unprovoked submassive PE with cor pulmonale - in 2022  - cont apixaban    History of seizure -continue Vimpat    Dementia Parkinson's - Lives at memory care unit -continue Aricept  - monitor on fall and delirium precautions   Chronic Dysphagia - as per family, on mechanical soft diet, nectar thick liquids as outpatient - cont on mechanical soft, nectar thick diet - SLP recs appreciated   Depression -continue Zoloft   DVT prophylaxis: SCDs Start: 12/26/23 1215 apixaban  (ELIQUIS ) tablet 5 mg  Eliquis    Code Status: Limited: Do not attempt resuscitation (DNR) -DNR-LIMITED -Do Not Intubate/DNI  Family Communication: updated wife at bedside Disposition Plan: SNF Reason for continuing need for hospitalization: medically ready, SNF placement pending, SW following  Objective: Vitals:   12/28/23 1900 12/29/23 0506 12/29/23 0851 12/29/23 1331  BP: (!) 143/95 135/87 (!) 125/109 (!) 120/52  Pulse: 84 74 75 77  Resp: 17 15 18 18   Temp: 97.8 F (36.6 C) 98.1  F (36.7 C) 98.7 F (37.1 C) 99.2 F (37.3 C)  TempSrc:      SpO2: 94% 100% 100% 100%  Weight:      Height:        Intake/Output Summary (Last 24 hours) at 12/29/2023 1359 Last data filed at 12/29/2023 0906 Gross per 24 hour  Intake 540 ml  Output --  Net 540 ml   Filed Weights   12/26/23 0641  Weight: 68 kg    Examination:  Physical Exam Vitals and nursing note reviewed.  Constitutional:      General: He is not in acute distress.    Appearance: He is ill-appearing.     Comments: Weak, frail  HENT:     Head: Normocephalic and atraumatic.  Cardiovascular:     Rate and Rhythm: Normal rate and regular rhythm.     Pulses: Normal pulses.     Heart sounds: Normal heart sounds.  Pulmonary:     Effort: Pulmonary effort is normal.     Breath sounds: Normal breath sounds.  Abdominal:     General: Bowel sounds are normal.     Palpations: Abdomen is soft.  Neurological:     Mental Status: He is alert. Mental status is at baseline. He is disoriented.     Data Reviewed: I have personally reviewed following labs and imaging studies  CBC: Recent Labs  Lab 12/24/23 2125 12/25/23 0333 12/27/23 0319 12/28/23 0551 12/29/23 0414  WBC 13.9* 12.1* 12.5* 12.3* 11.6*  HGB 11.2* 10.7* 9.7* 9.3* 8.8*  HCT 33.5* 32.0* 28.0* 28.4* 27.4*  MCV  90.8 88.9 88.3 89.9 90.7  PLT 239 221 221 205 232   Basic Metabolic Panel: Recent Labs  Lab 12/24/23 2125 12/25/23 0333 12/27/23 0319 12/28/23 0551 12/29/23 0414  NA 140 139 140 143 146*  K 3.9 3.7 4.2 4.2 4.6  CL 105 106 102 109 108  CO2 24 25 26 25 29   GLUCOSE 111* 108* 124* 114* 107*  BUN 25* 22 16 17 19   CREATININE 0.86 0.88 0.99 1.03 0.86  CALCIUM  8.1* 8.0* 8.5* 8.5* 8.7*   GFR: Estimated Creatinine Clearance: 66.2 mL/min (by C-G formula based on SCr of 0.86 mg/dL). Liver Function Tests: No results for input(s): AST, ALT, ALKPHOS, BILITOT, PROT, ALBUMIN  in the last 168 hours. No results for input(s):  LIPASE, AMYLASE in the last 168 hours. No results for input(s): AMMONIA in the last 168 hours. Coagulation Profile: Recent Labs  Lab 12/25/23 0333  INR 1.2   Cardiac Enzymes: No results for input(s): CKTOTAL, CKMB, CKMBINDEX, TROPONINI in the last 168 hours. ProBNP, BNP (last 5 results) No results for input(s): PROBNP, BNP in the last 8760 hours. HbA1C: No results for input(s): HGBA1C in the last 72 hours. CBG: Recent Labs  Lab 12/26/23 2139 12/27/23 1133 12/27/23 2119 12/28/23 1329  GLUCAP 121* 121* 120* 123*   Lipid Profile: No results for input(s): CHOL, HDL, LDLCALC, TRIG, CHOLHDL, LDLDIRECT in the last 72 hours. Thyroid Function Tests: No results for input(s): TSH, T4TOTAL, FREET4, T3FREE, THYROIDAB in the last 72 hours. Anemia Panel: No results for input(s): VITAMINB12, FOLATE, FERRITIN, TIBC, IRON, RETICCTPCT in the last 72 hours. Sepsis Labs: No results for input(s): PROCALCITON, LATICACIDVEN in the last 168 hours.  Recent Results (from the past 240 hours)  Surgical PCR screen     Status: None   Collection Time: 12/25/23  9:15 PM   Specimen: Nasal Mucosa; Nasal Swab  Result Value Ref Range Status   MRSA, PCR NEGATIVE NEGATIVE Final   Staphylococcus aureus NEGATIVE NEGATIVE Final    Comment: (NOTE) The Xpert SA Assay (FDA approved for NASAL specimens in patients 57 years of age and older), is one component of a comprehensive surveillance program. It is not intended to diagnose infection nor to guide or monitor treatment. Performed at [facility], [address], [place], [place] [address]      Radiology Studies: No results found.  Scheduled Meds:  apixaban   5 mg Oral BID   docusate sodium   100 mg Oral BID   donepezil   10 mg Oral QHS   lacosamide   50 mg Oral BID   mupirocin  ointment  1 Application Nasal BID   senna  1 tablet Oral BID   Continuous Infusions:   LOS: 4 days   Norval Bar, MD  [facility]  12/29/2023, 1:59 PM

## 2023-12-29 NOTE — TOC Progression Note (Addendum)
 Transition of Care [facility]) - Progression Note    Patient Details  Name: Jonathan Berry MRN: [id number] Date of Birth: 1945/08/09  [facility] CM/SW Contact  Bridget Cordella Simmonds, LCSW Phone Number: 12/29/2023, 8:54 AM  Clinical Narrative:   Bed offers provided to wife [name].  She is requesting response from [facility].  CSW reached out to that facility.  1100: [facility] unclear on when next bed will be available, could be multiple days.  CSW updated pt wife, she will review other offers.   1330: CSW spoke with [name]: she will accept offer at [facility].  CSW message from [facility]: no beds available next few days, first available right now would be Friday.  [name] updated, will look at her other offers.    Expected Discharge Plan: Skilled Nursing Facility Barriers to Discharge: SNF Pending bed offer               Expected Discharge Plan and Services In-house Referral: Clinical Social Work   Post Acute Care Choice: Skilled Nursing Facility Living arrangements for the past 2 months: Assisted Living Facility ([facility])                                       Social Drivers of Health (SDOH) Interventions SDOH Screenings   Food Insecurity: Patient Unable To Answer (12/26/2023)  Housing: Patient Unable To Answer (12/26/2023)  Transportation Needs: Patient Unable To Answer (12/26/2023)  Utilities: Patient Unable To Answer (12/26/2023)  Social Connections: Patient Unable To Answer (12/26/2023)  Tobacco Use: Low Risk (12/26/2023)    Readmission Risk Interventions     No data to display

## 2023-12-30 ENCOUNTER — Encounter (HOSPITAL_COMMUNITY): Payer: Self-pay | Admitting: Emergency Medicine

## 2023-12-30 ENCOUNTER — Emergency Department (HOSPITAL_COMMUNITY)
Admission: EM | Admit: 2023-12-30 | Discharge: 2023-12-31 | Disposition: A | Discharge status: Home / Self Care | Attending: Emergency Medicine | Admitting: Emergency Medicine

## 2023-12-30 ENCOUNTER — Other Ambulatory Visit: Payer: Self-pay

## 2023-12-30 ENCOUNTER — Emergency Department (HOSPITAL_COMMUNITY)

## 2023-12-30 DIAGNOSIS — I10 Essential (primary) hypertension: Secondary | ICD-10-CM | POA: Insufficient documentation

## 2023-12-30 DIAGNOSIS — Z79899 Other long term (current) drug therapy: Secondary | ICD-10-CM | POA: Insufficient documentation

## 2023-12-30 DIAGNOSIS — Z7901 Long term (current) use of anticoagulants: Secondary | ICD-10-CM | POA: Insufficient documentation

## 2023-12-30 DIAGNOSIS — G20A1 Parkinson's disease without dyskinesia, without mention of fluctuations: Secondary | ICD-10-CM | POA: Insufficient documentation

## 2023-12-30 DIAGNOSIS — D72819 Decreased white blood cell count, unspecified: Secondary | ICD-10-CM | POA: Insufficient documentation

## 2023-12-30 DIAGNOSIS — F0390 Unspecified dementia without behavioral disturbance: Secondary | ICD-10-CM | POA: Insufficient documentation

## 2023-12-30 DIAGNOSIS — N3090 Cystitis, unspecified without hematuria: Secondary | ICD-10-CM | POA: Diagnosis not present

## 2023-12-30 LAB — CBC WITH DIFFERENTIAL/PLATELET
Abs Immature Granulocytes: 0.05 K/uL (ref 0.00–0.07)
Basophils Absolute: 0.1 K/uL (ref 0.0–0.1)
Basophils Relative: 1 %
Eosinophils Absolute: 0.4 K/uL (ref 0.0–0.5)
Eosinophils Relative: 4 %
HCT: 27.4 % — ABNORMAL LOW (ref 39.0–52.0)
Hemoglobin: 9 g/dL — ABNORMAL LOW (ref 13.0–17.0)
Immature Granulocytes: 1 %
Lymphocytes Relative: 13 %
Lymphs Abs: 1.4 K/uL (ref 0.7–4.0)
MCH: 29.6 pg (ref 26.0–34.0)
MCHC: 32.8 g/dL (ref 30.0–36.0)
MCV: 90.1 fL (ref 80.0–100.0)
Monocytes Absolute: 1.2 K/uL — ABNORMAL HIGH (ref 0.1–1.0)
Monocytes Relative: 11 %
Neutro Abs: 7.8 K/uL — ABNORMAL HIGH (ref 1.7–7.7)
Neutrophils Relative %: 70 %
Platelets: 321 K/uL (ref 150–400)
RBC: 3.04 MIL/uL — ABNORMAL LOW (ref 4.22–5.81)
RDW: 13.4 % (ref 11.5–15.5)
WBC: 10.9 K/uL — ABNORMAL HIGH (ref 4.0–10.5)
nRBC: 0 % (ref 0.0–0.2)

## 2023-12-30 LAB — BASIC METABOLIC PANEL WITH GFR
Anion gap: 9 (ref 5–15)
BUN: 23 mg/dL (ref 8–23)
CO2: 30 mmol/L (ref 22–32)
Calcium: 9.1 mg/dL (ref 8.9–10.3)
Chloride: 105 mmol/L (ref 98–111)
Creatinine, Ser: 0.71 mg/dL (ref 0.61–1.24)
GFR, Estimated: >60 mL/min (ref >60)
Glucose, Bld: 99 mg/dL (ref 70–99)
Potassium: 4 mmol/L (ref 3.5–5.1)
Sodium: 143 mmol/L (ref 135–145)

## 2023-12-30 LAB — COMPREHENSIVE METABOLIC PANEL WITH GFR
ALT: 36 U/L (ref 0–44)
AST: 52 U/L — ABNORMAL HIGH (ref 15–41)
Albumin: 3.2 g/dL — ABNORMAL LOW (ref 3.5–5.0)
Alkaline Phosphatase: 77 U/L (ref 38–126)
Anion gap: 11 (ref 5–15)
BUN: 26 mg/dL — ABNORMAL HIGH (ref 8–23)
CO2: 27 mmol/L (ref 22–32)
Calcium: 9.1 mg/dL (ref 8.9–10.3)
Chloride: 104 mmol/L (ref 98–111)
Creatinine, Ser: 0.76 mg/dL (ref 0.61–1.24)
GFR, Estimated: >60 mL/min (ref >60)
Glucose, Bld: 115 mg/dL — ABNORMAL HIGH (ref 70–99)
Potassium: 4.6 mmol/L (ref 3.5–5.1)
Sodium: 142 mmol/L (ref 135–145)
Total Bilirubin: 0.5 mg/dL (ref 0.0–1.2)
Total Protein: 6.1 g/dL — ABNORMAL LOW (ref 6.5–8.1)

## 2023-12-30 LAB — URINALYSIS, ROUTINE W REFLEX MICROSCOPIC
Bilirubin Urine: NEGATIVE
Glucose, UA: NEGATIVE mg/dL
Hgb urine dipstick: NEGATIVE
Ketones, ur: NEGATIVE mg/dL
Nitrite: NEGATIVE
Protein, ur: 30 mg/dL — AB
Specific Gravity, Urine: 1.023 (ref 1.005–1.030)
WBC, UA: >50 WBC/hpf (ref 0–5)
pH: 5 (ref 5.0–8.0)

## 2023-12-30 LAB — RESP PANEL BY RT-PCR (RSV, FLU A&B, COVID)  RVPGX2
Influenza A by PCR: NEGATIVE
Influenza B by PCR: NEGATIVE
Resp Syncytial Virus by PCR: NEGATIVE
SARS Coronavirus 2 by RT PCR: NEGATIVE

## 2023-12-30 LAB — I-STAT CG4 LACTIC ACID, ED
Lactic Acid, Venous: 1.4 mmol/L (ref 0.5–1.9)
Lactic Acid, Venous: 0.7 mmol/L (ref 0.5–1.9)

## 2023-12-30 MED ORDER — DOCUSATE SODIUM 100 MG PO CAPS: 100.0000 mg | ORAL_CAPSULE | Freq: Two times a day (BID) | ORAL | Status: AC

## 2023-12-30 MED ORDER — IOHEXOL 350 MG/ML SOLN
75.0000 mL | Freq: Once | INTRAVENOUS | Status: AC | PRN
  Administered 2023-12-30: 21:00:00 75 mL via INTRAVENOUS

## 2023-12-30 MED ORDER — POLYETHYLENE GLYCOL 3350 17 G PO PACK: 17.0000 g | PACK | Freq: Every day | ORAL | Status: AC | PRN

## 2023-12-30 MED ORDER — VANCOMYCIN HCL 1250 MG/250ML IV SOLN
1250.0000 mg | Freq: Once | INTRAVENOUS | Status: AC
  Administered 2023-12-30: 20:00:00 1250 mg via INTRAVENOUS
  Filled 2023-12-30: qty 250

## 2023-12-30 MED ORDER — SENNA 8.6 MG PO TABS: 1.0000 | ORAL_TABLET | Freq: Every evening | ORAL | Status: AC | PRN

## 2023-12-30 MED ORDER — PIPERACILLIN-TAZOBACTAM 3.375 G IVPB
3.3750 g | Freq: Three times a day (TID) | INTRAVENOUS | Status: DC
  Administered 2023-12-30: 17:00:00 3.375 g via INTRAVENOUS
  Filled 2023-12-30: qty 50

## 2023-12-30 MED ORDER — SODIUM CHLORIDE 0.9 % IV BOLUS
1000.0000 mL | Freq: Once | INTRAVENOUS | Status: AC
  Administered 2023-12-30: 19:00:00 1000 mL via INTRAVENOUS

## 2023-12-30 MED ORDER — ACETAMINOPHEN 650 MG RE SUPP
650.0000 mg | Freq: Once | RECTAL | Status: AC
  Administered 2023-12-30: 16:00:00 650 mg via RECTAL
  Filled 2023-12-30: qty 1

## 2023-12-30 MED ORDER — ACETAMINOPHEN 650 MG RE SUPP
650.0000 mg | Freq: Once | RECTAL | Status: AC
  Administered 2023-12-30: 23:00:00 650 mg via RECTAL
  Filled 2023-12-30: qty 1

## 2023-12-30 NOTE — Plan of Care (Signed)
   Problem: Health Behavior/Discharge Planning: Goal: Ability to manage health-related needs will improve Outcome: Progressing   Problem: Clinical Measurements: Goal: Ability to maintain clinical measurements within normal limits will improve Outcome: Progressing Goal: Will remain free from infection Outcome: Progressing

## 2023-12-30 NOTE — Discharge Summary (Signed)
 Physician Discharge Summary   Patient: Jonathan Berry MRN: [id number] DOB: 09-02-1945  Admit date:     12/24/2023  Discharge date: 12/30/2023  Discharge Physician: Duffy Al-Sultani   PCP: Kirby Shove, MD   Recommendations at discharge:   Follow up with PCP with 1-2 weeks of discharge Follow up with ortho as scheduled for post-op evaluation  Discharge Diagnoses: Principal Problem:   Femoral neck fracture (HCC)  Resolved Problems:   * No resolved hospital problems. *   Hospital Course: 78 y.o. male with medical history significant of hypertension, hyperlipidemia, dementia, Parkinson's who presents emergency department after mechanical fall.  Patient lives at memory care unit was found to be limping. An x-ray was showed a pelvic fracture. Was brought to the ER.  Orthopedics was consulted with underwent L hip hemiarthroplasty on 12/13. PT/OT recommended SNF for rehab. He was resumed on Eliquis  given history of unprovoked PE. Patient evaluated on 12/17, appropriate for discharge from a medical and surgical standpoint, hemodynamically stable. Discharge discussed with patient's wife at bedside who was in agreement with the discharge plan and verbalized understanding.          Consultants: Orthopedic surgery Procedures performed: Left hip hemiarthroplasty   Disposition: Skilled nursing facility Diet recommendation:  Diet Orders (From admission, onward)     Start     Ordered   12/26/23 1716  DIET DYS 3 Room service appropriate? Yes; Fluid consistency: Nectar Thick  Diet effective now       Question Answer Comment  Room service appropriate? Yes   Fluid consistency: Nectar Thick      12/26/23 1715            DISCHARGE MEDICATION: Allergies as of 12/30/2023   No Known Allergies      Medication List     TAKE these medications    acetaminophen  500 MG tablet Commonly known as: TYLENOL  Take 500 mg by mouth every 6 (six) hours as needed for mild pain, moderate pain or  fever.   apixaban  5 MG Tabs tablet Commonly known as: ELIQUIS  Take 1 tablet (5 mg total) by mouth 2 (two) times daily.   docusate sodium  100 MG capsule Commonly known as: COLACE Take 1 capsule (100 mg total) by mouth 2 (two) times daily.   donepezil  10 MG tablet Commonly known as: ARICEPT  Take 10 mg by mouth at bedtime.   HYDROcodone -acetaminophen  5-325 MG tablet Commonly known as: NORCO/VICODIN Take 1-2 tablets by mouth every 4 (four) hours as needed for moderate pain (pain score 4-6).   lacosamide  50 MG Tabs tablet Commonly known as: Vimpat  Take 1 tablet (50 mg total) by mouth 2 (two) times daily.   LORazepam  1 MG tablet Commonly known as: ATIVAN  Take 0.5 tablets (0.5 mg total) by mouth 2 (two) times daily as needed for anxiety.   polyethylene glycol 17 g packet Commonly known as: MIRALAX  / GLYCOLAX  Take 17 g by mouth daily as needed for mild constipation.   Secura Protective 10 % Crea Generic drug: ZINC OXIDE (TOPICAL) Apply 1 Application topically daily as needed (excessive soiling).   senna 8.6 MG Tabs tablet Commonly known as: SENOKOT Take 1 tablet (8.6 mg total) by mouth at bedtime as needed for mild constipation.   sertraline  50 MG tablet Commonly known as: ZOLOFT  Take 50 mg by mouth in the morning.   Systane 0.4-0.3 % Soln Generic drug: Polyethyl Glycol-Propyl Glycol Place 2 drops into both eyes 4 (four) times daily as needed (dry eyes/redness/irritation/discomfort).  Discharge Care Instructions  (From admission, onward)           Start     Ordered   12/30/23 0000  Discharge wound care:       Comments: Per ortho instructions below   12/30/23 1040            Follow-up Information     [place], Valery RAMAN, PA-C. Schedule an appointment as soon as possible for a visit in 2 week(s).   Specialty: Orthopedic Surgery Why: For suture removal Contact information: [address] [place] [address] [phone]          Kirby Shove, MD. Schedule an appointment as soon as possible for a visit in 2 week(s).   Specialty: Family Medicine Contact information: [address] [place] [address] [phone]                 Discharge Exam: Filed Weights   12/26/23 0641  Weight: 68 kg   Blood pressure (!) 171/83, pulse 73, temperature 97.8 F (36.6 C), resp. rate 18, height 5' 7 (1.702 m), weight 68 kg, SpO2 99%.   Gen: NAD, Alert, disoriented at baseline HEENT: NCAT Neck: Supple CV: RRR, no murmurs Resp: normal WOB Abd: Soft, NTND Ext: No LE edema, pulses 2+ b/l, left hip dressing c/d/i Skin: Warm, dry Neuro: No focal deficits Psych: Calm   Condition at discharge: good  The results of significant diagnostics from this hospitalization (including imaging, microbiology, ancillary and laboratory) are listed below for reference.   Imaging Studies: DG Pelvis Portable Result Date: 12/26/2023 EXAM: 1 or 2 VIEW(S) XRAY OF THE PELVIS 12/26/2023 11:19:00 AM COMPARISON: 12/24/2023 CLINICAL HISTORY: Closed displaced fracture of left femoral neck [facility]) [id number] FINDINGS: BONES AND JOINTS: Left hip hemiarthroplasty with cerclage wire fixation in satisfactory position. SOFT TISSUES: Overlying skin staples and soft tissue gas. IMPRESSION: 1. Left hip hemiarthroplasty with cerclage wire fixation in satisfactory position. Electronically signed by: Pinkie Pebbles MD 12/26/2023 05:56 PM EST RP Workstation: [id number]   DG HIP UNILAT WITH PELVIS 1V LEFT Result Date: 12/26/2023 EXAM: FLUOROSCOPIC IMAGING TECHNIQUE: Fluoroscopy was provided by the radiology department for procedure. Radiologist was not present during examination. Fluoroscopy time: 7.2 seconds. RADIATION DOSE INDEX: Reference Air Kerma: 0.52 mGy COMPARISON: None available. CLINICAL HISTORY: Surgery, elective FINDINGS: Intraoperative fluoroscopic imaging was performed during left hip hemiarthroplasty. IMPRESSION: 1.  Intraoperative fluoroscopic imaging as above. Please refer to the operative report for full details. Electronically signed by: Pinkie Pebbles MD 12/26/2023 05:55 PM EST RP Workstation: [id number]   DG C-Arm 1-60 Min-No Report Result Date: 12/26/2023 Fluoroscopy was utilized by the requesting physician.  No radiographic interpretation.   DG C-Arm 1-60 Min-No Report Result Date: 12/26/2023 Fluoroscopy was utilized by the requesting physician.  No radiographic interpretation.   DG Knee Left Port Result Date: 12/24/2023 EXAM: 1 or 2 view(s) Xray of the left knee 12/24/2023 11:05:58 PM COMPARISON: None available. CLINICAL HISTORY: [id number] Closed displaced fracture of left femoral neck (HCC) [id number] Closed displaced fracture of left femoral neck [facility]) [id number] FINDINGS: BONES AND JOINTS: No acute fracture. No malalignment. No significant joint effusion. SOFT TISSUES: The soft tissues are unremarkable. IMPRESSION: 1. No significant abnormality. Electronically signed by: Franky Crease MD 12/24/2023 11:09 PM EST RP Workstation: [id number]   CT Cervical Spine Wo Contrast Result Date: 12/24/2023 EXAM: CT CERVICAL SPINE WITHOUT CONTRAST 12/24/2023 08:55:00 PM TECHNIQUE: CT of the cervical spine was performed without the administration of intravenous contrast. Multiplanar reformatted images are  provided for review. Automated exposure control, iterative reconstruction, and/or weight based adjustment of the mA/kV was utilized to reduce the radiation dose to as low as reasonably achievable. COMPARISON: None available. CLINICAL HISTORY: Ataxia, cervical trauma. FINDINGS: CERVICAL SPINE: BONES AND ALIGNMENT: No acute fracture or traumatic malalignment. DEGENERATIVE CHANGES: Multilevel moderate degenerative changes of the spine. SOFT TISSUES: No prevertebral soft tissue swelling. Atherosclerotic plaque of the carotid arteries within the neck. IMPRESSION: 1. No acute abnormality of the cervical spine. Electronically  signed by: Morgane Naveau MD 12/24/2023 09:06 PM EST RP Workstation: [id number]   CT Head Wo Contrast Result Date: 12/24/2023 EXAM: CT HEAD WITHOUT 12/24/2023 08:55:00 PM TECHNIQUE: CT of the head was performed without the administration of intravenous contrast. Automated exposure control, iterative reconstruction, and/or weight based adjustment of the mA/kV was utilized to reduce the radiation dose to as low as reasonably achievable. COMPARISON: 03/18/2023 CLINICAL HISTORY: Head trauma, intracranial arterial injury suspected. FINDINGS: BRAIN AND VENTRICLES: No acute intracranial hemorrhage. No mass effect or midline shift. No extra-axial fluid collection. No evidence of acute infarct. No hydrocephalus. Nonspecific periventricular and subcortical white matter hypoattenuation, likely chronic microvascular ischemic changes. Cerebral ventricle sizes are concordant with the degree of cerebral volume loss. ORBITS: No acute abnormality. SINUSES AND MASTOIDS: No acute abnormality. SOFT TISSUES AND SKULL: No acute skull fracture. No acute soft tissue abnormality. IMPRESSION: 1. No acute intracranial abnormality. Electronically signed by: Morgane Naveau MD 12/24/2023 09:04 PM EST RP Workstation: [id number]   DG Chest Portable 1 View Result Date: 12/24/2023 EXAM: 1 VIEW(S) XRAY OF THE CHEST 12/24/2023 08:22:00 PM COMPARISON: 10/09/2023 CLINICAL HISTORY: Fall FINDINGS: LUNGS AND PLEURA: No focal pulmonary opacity. No pleural effusion. No pneumothorax. HEART AND MEDIASTINUM: Overlapping cardiac leads. Calcified aorta. BONES AND SOFT TISSUES: No acute osseous abnormality. IMPRESSION: 1. No acute cardiopulmonary process. Electronically signed by: Morgane Naveau MD 12/24/2023 08:31 PM EST RP Workstation: [id number]   DG Pelvis Portable Result Date: 12/24/2023 EXAM: 1 or 2 VIEW(S) XRAY OF THE PELVIS 12/24/2023 08:22:00 PM COMPARISON: X-ray abdomen 03/21/2023, CT pelvis 09/15/2021. CLINICAL HISTORY: Fall. FINDINGS: BONES  AND JOINTS: Acute displaced and impacted left femoral neck fracture. SOFT TISSUES: The soft tissues are unremarkable. IMPRESSION: 1. Acute displaced and impacted left femoral neck fracture. Electronically signed by: Morgane Naveau MD 12/24/2023 08:27 PM EST RP Workstation: [id number]    Microbiology: Results for orders placed or performed during the hospital encounter of 12/24/23  Surgical PCR screen     Status: None   Collection Time: 12/25/23  9:15 PM   Specimen: Nasal Mucosa; Nasal Swab  Result Value Ref Range Status   MRSA, PCR NEGATIVE NEGATIVE Final   Staphylococcus aureus NEGATIVE NEGATIVE Final    Comment: (NOTE) The Xpert SA Assay (FDA approved for NASAL specimens in patients 47 years of age and older), is one component of a comprehensive surveillance program. It is not intended to diagnose infection nor to guide or monitor treatment. Performed at [facility], [address], [place], [place] [address]     Labs: CBC: Recent Labs  Lab 12/24/23 2125 12/25/23 0333 12/27/23 0319 12/28/23 0551 12/29/23 0414  WBC 13.9* 12.1* 12.5* 12.3* 11.6*  HGB 11.2* 10.7* 9.7* 9.3* 8.8*  HCT 33.5* 32.0* 28.0* 28.4* 27.4*  MCV 90.8 88.9 88.3 89.9 90.7  PLT 239 221 221 205 232   Basic Metabolic Panel: Recent Labs  Lab 12/25/23 0333 12/27/23 0319 12/28/23 0551 12/29/23 0414 12/30/23 0849  NA 139 140 143 146* 143  K 3.7 4.2 4.2  4.6 4.0  CL 106 102 109 108 105  CO2 25 26 25 29 30   GLUCOSE 108* 124* 114* 107* 99  BUN 22 16 17 19 23   CREATININE 0.88 0.99 1.03 0.86 0.71  CALCIUM  8.0* 8.5* 8.5* 8.7* 9.1   Liver Function Tests: No results for input(s): AST, ALT, ALKPHOS, BILITOT, PROT, ALBUMIN  in the last 168 hours. CBG: Recent Labs  Lab 12/26/23 2139 12/27/23 1133 12/27/23 2119 12/28/23 1329  GLUCAP 121* 121* 120* 123*    Discharge time spent: Time Coordinating Discharge: I spent a total of 35 minutes engaged in face-to-face discussion with the  patient and/or caregivers regarding the patients care, assessment, plan, and discharge disposition. Over 50% of this time was dedicated to counseling the patient on the risks and benefits of treatment options and the discharge plan, as well as coordinating post-discharge care.   Signed: Candelario Steppe Al-Sultani, MD [facility] 12/30/2023

## 2023-12-30 NOTE — TOC Transition Note (Signed)
 Transition of Care [facility]) - Discharge Note   Patient Details  Name: Jonathan Berry MRN: [id number] Date of Birth: 1945-03-17  Transition of Care [facility]) CM/SW Contact:  Bridget Cordella Simmonds, LCSW Phone Number: 12/30/2023, 11:11 AM   Clinical Narrative:   Pt discharging to [facility]. RN call report to [phone].   [facility] called [phone].    0900: Message from [facility]: they do have bed available today, semi private room.  CSW spoke with pt wife [name], she will accept.  Medicare payer with inpt order 12/25/23.  Final next level of care: Skilled Nursing Facility Barriers to Discharge: Barriers Resolved   Patient Goals and CMS Choice   CMS Medicare.gov Compare Post Acute Care list provided to:: Patient Represenative (must comment) (wife [name]) Choice offered to / list presented to : Spouse      Discharge Placement              Patient [facility] bed at: [facility] Patient to be transferred to facility by: [facility] Name of family member notified: wife [name] Patient and family notified of of transfer: 12/30/23  Discharge Plan and Services Additional resources added to the After Visit Summary for   In-house Referral: Clinical Social Work   Post Acute Care Choice: Skilled Nursing Facility                               Social Drivers of Health (SDOH) Interventions SDOH Screenings   Food Insecurity: Patient Unable To Answer (12/26/2023)  Housing: Patient Unable To Answer (12/26/2023)  Transportation Needs: Patient Unable To Answer (12/26/2023)  Utilities: Patient Unable To Answer (12/26/2023)  Social Connections: Patient Unable To Answer (12/26/2023)  Tobacco Use: Low Risk (12/26/2023)     Readmission Risk Interventions     No data to display

## 2023-12-30 NOTE — ED Triage Notes (Signed)
 Pt BIB [facility] from [facility] due to having a fever. Pt was just discharged from our facility a couple hours ago.   Pt does have dementi; baseline is disoriented X4.   VS BP 130/72, HR 76, SpO2 98% 2L (not baseline), Temp 100.7

## 2023-12-30 NOTE — Discharge Instructions (Addendum)
 It was a pleasure taking care of you today. You were seen in the Emergency Department for of a fever. Your work-up was reassuring. Your CT/Xray/Labs was consistent with an uncomplicated urinary tract infection.  You will need antibiotics for this.  I have sent them to the pharmacy that is on file.  We will arrange for transport back to [facility] for physical therapy.  They should be able to administer the antibiotics at that facility.    Please return to the ER if you experience chest pain, trouble breathing, intractable nausea/vomiting or any other life threatening illnesses.

## 2023-12-30 NOTE — ED Notes (Signed)
 Patient transported to CT

## 2023-12-30 NOTE — ED Provider Notes (Signed)
 " [facility] Provider Note   CSN: [id number] Arrival date & time: 12/30/23  1558     Patient presents with: No chief complaint on file.   Jonathan Berry is a 78 y.o. male with past medical history of hypertension, hyperlipidemia, dementia, Parkinson's who presents emergency department for evaluation of a fever.  Patient was admitted on 12/13 for a femur fracture, which was repaired.  Patient was subsequently discharged from the hospital earlier this morning and sent to a rehab facility.  His wife, who is at bedside and provides the history, states that she was with him until about 12 or 1 PM today.  She states that he was in his usual state of health at his known baseline given his memory impairment, when she received a phone call from the SNF and was told he had a fever.  Patient was brought by EMS, who reported a fever of 100.7.  Per wife, patient does not appear to be more altered than normal and did not have a fever when he was discharged from the hospital this morning.   Evaluation        Prior to Admission medications  Medication Sig Start Date End Date Taking? Authorizing Provider  sulfamethoxazole-trimethoprim (BACTRIM DS) 800-160 MG tablet Take 1 tablet by mouth 2 (two) times daily for 5 days. 12/30/23 01/04/24 Yes Valori Hollenkamp, Marry RAMAN, PA-C  acetaminophen  (TYLENOL ) 500 MG tablet Take 500 mg by mouth every 6 (six) hours as needed for mild pain, moderate pain or fever.    [provider]  apixaban  (ELIQUIS ) 5 MG TABS tablet Take 1 tablet (5 mg total) by mouth 2 (two) times daily. 01/17/21   Patsy Lenis, MD  docusate sodium  (COLACE) 100 MG capsule Take 1 capsule (100 mg total) by mouth 2 (two) times daily. 12/30/23   Al-Sultani, Anmar, MD  donepezil  (ARICEPT ) 10 MG tablet Take 10 mg by mouth at bedtime.    [provider]  HYDROcodone -acetaminophen  (NORCO/VICODIN) 5-325 MG tablet Take 1-2 tablets by mouth every 4 (four)  hours as needed for moderate pain (pain score 4-6). 12/30/23   Al-Sultani, Anmar, MD  lacosamide  (VIMPAT ) 50 MG TABS tablet Take 1 tablet (50 mg total) by mouth 2 (two) times daily. 04/08/21   Patsey Lot, MD  LORazepam  (ATIVAN ) 1 MG tablet Take 0.5 tablets (0.5 mg total) by mouth 2 (two) times daily as needed for anxiety. 03/24/23   Regalado, Belkys A, MD  Polyethyl Glycol-Propyl Glycol (SYSTANE) 0.4-0.3 % SOLN Place 2 drops into both eyes 4 (four) times daily as needed (dry eyes/redness/irritation/discomfort).    [provider]  polyethylene glycol (MIRALAX  / GLYCOLAX ) 17 g packet Take 17 g by mouth daily as needed for mild constipation. 12/30/23   Al-Sultani, Anmar, MD  senna (SENOKOT) 8.6 MG TABS tablet Take 1 tablet (8.6 mg total) by mouth at bedtime as needed for mild constipation. 12/30/23   Al-Sultani, Anmar, MD  sertraline  (ZOLOFT ) 50 MG tablet Take 50 mg by mouth in the morning. 06/04/22   [provider]  ZINC OXIDE, TOPICAL, (SECURA PROTECTIVE) 10 % CREA Apply 1 Application topically daily as needed (excessive soiling).    [provider]    Allergies: Patient has no known allergies.    Review of Systems  Constitutional:  Positive for fever.    Updated Vital Signs BP (!) 112/59   Pulse 63   Temp 100.2 F (37.9 C)   Resp 20   Ht 5' 7 (  1.702 m)   Wt 68 kg   SpO2 97%   BMI 23.48 kg/m   Physical Exam Vitals and nursing note reviewed.  Constitutional:      Appearance: Normal appearance.  HENT:     Head: Normocephalic and atraumatic.     Mouth/Throat:     Mouth: Mucous membranes are moist.  Eyes:     General: No scleral icterus.       Right eye: No discharge.        Left eye: No discharge.     Conjunctiva/sclera: Conjunctivae normal.  Cardiovascular:     Rate and Rhythm: Normal rate and regular rhythm.     Pulses: Normal pulses.  Pulmonary:     Effort: Pulmonary effort is normal.     Breath sounds: Normal breath sounds.  Abdominal:      General: There is no distension.     Tenderness: There is no abdominal tenderness.  Musculoskeletal:        General: Signs of injury present. No deformity.     Cervical back: Normal range of motion.     Comments: Patient has a large nonadhesive bandage to the left anterior hip, secondary to surgical procedure.  Skin:    General: Skin is warm and dry.     Capillary Refill: Capillary refill takes less than 2 seconds.  Neurological:     Mental Status: He is alert. Mental status is at baseline.     Motor: No weakness.     Comments: Patient is disoriented at baseline  Psychiatric:        Mood and Affect: Mood normal.     (all labs ordered are listed, but only abnormal results are displayed) Labs Reviewed  CBC WITH DIFFERENTIAL/PLATELET - Abnormal; Notable for the following components:      Result Value   WBC 10.9 (*)    RBC 3.04 (*)    Hemoglobin 9.0 (*)    HCT 27.4 (*)    Neutro Abs 7.8 (*)    Monocytes Absolute 1.2 (*)    All other components within normal limits  COMPREHENSIVE METABOLIC PANEL WITH GFR - Abnormal; Notable for the following components:   Glucose, Bld 115 (*)    BUN 26 (*)    Total Protein 6.1 (*)    Albumin  3.2 (*)    AST 52 (*)    All other components within normal limits  URINALYSIS, ROUTINE W REFLEX MICROSCOPIC - Abnormal; Notable for the following components:   APPearance HAZY (*)    Protein, ur 30 (*)    Leukocytes,Ua LARGE (*)    Bacteria, UA RARE (*)    All other components within normal limits  RESP PANEL BY RT-PCR (RSV, FLU A&B, COVID)  [id number]  CULTURE, BLOOD (ROUTINE X 2)  CULTURE, BLOOD (ROUTINE X 2)  I-STAT CG4 LACTIC ACID, ED  I-STAT CG4 LACTIC ACID, ED    EKG: None  Radiology: CT PELVIS W CONTRAST Result Date: 12/30/2023 EXAM: CT PELVIS, WITH IV CONTRAST 12/30/2023 09:00:00 PM TECHNIQUE: Axial images were acquired through the pelvis with IV contrast. Reformatted images were reviewed. Automated exposure control, iterative  reconstruction, and/or weight based adjustment of the mA/kV was utilized to reduce the radiation dose to as low as reasonably achievable. COMPARISON: CT pelvis 09/15/2021. CLINICAL HISTORY: fever, infection suspected FINDINGS: BONES: Left hip total arthroplasty is present. The bones are diffusely osteopenic. Degenerative changes affect the spine. 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 No acute fracture or focal osseous lesion. JOINTS:  Left hip total arthroplasty is present. No dislocation. The joint spaces are normal. SOFT TISSUES: There is intramuscular and deep soft tissue edema and a small amount of air surrounding the left hip compatible with recent surgery. Evaluation of soft tissues in this region is limited secondary to streak artifact. No definitive enhancing fluid collections are seen. VASCULATURE: Atherosclerotic calcifications of the aorta and iliac arteries. INTRAPELVIC CONTENTS: There is a small amount of air in the bladder. The bladder wall is thickened. The rectum is dilated and stool filled measuring 7.7 cm. No other dilated bowel seen. IMPRESSION: 1. Thickened urinary bladder wall with a small amount of intraluminal air. Correlate clinically for cystitis. 2. Dilated, stool-filled rectum measuring 7.7 cm, without other dilated bowel. 3. Intramuscular and deep soft tissue edema with a small amount of air surrounding the left hip, compatible with recent surgery, with evaluation limited by streak artifact. No definite fluid collection identified. Electronically signed by: Greig Pique MD 12/30/2023 09:13 PM EST RP Workstation: [id number]   DG Chest Portable 1 View Result Date: 12/30/2023 CLINICAL DATA:  Fever. EXAM: PORTABLE CHEST 1 VIEW COMPARISON:  Chest in the 12/24/2023. FINDINGS: No focal consolidation, pleural effusion, pneumothorax. Stable cardiac silhouette. No acute osseous pathology. IMPRESSION: No active disease. Electronically Signed   By: Vanetta Chou M.D.   On:  12/30/2023 16:56    .Critical Care  Performed by: Torrence Marry RAMAN, PA-C Authorized by: Torrence Marry RAMAN, PA-C   Critical care provider statement:    Critical care time (minutes):  35   Critical care was necessary to treat or prevent imminent or life-threatening deterioration of the following conditions:  Dehydration and sepsis   Critical care was time spent personally by me on the following activities:  Development of treatment plan with patient or surrogate, discussions with consultants, examination of patient, ordering and review of laboratory studies, ordering and review of radiographic studies, ordering and performing treatments and interventions, pulse oximetry, re-evaluation of patient's condition, review of old charts, blood draw for specimens, discussions with primary provider, evaluation of patient's response to treatment and obtaining history from patient or surrogate   I assumed direction of critical care for this patient from another provider in my specialty: no     Care discussed with: admitting provider      Medications Ordered in the ED  piperacillin -tazobactam (ZOSYN ) IVPB 3.375 g (0 g Intravenous Stopped 12/30/23 2019)  acetaminophen  (TYLENOL ) suppository 650 mg (650 mg Rectal Given 12/30/23 1621)  vancomycin  (VANCOREADY) IVPB 1250 mg/250 mL ( Intravenous Restarted 12/30/23 2118)  sodium chloride  0.9 % bolus 1,000 mL (0 mLs Intravenous Stopped 12/30/23 2021)  iohexol  (OMNIPAQUE ) 350 MG/ML injection 75 mL (75 mLs Intravenous Contrast Given 12/30/23 2106)                                  Medical Decision Making Amount and/or Complexity of Data Reviewed Labs: ordered. Radiology: ordered.  Risk OTC drugs. Prescription drug management.   This patient presents to the ED for concern of fever, this involves an extensive number of treatment options, and is a complaint that carries with it a high risk of complications and morbidity.   Differential diagnosis includes:  Postop fever, sepsis, pneumonia, flu, septic arthritis, intraosseous infection, necrotizing fasciitis  Co morbidities:  dementia, recent surgery, history of PE, Parkinson's, hypertension, hyperlipidemia  Social Determinants of Health:   memory loss, lives in a facility  Additional history:  Patient  was admitted on 12/13 for a left femoral neck fracture and had surgery with Dr. Fidel.  Patient was admitted for 4 days and discharged this morning to a SNF for rehab.  Patient resumed his Eliquis  prior to discharge given his history of an unprovoked PE.  Patient was hemodynamically stable from both a medical and surgical standpoint this morning.  Plan was discussed with patient's wife, who is at bedside.  Lab Tests:  I Ordered, and personally interpreted labs.  The pertinent results include:    - UA with large leukocytes, greater than 50 WBC - Mild leukocytosis of 10.9, not unexpected given recent surgery  Imaging Studies:  I ordered imaging studies including chest x-ray, CT pelvis with contrast I independently visualized and interpreted imaging which showed thickened urinary bladder wall, with recommendation to correlate with clinical symptoms I agree with the radiologist interpretation  Cardiac Monitoring/ECG:  The patient was maintained on a cardiac monitor.  I personally viewed and interpreted the cardiac monitored which showed an underlying rhythm of: Sinus rhythm  Medicines ordered and prescription drug management:  I ordered medication including  Medications  piperacillin -tazobactam (ZOSYN ) IVPB 3.375 g (0 g Intravenous Stopped 12/30/23 2019)  acetaminophen  (TYLENOL ) suppository 650 mg (650 mg Rectal Given 12/30/23 1621)  vancomycin  (VANCOREADY) IVPB 1250 mg/250 mL ( Intravenous Restarted 12/30/23 2118)  sodium chloride  0.9 % bolus 1,000 mL (0 mLs Intravenous Stopped 12/30/23 2021)  iohexol  (OMNIPAQUE ) 350 MG/ML injection 75 mL (75 mLs Intravenous Contrast Given 12/30/23  2106)   for fever and sepsis Reevaluation of the patient after these medicines showed that the patient improved I have reviewed the patients home medicines and have made adjustments as needed  Test Considered:   none  Critical Interventions:  presumed sepsis on arrival   Consultations Obtained: None  Problem List / ED Course:     ICD-10-CM   1. Cystitis  N30.90       MDM: 78 year old male who presents emergency department for evaluation of fever.  Patient underwent left hip hemiarthroplasty on 12/13 and was discharged this morning back to his baseline.  However, fever developed at this afternoon and was 100.7 according to SNF facility, and 101.3 upon patient arrival.  Given patient's inability to provide a history and recent surgery, septic workup started.  Patient's been vancomycin  and Zosyn  for any potential MRSA related infection.  UA is consistent with cystitis and confirmed with CT pelvis that shows bladder wall thickening.  Shared decision making was utilized with patient's wife, who prefers he go back to [facility] for rehab.  I am agreeable to this plan.  I have prescribed Bactrim to treat the patient's UTI and have sent it to his pharmacy.  Repeat temperature is 100 degrees.  I gave another dose of Tylenol  prior to patient's discharge.  Patient's vital signs are stable.  He is appropriate for discharge at this time.   Dispostion:  After consideration of the diagnostic results and the patients response to treatment, I feel that the patient would benefit from antibiotics and supportive care.   Final diagnoses:  Cystitis    ED Discharge Orders          Ordered    sulfamethoxazole-trimethoprim (BACTRIM DS) 800-160 MG tablet  2 times daily        12/30/23 2140               Torrence Marry RAMAN, PA-C 12/30/23 2228    Pamella Ozell LABOR, DO 01/07/24 1244  "

## 2024-01-04 LAB — CULTURE, BLOOD (ROUTINE X 2)
Culture: NO GROWTH
Culture: NO GROWTH
Special Requests: ADEQUATE
Special Requests: ADEQUATE

## 2024-02-14 DEATH — deceased
